# Patient Record
Sex: Female | Born: 1935 | Race: White | Hispanic: No | Smoking: Never smoker
Health system: Southern US, Community
[De-identification: ages and names within clinical notes are randomized; demographics above are authoritative.]

## PROBLEM LIST (undated history)

## (undated) DIAGNOSIS — H9190 Unspecified hearing loss, unspecified ear: Secondary | ICD-10-CM

## (undated) DIAGNOSIS — M8580 Other specified disorders of bone density and structure, unspecified site: Secondary | ICD-10-CM

## (undated) DIAGNOSIS — M1991 Primary osteoarthritis, unspecified site: Secondary | ICD-10-CM

## (undated) DIAGNOSIS — D709 Neutropenia, unspecified: Secondary | ICD-10-CM

## (undated) DIAGNOSIS — E039 Hypothyroidism, unspecified: Secondary | ICD-10-CM

## (undated) DIAGNOSIS — R251 Tremor, unspecified: Secondary | ICD-10-CM

## (undated) HISTORY — DX: Other specified disorders of bone density and structure, unspecified site: M85.80

## (undated) HISTORY — DX: Neutropenia, unspecified: D70.9

## (undated) HISTORY — DX: Unspecified hearing loss, unspecified ear: H91.90

## (undated) HISTORY — DX: Tremor, unspecified: R25.1

## (undated) HISTORY — DX: Primary osteoarthritis, unspecified site: M19.91

## (undated) HISTORY — DX: Hypothyroidism, unspecified: E03.9

---

## 1997-06-10 ENCOUNTER — Other Ambulatory Visit: Admission: RE | Admit: 1997-06-10 | Discharge: 1997-06-10 | Payer: Self-pay | Admitting: *Deleted

## 1997-11-09 ENCOUNTER — Other Ambulatory Visit: Admission: RE | Admit: 1997-11-09 | Discharge: 1997-11-09 | Payer: Self-pay | Admitting: *Deleted

## 1998-08-10 ENCOUNTER — Other Ambulatory Visit: Admission: RE | Admit: 1998-08-10 | Discharge: 1998-08-10 | Payer: Self-pay | Admitting: Endocrinology

## 1999-08-20 ENCOUNTER — Other Ambulatory Visit: Admission: RE | Admit: 1999-08-20 | Discharge: 1999-08-20 | Payer: Self-pay | Admitting: Endocrinology

## 2000-02-15 ENCOUNTER — Encounter: Payer: Self-pay | Admitting: Endocrinology

## 2000-02-15 ENCOUNTER — Ambulatory Visit (HOSPITAL_COMMUNITY): Admission: RE | Admit: 2000-02-15 | Discharge: 2000-02-15 | Payer: Self-pay | Admitting: Endocrinology

## 2004-08-28 ENCOUNTER — Ambulatory Visit: Payer: Self-pay | Admitting: Oncology

## 2004-10-15 ENCOUNTER — Ambulatory Visit (HOSPITAL_COMMUNITY): Admission: RE | Admit: 2004-10-15 | Discharge: 2004-10-15 | Payer: Self-pay | Admitting: *Deleted

## 2005-08-22 ENCOUNTER — Ambulatory Visit: Payer: Self-pay | Admitting: Oncology

## 2005-08-27 LAB — COMPREHENSIVE METABOLIC PANEL
ALT: 9 U/L (ref 0–40)
AST: 18 U/L (ref 0–37)
Albumin: 4.1 g/dL (ref 3.5–5.2)
Alkaline Phosphatase: 65 U/L (ref 39–117)
BUN: 8 mg/dL (ref 6–23)
CO2: 26 mEq/L (ref 19–32)
Calcium: 9.5 mg/dL (ref 8.4–10.5)
Chloride: 100 mEq/L (ref 96–112)
Creatinine, Ser: 0.69 mg/dL (ref 0.40–1.20)
Glucose, Bld: 138 mg/dL — ABNORMAL HIGH (ref 70–99)
Potassium: 4.3 mEq/L (ref 3.5–5.3)
Sodium: 135 mEq/L (ref 135–145)
Total Bilirubin: 0.7 mg/dL (ref 0.3–1.2)
Total Protein: 7.2 g/dL (ref 6.0–8.3)

## 2005-08-27 LAB — CBC WITH DIFFERENTIAL/PLATELET
BASO%: 1.5 % (ref 0.0–2.0)
Basophils Absolute: 0 10*3/uL (ref 0.0–0.1)
EOS%: 2.1 % (ref 0.0–7.0)
Eosinophils Absolute: 0.1 10*3/uL (ref 0.0–0.5)
HCT: 36.1 % (ref 34.8–46.6)
HGB: 12.4 g/dL (ref 11.6–15.9)
LYMPH%: 58.8 % — ABNORMAL HIGH (ref 14.0–48.0)
MCH: 31.9 pg (ref 26.0–34.0)
MCHC: 34.5 g/dL (ref 32.0–36.0)
MCV: 92.3 fL (ref 81.0–101.0)
MONO#: 0.3 10*3/uL (ref 0.1–0.9)
MONO%: 11.9 % (ref 0.0–13.0)
NEUT#: 0.7 10*3/uL — ABNORMAL LOW (ref 1.5–6.5)
NEUT%: 25.7 % — ABNORMAL LOW (ref 39.6–76.8)
Platelets: 313 10*3/uL (ref 145–400)
RBC: 3.91 10*6/uL (ref 3.70–5.32)
RDW: 13.2 % (ref 11.3–14.5)
WBC: 2.7 10*3/uL — ABNORMAL LOW (ref 3.9–10.0)
lymph#: 1.6 10*3/uL (ref 0.9–3.3)

## 2005-08-27 LAB — LACTATE DEHYDROGENASE: LDH: 133 U/L (ref 94–250)

## 2006-08-22 ENCOUNTER — Ambulatory Visit: Payer: Self-pay | Admitting: Oncology

## 2010-05-25 NOTE — Op Note (Signed)
Tanya [name], Tanya [name]                ACCOUNT NO.:  [id number]   MEDICAL RECORD NO.:  [id number]          PATIENT TYPE:  AMB   LOCATION:  ENDO                         FACILITY:  [facility]   PHYSICIAN:  Georgiana Spinner, M.D.    DATE OF BIRTH:  [birth year]   DATE OF PROCEDURE:  10/15/2004  DATE OF DISCHARGE:                                 OPERATIVE REPORT   PROCEDURE:  Colonoscopy.   INDICATIONS FOR PROCEDURE:  Colon cancer screening.   ANESTHESIA:  Fentanyl 50 mcg, Versed 3 mg.   DESCRIPTION OF PROCEDURE:  With the patient mildly sedated in the left  lateral decubitus position, the Olympus videoscopic colonoscope was inserted  in the rectum and passed with some difficulty and shortening of the scope,  we were able to get through a rather diverticular filled tortuous sigmoid  colon. Subsequently, we were able to reach the cecum without pressured  applied to the abdomen. The cecum was identified by the ileocecal valve and  appendiceal orifice both of which were photographed. From this, point, the  colonoscope was slowly withdrawn taking circumferential views of the colonic  mucosa stopping then only when we reached the rectum on pullback which  appeared normal on direct and showed hemorrhoidal tissue on retroflexed  view. The endoscope was straightened and withdrawn. The patient's vital  signs and pulse oximeter remained stable. The patient tolerated the  procedure well without apparent complications.   FINDINGS:  Rather severe diverticulosis with thickening and tortuosity of  the sigmoid colon. Internal hemorrhoid. Otherwise an unremarkable  colonoscopic examination to the cecum.   PLAN:  Consider repeat examination in 5-10 years if indicated.           ______________________________  Georgiana Spinner, M.D.     GMO/MEDQ  D:  10/15/2004  T:  10/15/2004  Job:  [id number]

## 2012-03-27 ENCOUNTER — Telehealth: Payer: Self-pay

## 2012-03-27 NOTE — Telephone Encounter (Signed)
Received labs from Dr Juleen China on this pt. Dr Arline Asp last saw pt 08/27/05 and released pt from care. Faxed labs back with explanation pt released from care.

## 2014-03-14 DIAGNOSIS — Z961 Presence of intraocular lens: Secondary | ICD-10-CM | POA: Diagnosis not present

## 2014-03-14 DIAGNOSIS — H524 Presbyopia: Secondary | ICD-10-CM | POA: Diagnosis not present

## 2014-03-14 DIAGNOSIS — H5213 Myopia, bilateral: Secondary | ICD-10-CM | POA: Diagnosis not present

## 2014-03-14 DIAGNOSIS — H52203 Unspecified astigmatism, bilateral: Secondary | ICD-10-CM | POA: Diagnosis not present

## 2014-04-07 DIAGNOSIS — E0789 Other specified disorders of thyroid: Secondary | ICD-10-CM | POA: Diagnosis not present

## 2014-04-14 DIAGNOSIS — E032 Hypothyroidism due to medicaments and other exogenous substances: Secondary | ICD-10-CM | POA: Diagnosis not present

## 2014-10-06 DIAGNOSIS — E0789 Other specified disorders of thyroid: Secondary | ICD-10-CM | POA: Diagnosis not present

## 2014-10-13 DIAGNOSIS — E039 Hypothyroidism, unspecified: Secondary | ICD-10-CM | POA: Diagnosis not present

## 2014-10-13 DIAGNOSIS — M1990 Unspecified osteoarthritis, unspecified site: Secondary | ICD-10-CM | POA: Diagnosis not present

## 2014-10-13 DIAGNOSIS — D709 Neutropenia, unspecified: Secondary | ICD-10-CM | POA: Diagnosis not present

## 2014-10-13 DIAGNOSIS — Z23 Encounter for immunization: Secondary | ICD-10-CM | POA: Diagnosis not present

## 2014-12-05 DIAGNOSIS — Z0000 Encounter for general adult medical examination without abnormal findings: Secondary | ICD-10-CM | POA: Diagnosis not present

## 2014-12-05 DIAGNOSIS — Z79899 Other long term (current) drug therapy: Secondary | ICD-10-CM | POA: Diagnosis not present

## 2014-12-05 DIAGNOSIS — E0789 Other specified disorders of thyroid: Secondary | ICD-10-CM | POA: Diagnosis not present

## 2014-12-12 DIAGNOSIS — E032 Hypothyroidism due to medicaments and other exogenous substances: Secondary | ICD-10-CM | POA: Diagnosis not present

## 2014-12-12 DIAGNOSIS — M1990 Unspecified osteoarthritis, unspecified site: Secondary | ICD-10-CM | POA: Diagnosis not present

## 2015-05-08 DIAGNOSIS — Z961 Presence of intraocular lens: Secondary | ICD-10-CM | POA: Diagnosis not present

## 2015-06-07 DIAGNOSIS — Z79899 Other long term (current) drug therapy: Secondary | ICD-10-CM | POA: Diagnosis not present

## 2015-06-07 DIAGNOSIS — E0789 Other specified disorders of thyroid: Secondary | ICD-10-CM | POA: Diagnosis not present

## 2015-06-12 DIAGNOSIS — R899 Unspecified abnormal finding in specimens from other organs, systems and tissues: Secondary | ICD-10-CM | POA: Diagnosis not present

## 2015-06-12 DIAGNOSIS — E032 Hypothyroidism due to medicaments and other exogenous substances: Secondary | ICD-10-CM | POA: Diagnosis not present

## 2015-10-02 DIAGNOSIS — Z1231 Encounter for screening mammogram for malignant neoplasm of breast: Secondary | ICD-10-CM | POA: Diagnosis not present

## 2015-12-06 DIAGNOSIS — Z79899 Other long term (current) drug therapy: Secondary | ICD-10-CM | POA: Diagnosis not present

## 2015-12-06 DIAGNOSIS — Z0000 Encounter for general adult medical examination without abnormal findings: Secondary | ICD-10-CM | POA: Diagnosis not present

## 2015-12-06 DIAGNOSIS — E0789 Other specified disorders of thyroid: Secondary | ICD-10-CM | POA: Diagnosis not present

## 2015-12-13 DIAGNOSIS — R899 Unspecified abnormal finding in specimens from other organs, systems and tissues: Secondary | ICD-10-CM | POA: Diagnosis not present

## 2015-12-13 DIAGNOSIS — E032 Hypothyroidism due to medicaments and other exogenous substances: Secondary | ICD-10-CM | POA: Diagnosis not present

## 2016-01-18 DIAGNOSIS — I10 Essential (primary) hypertension: Secondary | ICD-10-CM | POA: Diagnosis not present

## 2016-05-09 DIAGNOSIS — H5310 Unspecified subjective visual disturbances: Secondary | ICD-10-CM | POA: Diagnosis not present

## 2016-05-09 DIAGNOSIS — Z961 Presence of intraocular lens: Secondary | ICD-10-CM | POA: Diagnosis not present

## 2016-06-20 DIAGNOSIS — R51 Headache: Secondary | ICD-10-CM | POA: Diagnosis not present

## 2016-06-20 DIAGNOSIS — I10 Essential (primary) hypertension: Secondary | ICD-10-CM | POA: Diagnosis not present

## 2016-06-20 DIAGNOSIS — E032 Hypothyroidism due to medicaments and other exogenous substances: Secondary | ICD-10-CM | POA: Diagnosis not present

## 2016-07-11 DIAGNOSIS — Z862 Personal history of diseases of the blood and blood-forming organs and certain disorders involving the immune mechanism: Secondary | ICD-10-CM | POA: Diagnosis not present

## 2016-07-11 DIAGNOSIS — M8580 Other specified disorders of bone density and structure, unspecified site: Secondary | ICD-10-CM | POA: Diagnosis not present

## 2016-07-11 DIAGNOSIS — Z7982 Long term (current) use of aspirin: Secondary | ICD-10-CM | POA: Diagnosis not present

## 2016-07-11 DIAGNOSIS — Z23 Encounter for immunization: Secondary | ICD-10-CM | POA: Diagnosis not present

## 2016-07-11 DIAGNOSIS — Z780 Asymptomatic menopausal state: Secondary | ICD-10-CM | POA: Diagnosis not present

## 2016-07-11 DIAGNOSIS — R251 Tremor, unspecified: Secondary | ICD-10-CM | POA: Diagnosis not present

## 2016-07-11 DIAGNOSIS — H905 Unspecified sensorineural hearing loss: Secondary | ICD-10-CM | POA: Diagnosis not present

## 2016-07-11 DIAGNOSIS — E039 Hypothyroidism, unspecified: Secondary | ICD-10-CM | POA: Diagnosis not present

## 2016-07-11 DIAGNOSIS — Z91013 Allergy to seafood: Secondary | ICD-10-CM | POA: Diagnosis not present

## 2016-07-11 DIAGNOSIS — I10 Essential (primary) hypertension: Secondary | ICD-10-CM | POA: Diagnosis not present

## 2016-07-11 DIAGNOSIS — Z833 Family history of diabetes mellitus: Secondary | ICD-10-CM | POA: Diagnosis not present

## 2016-07-11 DIAGNOSIS — Z880 Allergy status to penicillin: Secondary | ICD-10-CM | POA: Diagnosis not present

## 2016-07-11 DIAGNOSIS — M859 Disorder of bone density and structure, unspecified: Secondary | ICD-10-CM | POA: Diagnosis not present

## 2016-07-29 DIAGNOSIS — M8580 Other specified disorders of bone density and structure, unspecified site: Secondary | ICD-10-CM | POA: Diagnosis not present

## 2016-12-12 DIAGNOSIS — E0789 Other specified disorders of thyroid: Secondary | ICD-10-CM | POA: Diagnosis not present

## 2016-12-12 DIAGNOSIS — Z0000 Encounter for general adult medical examination without abnormal findings: Secondary | ICD-10-CM | POA: Diagnosis not present

## 2016-12-19 DIAGNOSIS — E039 Hypothyroidism, unspecified: Secondary | ICD-10-CM | POA: Diagnosis not present

## 2016-12-19 DIAGNOSIS — I10 Essential (primary) hypertension: Secondary | ICD-10-CM | POA: Diagnosis not present

## 2016-12-19 DIAGNOSIS — Z0000 Encounter for general adult medical examination without abnormal findings: Secondary | ICD-10-CM | POA: Diagnosis not present

## 2017-01-15 DIAGNOSIS — D1800 Hemangioma unspecified site: Secondary | ICD-10-CM | POA: Diagnosis not present

## 2017-01-15 DIAGNOSIS — D229 Melanocytic nevi, unspecified: Secondary | ICD-10-CM | POA: Diagnosis not present

## 2017-01-15 DIAGNOSIS — D492 Neoplasm of unspecified behavior of bone, soft tissue, and skin: Secondary | ICD-10-CM | POA: Diagnosis not present

## 2017-01-15 DIAGNOSIS — L821 Other seborrheic keratosis: Secondary | ICD-10-CM | POA: Diagnosis not present

## 2017-06-19 DIAGNOSIS — I10 Essential (primary) hypertension: Secondary | ICD-10-CM | POA: Diagnosis not present

## 2017-06-19 DIAGNOSIS — M25512 Pain in left shoulder: Secondary | ICD-10-CM | POA: Diagnosis not present

## 2017-06-19 DIAGNOSIS — E039 Hypothyroidism, unspecified: Secondary | ICD-10-CM | POA: Diagnosis not present

## 2017-06-25 DIAGNOSIS — M67912 Unspecified disorder of synovium and tendon, left shoulder: Secondary | ICD-10-CM | POA: Diagnosis not present

## 2017-06-25 DIAGNOSIS — M19012 Primary osteoarthritis, left shoulder: Secondary | ICD-10-CM | POA: Diagnosis not present

## 2017-06-25 DIAGNOSIS — M25512 Pain in left shoulder: Secondary | ICD-10-CM | POA: Diagnosis not present

## 2017-08-29 DIAGNOSIS — M545 Low back pain: Secondary | ICD-10-CM | POA: Diagnosis not present

## 2017-08-29 DIAGNOSIS — N390 Urinary tract infection, site not specified: Secondary | ICD-10-CM | POA: Diagnosis not present

## 2017-11-14 DIAGNOSIS — H524 Presbyopia: Secondary | ICD-10-CM | POA: Diagnosis not present

## 2017-11-14 DIAGNOSIS — Z961 Presence of intraocular lens: Secondary | ICD-10-CM | POA: Diagnosis not present

## 2017-11-14 DIAGNOSIS — H52203 Unspecified astigmatism, bilateral: Secondary | ICD-10-CM | POA: Diagnosis not present

## 2017-11-14 DIAGNOSIS — H5211 Myopia, right eye: Secondary | ICD-10-CM | POA: Diagnosis not present

## 2017-12-17 DIAGNOSIS — Z79899 Other long term (current) drug therapy: Secondary | ICD-10-CM | POA: Diagnosis not present

## 2017-12-17 DIAGNOSIS — I10 Essential (primary) hypertension: Secondary | ICD-10-CM | POA: Diagnosis not present

## 2017-12-17 DIAGNOSIS — E0789 Other specified disorders of thyroid: Secondary | ICD-10-CM | POA: Diagnosis not present

## 2017-12-22 DIAGNOSIS — J069 Acute upper respiratory infection, unspecified: Secondary | ICD-10-CM | POA: Diagnosis not present

## 2017-12-25 DIAGNOSIS — I10 Essential (primary) hypertension: Secondary | ICD-10-CM | POA: Diagnosis not present

## 2017-12-25 DIAGNOSIS — Z0000 Encounter for general adult medical examination without abnormal findings: Secondary | ICD-10-CM | POA: Diagnosis not present

## 2017-12-25 DIAGNOSIS — E039 Hypothyroidism, unspecified: Secondary | ICD-10-CM | POA: Diagnosis not present

## 2017-12-25 DIAGNOSIS — J189 Pneumonia, unspecified organism: Secondary | ICD-10-CM | POA: Diagnosis not present

## 2018-01-06 DIAGNOSIS — J159 Unspecified bacterial pneumonia: Secondary | ICD-10-CM | POA: Diagnosis not present

## 2018-01-06 DIAGNOSIS — R05 Cough: Secondary | ICD-10-CM | POA: Diagnosis not present

## 2018-06-15 DIAGNOSIS — E039 Hypothyroidism, unspecified: Secondary | ICD-10-CM | POA: Diagnosis not present

## 2018-06-15 DIAGNOSIS — I10 Essential (primary) hypertension: Secondary | ICD-10-CM | POA: Diagnosis not present

## 2018-06-15 DIAGNOSIS — M19012 Primary osteoarthritis, left shoulder: Secondary | ICD-10-CM | POA: Diagnosis not present

## 2018-06-22 DIAGNOSIS — M19012 Primary osteoarthritis, left shoulder: Secondary | ICD-10-CM | POA: Diagnosis not present

## 2018-08-07 ENCOUNTER — Other Ambulatory Visit: Payer: Self-pay

## 2019-01-07 DIAGNOSIS — I10 Essential (primary) hypertension: Secondary | ICD-10-CM | POA: Diagnosis not present

## 2019-01-07 DIAGNOSIS — E039 Hypothyroidism, unspecified: Secondary | ICD-10-CM | POA: Diagnosis not present

## 2019-01-07 DIAGNOSIS — M8580 Other specified disorders of bone density and structure, unspecified site: Secondary | ICD-10-CM | POA: Diagnosis not present

## 2019-01-14 DIAGNOSIS — Z0000 Encounter for general adult medical examination without abnormal findings: Secondary | ICD-10-CM | POA: Diagnosis not present

## 2019-01-14 DIAGNOSIS — M85851 Other specified disorders of bone density and structure, right thigh: Secondary | ICD-10-CM | POA: Diagnosis not present

## 2019-01-14 DIAGNOSIS — E039 Hypothyroidism, unspecified: Secondary | ICD-10-CM | POA: Diagnosis not present

## 2019-01-14 DIAGNOSIS — L820 Inflamed seborrheic keratosis: Secondary | ICD-10-CM | POA: Diagnosis not present

## 2019-01-14 DIAGNOSIS — M85852 Other specified disorders of bone density and structure, left thigh: Secondary | ICD-10-CM | POA: Diagnosis not present

## 2019-01-14 DIAGNOSIS — M8589 Other specified disorders of bone density and structure, multiple sites: Secondary | ICD-10-CM | POA: Diagnosis not present

## 2019-01-14 DIAGNOSIS — I10 Essential (primary) hypertension: Secondary | ICD-10-CM | POA: Diagnosis not present

## 2019-04-22 DIAGNOSIS — H524 Presbyopia: Secondary | ICD-10-CM | POA: Diagnosis not present

## 2019-04-22 DIAGNOSIS — H5203 Hypermetropia, bilateral: Secondary | ICD-10-CM | POA: Diagnosis not present

## 2019-04-22 DIAGNOSIS — H04123 Dry eye syndrome of bilateral lacrimal glands: Secondary | ICD-10-CM | POA: Diagnosis not present

## 2019-04-22 DIAGNOSIS — H52223 Regular astigmatism, bilateral: Secondary | ICD-10-CM | POA: Diagnosis not present

## 2019-04-22 DIAGNOSIS — Z961 Presence of intraocular lens: Secondary | ICD-10-CM | POA: Diagnosis not present

## 2019-04-22 DIAGNOSIS — H35373 Puckering of macula, bilateral: Secondary | ICD-10-CM | POA: Diagnosis not present

## 2019-04-22 DIAGNOSIS — H543 Unqualified visual loss, both eyes: Secondary | ICD-10-CM | POA: Diagnosis not present

## 2019-04-22 DIAGNOSIS — H1045 Other chronic allergic conjunctivitis: Secondary | ICD-10-CM | POA: Diagnosis not present

## 2019-05-10 ENCOUNTER — Other Ambulatory Visit: Payer: Self-pay | Admitting: Internal Medicine

## 2019-05-10 DIAGNOSIS — G459 Transient cerebral ischemic attack, unspecified: Secondary | ICD-10-CM

## 2019-05-20 ENCOUNTER — Ambulatory Visit
Admission: RE | Admit: 2019-05-20 | Discharge: 2019-05-20 | Disposition: A | Discharge status: Home / Self Care | Payer: PPO | Source: Ambulatory Visit | Attending: Internal Medicine | Admitting: Internal Medicine

## 2019-05-20 DIAGNOSIS — I6782 Cerebral ischemia: Secondary | ICD-10-CM | POA: Diagnosis not present

## 2019-05-20 DIAGNOSIS — G459 Transient cerebral ischemic attack, unspecified: Secondary | ICD-10-CM

## 2019-05-20 DIAGNOSIS — I6523 Occlusion and stenosis of bilateral carotid arteries: Secondary | ICD-10-CM | POA: Diagnosis not present

## 2019-05-25 DIAGNOSIS — H543 Unqualified visual loss, both eyes: Secondary | ICD-10-CM | POA: Diagnosis not present

## 2019-05-25 DIAGNOSIS — H04123 Dry eye syndrome of bilateral lacrimal glands: Secondary | ICD-10-CM | POA: Diagnosis not present

## 2019-05-25 DIAGNOSIS — Z961 Presence of intraocular lens: Secondary | ICD-10-CM | POA: Diagnosis not present

## 2019-06-21 ENCOUNTER — Telehealth: Payer: Self-pay | Admitting: Neurology

## 2019-06-21 ENCOUNTER — Encounter: Payer: Self-pay | Admitting: Neurology

## 2019-06-21 ENCOUNTER — Ambulatory Visit: Payer: PPO | Admitting: Neurology

## 2019-06-21 VITALS — BP 134/70 | HR 75 | Ht 60.0 in | Wt 127.0 lb

## 2019-06-21 DIAGNOSIS — G910 Communicating hydrocephalus: Secondary | ICD-10-CM

## 2019-06-21 DIAGNOSIS — R4701 Aphasia: Secondary | ICD-10-CM

## 2019-06-21 DIAGNOSIS — G459 Transient cerebral ischemic attack, unspecified: Secondary | ICD-10-CM | POA: Diagnosis not present

## 2019-06-21 DIAGNOSIS — G3184 Mild cognitive impairment, so stated: Secondary | ICD-10-CM

## 2019-06-21 MED ORDER — PREVAGEN 10 MG PO CAPS: 1.0000 | ORAL_CAPSULE | Freq: Every morning | ORAL | 1 refills | Status: DC

## 2019-06-21 NOTE — Telephone Encounter (Signed)
health team order sent to GI. No auth they will reach out to the patient to schedule.  

## 2019-06-21 NOTE — Patient Instructions (Signed)
I had a long discussion with the patient and her daughter-in-law regarding her episode of transient speech difficulties and altered awareness and possibly left hemispheric TIA versus complex partial seizure episode and recommend further work-up with checking EEG, MRI scan of the brain, MRA of the brain, lipid profile hemoglobin A1c.  She also has memory difficulties which are likely due to age related mild cognitive impairment and severe check memory panel labs.  Recommend she stay on aspirin for stroke prevention and maintain aggressive risk factor modification with strict control of hypertension with blood pressure goal below 130/90, lipids with LDL cholesterol goal below 70 mg percent and maintain a healthy diet and be active and exercise regularly.  We also discussed memory compensation strategies.  Recommend she start taking Prevagen 10 mg daily to help with memory.  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  I do not believe aggressive testing or treatment for this is necessary at this time.  She will return for follow-up in 2 months or call earlier if necessary, Memory Compensation Strategies  1. Use "WARM" strategy.  W= write it down  A= associate it  R= repeat it  M= make a mental note  2.   You can keep a Social worker.  Use a 3-ring notebook with sections for the following: calendar, important names and phone numbers,  medications, doctors' names/phone numbers, lists/reminders, and a section to journal what you did  each day.   3.    Use a calendar to write appointments down.  4.    Write yourself a schedule for the day.  This can be placed on the calendar or in a separate section of the Memory Notebook.  Keeping a  regular schedule can help memory.  5.    Use medication  organizer with sections for each day or morning/evening pills.  You may need help loading it  6.    Keep a basket, or pegboard by the door.  Place items that you need to take out with you in the basket or on the pegboard.  You may also want to  include a message board for reminders.  7.    Use sticky notes.  Place sticky notes with reminders in a place where the task is performed.  For example: " turn off the  stove" placed by the stove, "lock the door" placed on the door at eye level, " take your medications" on  the bathroom mirror or by the place where you normally take your medications.  8.    Use alarms/timers.  Use while cooking to remind yourself to check on food or as a reminder to take your medicine, or as a  reminder to make a call, or as a reminder to perform another task, etc.

## 2019-06-21 NOTE — Progress Notes (Signed)
[facility] [address] [place]. [place] [address] [phone]       OFFICE CONSULT NOTE  [name] Date of Birth:  04/14/1925 Medical Record Number:  [id number]   Referring MD: [name] Reason for Referral: TIA  HPI: [name] is a pleasant 86 year old Caucasian lady seen today for initial office consultation visit. She is accompanied by her daughter-in-law. History is obtained from them and review of referral notes. I have personally reviewed imaging studies in PACS and electronic medical records. Patient states that on 05/05/2019 she and her granddaughter cleaned out some closets in her house and after that she felt tired and wanted to take a nap in a chair. Her other granddaughter called on the phone and when she was given the phone to speak she had trouble speaking and was talking gibberish and garbled speech. She was also not able to comprehend what granddaughter and daughter-in-law spoke to her. Her symptoms are improving gradually took about 30 minutes. She did not go to the hospital but felt okay for the rest of the day. She was a little slow in thinking for a little while but that improved. She subsequently saw her primary care physician who ordered a CT scan of the head which was done on 05/20/2019 which shows enlarged ventricular size and some periventricular lucencies. 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 There is no acute abnormality. Carotid ultrasound on the same day shows bilateral carotid bifurcation plaques with less than 50% stenosis. Patient denies any known prior history of strokes, TIAs, seizures significant head injury with loss of consciousness. She however does remember his childhood illness possibly diphtheria which she was quite sick and had altered mental status for several days. [place] physician had to go to another city to get her some medications presumably antibiotics which  worked and she gradually got better. She did not have any brain imaging at that time. The patient states she has been having some mild short-term memory difficulties over the years which may be progressing slightly. She does have some sundowning and gets irritated very easily and the daughter-in-law state that she tries to speak to her and get work done mostly in the morning part of the day. Patient denies any symptoms of gait difficulties gait ataxia falls or injuries. She walks quite well with good balance independently and very rarely needs a cane. She is independent and active daily living and recently has stopped driving. She denies any bladder urgency, frequency or incontinence. She has been referred to see neurosurgeon to discuss abnormal CT scan findings and normal pressure hydrocephalus  ROS:   14 system review of systems is positive for speech difficulties, memory loss, confusion, sundowning, irritability, bruising and all other systems negative  PMH:  Past Medical History:  Diagnosis Date  . Hearing loss   . Hypothyroidism   . Neutropenia ([place])   . Osteopenia   . Primary osteoarthritis   . Tremor     Social History:  Social History   Socioeconomic History  . Marital status: Widowed    Spouse name: Not on file  . Number of children: Not on file  . Years of education: Not on file  . Highest education level: Not on file  Occupational History  . Not on file  Tobacco Use  . Smoking status: Never Smoker  . Smokeless tobacco: Never Used  Substance and Sexual Activity  . Alcohol use: Yes  Alcohol/week: 1.0 standard drink    Types: 1 Glasses of wine per week    Comment: bourbon per day one cup prn  . Drug use: Not Currently  . Sexual activity: Not Currently  Other Topics Concern  . Not on file  Social History Narrative  . Not on file   Social Determinants of Health   Financial Resource Strain:   . Difficulty of Paying Living Expenses:   Food Insecurity:   . Worried  About Charity fundraiser in the Last Year:   . Arboriculturist in the Last Year:   Transportation Needs:   . Film/video editor (Medical):   [name] Lack of Transportation (Non-Medical):   Physical Activity:   . Days of Exercise per Week:   . Minutes of Exercise per Session:   Stress:   . Feeling of Stress :   Social Connections:   . Frequency of Communication with Friends and Family:   . Frequency of Social Gatherings with Friends and Family:   . Attends Religious Services:   . Active Member of Clubs or Organizations:   . Attends Archivist Meetings:   [name] Marital Status:   Intimate Partner Violence:   . Fear of Current or Ex-Partner:   . Emotionally Abused:   [name] Physically Abused:   . Sexually Abused:     Medications:   No current outpatient medications on file prior to visit.   No current facility-administered medications on file prior to visit.    Allergies:   Allergies  Allergen Reactions  . Codeine Sulfate [Codeine]   . Shellfish Allergy   . Amoxicillin Itching and Rash    Physical Exam General: Frail elderly Caucasian lady, seated, in no evident distress Head: head normocephalic and atraumatic.   Neck: supple with no carotid or supraclavicular bruits Cardiovascular: regular rate and rhythm, no murmurs Musculoskeletal: no deformity Skin:  no rash few scattered petichiae Vascular:  Normal pulses all extremities  Neurologic Exam Mental Status: Awake and fully alert. Oriented to place and time. Recent and remote memory diminished. Attention span, concentration and fund of knowledge diminished mood and affect appropriate. Recall 1/3. Able to name 10 animals that can walk on 4 legs. Clock drawing 4/4. Able to copy intersecting pentagons well. Cranial Nerves: Fundoscopic exam reveals sharp disc margins. Pupils equal, briskly reactive to light. Extraocular movements full without nystagmus. Visual fields full to confrontation. Hearing diminished bilaterally  despite hearing aid. Facial sensation intact. Face, tongue, palate moves normally and symmetrically.  Motor: Normal bulk and tone. Normal strength in all tested extremity muscles. Sensory.: intact to touch , pinprick , position and vibratory sensation.  Coordination: Rapid alternating movements normal in all extremities. Finger-to-nose and heel-to-shin performed accurately bilaterally. Gait and Station: Arises from chair without difficulty. Stance is normal. Gait demonstrates normal stride length and balance . Able to heel, toe and tandem walk with moderate  difficulty.  Reflexes: 1+ and symmetric. Toes downgoing.   NIHSS 0 Modified Rankin  1   ASSESSMENT: 86 year old pleasant Caucasian lady with transient episode of speech and language difficulties with altered awareness and April 2021 of unclear etiology possibilities include left hemispheric TIA versus complex partial seizure. 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 Her clinical symptomatology does not favor normal pressure hydrocephalus. She also has age-appropriate mild cognitive impairment.     PLAN: I had a long discussion with the  patient and her daughter-in-law regarding her episode of transient speech difficulties and altered awareness and possibly left hemispheric TIA versus complex partial seizure episode and recommend further work-up with checking EEG, MRI scan of the brain, MRA of the brain, lipid profile hemoglobin A1c.  She also has memory difficulties which are likely due to age related mild cognitive impairment and severe check memory panel labs.  Recommend she stay on aspirin for stroke prevention and maintain aggressive risk factor modification with strict control of hypertension with blood pressure goal below 130/90, lipids with LDL cholesterol goal below 70 mg percent and maintain a healthy  diet and be active and exercise regularly.  We also discussed memory compensation strategies.  Recommend she start taking Prevagen 10 mg daily to help with memory.  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  I do not believe aggressive testing or treatment for this is necessary at this time. This was a prolonged 60-minute consultation visit with greater than 50% time spent in counseling and coordination of care about mild cognitive impairment, TIA versus stroke episode and discussion about normal pressure hydrocephalus and answering questions she will return for follow-up in 2 months or call earlier if necessary, Antony Contras, MD  [facility] [address] [place], [place] [address]  Phone [phone] Fax [phone] Note: This document was prepared with digital dictation and possible smart phrase technology. Any transcriptional errors that result from this process are unintentional.

## 2019-06-22 LAB — DEMENTIA PANEL
Homocysteine: 20 umol/L (ref 0.0–21.3)
RPR Ser Ql: NONREACTIVE
TSH: 1.74 u[IU]/mL (ref 0.450–4.500)
Vitamin B-12: 185 pg/mL — ABNORMAL LOW (ref 232–1245)

## 2019-06-22 LAB — HEMOGLOBIN A1C
Est. average glucose Bld gHb Est-mCnc: 117 mg/dL
Hgb A1c MFr Bld: 5.7 % — ABNORMAL HIGH (ref 4.8–5.6)

## 2019-06-22 LAB — LIPID PANEL
Chol/HDL Ratio: 2.7 ratio (ref 0.0–4.4)
Cholesterol, Total: 157 mg/dL (ref 100–199)
HDL: 58 mg/dL (ref >39)
LDL Chol Calc (NIH): 84 mg/dL (ref 0–99)
Triglycerides: 79 mg/dL (ref 0–149)
VLDL Cholesterol Cal: 15 mg/dL (ref 5–40)

## 2019-06-23 DIAGNOSIS — I10 Essential (primary) hypertension: Secondary | ICD-10-CM | POA: Diagnosis not present

## 2019-06-23 DIAGNOSIS — G919 Hydrocephalus, unspecified: Secondary | ICD-10-CM | POA: Diagnosis not present

## 2019-06-29 ENCOUNTER — Other Ambulatory Visit: Payer: Self-pay

## 2019-06-29 ENCOUNTER — Ambulatory Visit
Admission: RE | Admit: 2019-06-29 | Discharge: 2019-06-29 | Disposition: A | Discharge status: Home / Self Care | Payer: PPO | Source: Ambulatory Visit | Attending: Neurology | Admitting: Neurology

## 2019-06-29 DIAGNOSIS — G459 Transient cerebral ischemic attack, unspecified: Secondary | ICD-10-CM | POA: Diagnosis not present

## 2019-06-30 ENCOUNTER — Telehealth (HOSPITAL_COMMUNITY): Payer: Self-pay | Admitting: Neurology

## 2019-06-30 NOTE — Telephone Encounter (Signed)
I called patient to schedule and she has "put her foot down" that she is not going to have anymore testing done.  Order will bre removed  From the WQ and if patient decides to have in the future we can reinstate the order.

## 2019-07-02 ENCOUNTER — Ambulatory Visit
Admission: RE | Admit: 2019-07-02 | Discharge: 2019-07-02 | Disposition: A | Discharge status: Home / Self Care | Payer: PPO | Source: Ambulatory Visit | Attending: Neurology | Admitting: Neurology

## 2019-07-02 DIAGNOSIS — R413 Other amnesia: Secondary | ICD-10-CM | POA: Diagnosis not present

## 2019-07-02 DIAGNOSIS — G3184 Mild cognitive impairment, so stated: Secondary | ICD-10-CM

## 2019-07-02 DIAGNOSIS — G459 Transient cerebral ischemic attack, unspecified: Secondary | ICD-10-CM

## 2019-07-02 MED ORDER — GADOBENATE DIMEGLUMINE 529 MG/ML IV SOLN
11.0000 mL | Freq: Once | INTRAVENOUS | Status: AC | PRN
  Administered 2019-07-02: 11 mL via INTRAVENOUS

## 2019-07-05 NOTE — Telephone Encounter (Signed)
ok 

## 2019-07-08 ENCOUNTER — Telehealth: Payer: Self-pay | Admitting: Neurology

## 2019-07-08 NOTE — Telephone Encounter (Signed)
Patient has declined to have  Echo . Order will be removed from the WQ. Thank you.  Per Cardiology .

## 2019-07-14 DIAGNOSIS — D709 Neutropenia, unspecified: Secondary | ICD-10-CM | POA: Diagnosis not present

## 2019-07-14 DIAGNOSIS — I10 Essential (primary) hypertension: Secondary | ICD-10-CM | POA: Diagnosis not present

## 2019-07-19 ENCOUNTER — Ambulatory Visit: Payer: PPO | Admitting: Neurology

## 2019-07-19 ENCOUNTER — Other Ambulatory Visit: Payer: Self-pay

## 2019-07-19 DIAGNOSIS — R410 Disorientation, unspecified: Secondary | ICD-10-CM | POA: Diagnosis not present

## 2019-07-19 DIAGNOSIS — G3184 Mild cognitive impairment, so stated: Secondary | ICD-10-CM

## 2019-07-20 NOTE — Progress Notes (Signed)
Kindly inform the patient that MRI scan of the brain shows age-related shrinkage of the brain and hardening of the arteries.  There is enlargement of the fluid containing spaces in the brain which is slightly disproportionate but this is likely of very longstanding finding and no further action is needed at this time.

## 2019-07-21 ENCOUNTER — Telehealth: Payer: Self-pay | Admitting: *Deleted

## 2019-07-21 NOTE — Telephone Encounter (Signed)
I have spoken to the patient's dgt-in-law, Marlowe Kays, who verbalized understanding of the MRI brain results.

## 2019-07-21 NOTE — Telephone Encounter (Signed)
-----   Message from Garvin Fila, MD sent at 07/21/2019  7:08 AM EDT ----- Tanya [name] inform patient that EEG study was normal

## 2019-07-21 NOTE — Telephone Encounter (Signed)
-----   Message from Garvin Fila, MD sent at 07/20/2019  9:06 AM EDT ----- Tanya [name] inform the patient that MRI scan of the brain shows age-related shrinkage of the brain and hardening of the arteries.  There is enlargement of the fluid containing spaces in the brain which is slightly disproportionate but this is likely of very longstanding finding and no further action is needed at this time.

## 2019-07-21 NOTE — Progress Notes (Signed)
Kindly inform patient that EEG study was normal

## 2019-07-21 NOTE — Telephone Encounter (Signed)
I have spoken to the patient's daughter-in-law, Marlowe Kays, who verbalized understanding of the EEG results.

## 2019-07-25 NOTE — Progress Notes (Signed)
Kindly inform the patient that lab work for reversible causes of memory loss shows low vitamin B12 levels which are easily correctable and she needs to see primary care physician for treatment for the same.  Cholesterol profile is mostly satisfactory.

## 2019-07-26 ENCOUNTER — Telehealth: Payer: Self-pay | Admitting: *Deleted

## 2019-07-26 NOTE — Telephone Encounter (Signed)
-----   Message from Garvin Fila, MD sent at 07/25/2019  4:44 PM EDT ----- Tanya [name] inform the patient that lab work for reversible causes of memory loss shows low vitamin B12 levels which are easily correctable and she needs to see primary care physician for treatment for the same.  Cholesterol profile is mostly satisfactory.

## 2019-07-26 NOTE — Telephone Encounter (Signed)
I spoke to her daughter-in-law, Marlowe Kays (on [place]). She verbalized understanding of the lab results and will have the patient follow up with her PCP.

## 2019-07-26 NOTE — Progress Notes (Signed)
Kindly inform the patient that MR angiogram study of the brain shows no major blockages of the blood vessels to worry about

## 2019-07-27 DIAGNOSIS — J019 Acute sinusitis, unspecified: Secondary | ICD-10-CM | POA: Diagnosis not present

## 2019-08-25 ENCOUNTER — Encounter: Payer: Self-pay | Admitting: Adult Health

## 2019-08-25 ENCOUNTER — Ambulatory Visit: Payer: PPO | Admitting: Adult Health

## 2019-08-25 VITALS — BP 159/80 | HR 80 | Ht 60.0 in | Wt 126.8 lb

## 2019-08-25 DIAGNOSIS — Z8673 Personal history of transient ischemic attack (TIA), and cerebral infarction without residual deficits: Secondary | ICD-10-CM

## 2019-08-25 DIAGNOSIS — G3184 Mild cognitive impairment, so stated: Secondary | ICD-10-CM | POA: Diagnosis not present

## 2019-08-25 DIAGNOSIS — E538 Deficiency of other specified B group vitamins: Secondary | ICD-10-CM

## 2019-08-25 DIAGNOSIS — G459 Transient cerebral ischemic attack, unspecified: Secondary | ICD-10-CM

## 2019-08-25 NOTE — Patient Instructions (Addendum)
Your Plan:  Recommend use of B12 supplement with dosage 1046mcg - 2026mcg daily Have B12 levels checked by PCP in January   Highly recommend use of memory exercises such as cross word puzzles, word search, card games and reading    Follow up in 6 months or call earlier if needed     Thank you for coming to see [place] at [facility]. I hope we have been able to provide you high quality care today.  You may receive a patient satisfaction survey over the next few weeks. We would appreciate your feedback and comments so that we may continue to improve ourselves and the health of our patients.    Management of Memory Problems  There are some general things you can do to help manage your memory problems.  Your memory may not in fact recover, but by using techniques and strategies you will be able to manage your memory difficulties better.  1)  Establish a routine.  Try to establish and then stick to a regular routine.  By doing this, you will get used to what to expect and you will reduce the need to rely on your memory.  Also, try to do things at the same time of day, such as taking your medication or checking your calendar first thing in the morning.  Think about think that you can do as a part of a regular routine and make a list.  Then enter them into a daily planner to remind you.  This will help you establish a routine.  2)  Organize your environment.  Organize your environment so that it is uncluttered.  Decrease visual stimulation.  Place everyday items such as keys or cell phone in the same place every day (ie.  Basket next to front door)  Use post it notes with a brief message to yourself (ie. Turn off light, lock the door)  Use labels to indicate where things go (ie. Which cupboards are for food, dishes, etc.)  Keep a notepad and pen by the telephone to take messages  3)  Memory Aids  A diary or journal/notebook/daily planner  Making a list (shopping  list, chore list, to do list that needs to be done)  Using an alarm as a reminder (kitchen timer or cell phone alarm)  Using cell phone to store information (Notes, Calendar, Reminders)  Calendar/White board placed in a prominent position  Post-it notes  In order for memory aids to be useful, you need to have good habits.  It's no good remembering to make a note in your journal if you don't remember to look in it.  Try setting aside a certain time of day to look in journal.  4)  Improving mood and managing fatigue.  There may be other factors that contribute to memory difficulties.  Factors, such as anxiety, depression and tiredness can affect memory.  Regular gentle exercise can help improve your mood and give you more energy.  Simple relaxation techniques may help relieve symptoms of anxiety  Try to get back to completing activities or hobbies you enjoyed doing in the past.  Learn to pace yourself through activities to decrease fatigue.  Find out about some local support groups where you can share experiences with others.  Try and achieve 7-8 hours of sleep at night.

## 2019-08-25 NOTE — Progress Notes (Signed)
[facility] [address] [place]. [place] [address] [phone]       OFFICE FOLLOW UP NOTE  [name] Date of Birth:  1925-04-15 Medical Record Number:  [id number]   Referring MD: [name] Reason for Referral: TIA   Chief Complaint  Patient presents with  . Follow-up    2 month f/u. Denies any new issues  . room 5    with daughter in law      HPI:   Today, 08/25/2019, Tanya [name] is being seen for 9-month follow-up accompanied by her daughter in law.   Previously seen by Dr. Leonie Man for transient speech and altered awareness episode of unknown etiology.    She has not had additional transient episodes or new neurological symptoms Cognition has been stable with MMSE today 23/30 Remains on Prevagen tolerating well  Further extensive work-up including EEG, MRI, MRA and lab work completed.   Dementia panel did show B12 deficiency at 79 -advised to follow-up with PCP MR brain w wo contrast showed ventriculomegaly but felt likely chronic with history of meningitis All other work-up unremarkable  No concerns at this time     History provided for reference purposes only Initial consult visit 06/21/2019 Dr. Leonie Man: Tanya [name] is a pleasant 86 year old Caucasian lady seen today for initial office consultation visit. She is accompanied by her daughter-in-law. History is obtained from them and review of referral notes. I have personally reviewed imaging studies in PACS and electronic medical records. Patient states that on 05/05/2019 she and her granddaughter cleaned out some closets in her house and after that she felt tired and wanted to take a nap in a chair. Her other granddaughter called on the phone and when she was given the phone to speak she had trouble speaking and was talking gibberish and garbled speech. She was also not able to comprehend what granddaughter and daughter-in-law spoke to her. Her symptoms are improving gradually took about 30  minutes. She did not go to the hospital but felt okay for the rest of the day. She was a little slow in thinking for a little while but that improved. She subsequently saw her primary care physician who ordered a CT scan of the head which was done on 05/20/2019 which shows enlarged ventricular size and some periventricular lucencies. 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 There is no acute abnormality. Carotid ultrasound on the same day shows bilateral carotid bifurcation plaques with less than 50% stenosis. Patient denies any known prior history of strokes, TIAs, seizures significant head injury with loss of consciousness. She however does remember his childhood illness possibly diphtheria which she was quite sick and had altered mental status for several days. [place] had to go to another city to get her some medications presumably antibiotics which worked and she gradually got better. She did not have any brain imaging at that time. The patient states she has been having some mild short-term memory difficulties over the years which may be progressing slightly. She does have some sundowning and gets irritated very easily and the daughter-in-law state that she tries to speak to her and get work done mostly in the morning part of the day. Patient denies any symptoms of gait difficulties gait ataxia falls or injuries. She walks quite well with good balance independently and very rarely needs a cane. She is independent and active daily living and recently has stopped driving. She denies  any bladder urgency, frequency or incontinence. She has been referred to see neurosurgeon to discuss abnormal CT scan findings and normal pressure hydrocephalus     ROS:   14 system review of systems is positive for memory loss and all other systems negative  PMH:  Past Medical History:  Diagnosis Date  . Hearing loss   . Hypothyroidism   . Neutropenia  ([place])   . Osteopenia   . Primary osteoarthritis   . Tremor     Social History:  Social History   Socioeconomic History  . Marital status: Widowed    Spouse name: Not on file  . Number of children: Not on file  . Years of education: Not on file  . Highest education level: Not on file  Occupational History  . Not on file  Tobacco Use  . Smoking status: Never Smoker  . Smokeless tobacco: Never Used  Substance and Sexual Activity  . Alcohol use: Yes    Alcohol/week: 1.0 standard drink    Types: 1 Glasses of wine per week    Comment: bourbon per day one cup prn  . Drug use: Not Currently  . Sexual activity: Not Currently  Other Topics Concern  . Not on file  Social History Narrative  . Not on file   Social Determinants of Health   Financial Resource Strain:   . Difficulty of Paying Living Expenses:   Food Insecurity:   . Worried About Charity fundraiser in the Last Year:   . Arboriculturist in the Last Year:   Transportation Needs:   . Film/video editor (Medical):   [name] Lack of Transportation (Non-Medical):   Physical Activity:   . Days of Exercise per Week:   . Minutes of Exercise per Session:   Stress:   . Feeling of Stress :   Social Connections:   . Frequency of Communication with Friends and Family:   . Frequency of Social Gatherings with Friends and Family:   . Attends Religious Services:   . Active Member of Clubs or Organizations:   . Attends Archivist Meetings:   [name] Marital Status:   Intimate Partner Violence:   . Fear of Current or Ex-Partner:   . Emotionally Abused:   [name] Physically Abused:   . Sexually Abused:     Medications:   Current Outpatient Medications on File Prior to Visit  Medication Sig Dispense Refill  . amLODipine (NORVASC) 2.5 MG tablet TK 1 T PO QD    . Apoaequorin (PREVAGEN) 10 MG CAPS Take 1 capsule by mouth every morning. 1 capsule 1  . ASPIRIN 81 PO Take 81 mg by mouth.     . levothyroxine (SYNTHROID) 112 MCG  tablet TAKE 1 TABLET ONCE DAILY    . MAGNESIUM OXIDE PO Take 400 mg by mouth. Take 2 capsule daily    . naproxen sodium (ALEVE) 220 MG tablet Take 220 mg by mouth as needed.    [name] VITAMIN D PO Take 2,000 Units by mouth.     No current facility-administered medications on file prior to visit.    Allergies:   Allergies  Allergen Reactions  . Codeine Sulfate [Codeine]   . Shellfish Allergy   . Amoxicillin Itching and Rash    Today's Vitals   08/25/19 1440  BP: (!) 159/80  Pulse: 80  Weight: 126 lb 12.8 oz (57.5 kg)  Height: 5' (1.524 m)   Body mass index is 24.76 kg/m.  Physical Exam General:  Frail pleasant elderly Caucasian lady, seated, in no evident distress Head: head normocephalic and atraumatic.   Neck: supple with no carotid or supraclavicular bruits Cardiovascular: regular rate and rhythm, no murmurs Musculoskeletal: no deformity Skin:  no rash few scattered petichiae Vascular:  Normal pulses all extremities  Neurologic Exam Mental Status: Awake and fully alert. Oriented to place and time. Recent and remote memory diminished. Attention span, concentration and fund of knowledge diminished mood and affect appropriate.  MMSE - Mini Mental State Exam 08/25/2019  Orientation to time 3  Orientation to Place 4  Registration 2  Attention/ Calculation 5  Recall 1  Language- name 2 objects 2  Language- repeat 1  Language- follow 3 step command 3  Language- read & follow direction 1  Write a sentence 1  Copy design 0  Total score 23   Cranial Nerves: Fundoscopic exam reveals sharp disc margins. Pupils equal, briskly reactive to light. Extraocular movements full without nystagmus. Visual fields full to confrontation. Hearing diminished bilaterally despite hearing aid. Facial sensation intact. Face, tongue, palate moves normally and symmetrically.  Motor: Normal bulk and tone. Normal strength in all tested extremity muscles. Sensory.: intact to touch , pinprick , position  and vibratory sensation.  Coordination: Rapid alternating movements normal in all extremities. Finger-to-nose and heel-to-shin performed accurately bilaterally. Gait and Station: Arises from chair without difficulty. Stance is normal. Gait demonstrates normal stride length and balance without use of assistive device Reflexes: 1+ and symmetric. Toes downgoing.      ASSESSMENT/PLAN: 86 year old pleasant Caucasian lady with transient episode of speech and language difficulties with altered awareness in April 2021 of unclear etiology possibilities include left hemispheric TIA versus complex partialseizure. 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 Her clinical symptomatology does not favor normal pressure hydrocephalus. She also has age-appropriate mild cognitive impairment.     Transient speech and altered awareness -No recurrent episodes -Ddx left hemispheric TIA vs complex partial seizures -EEG normal -MR brain w wo contrast: Age-related shrinkage and hardening; enlargement of fluid intake spaces of the brain slightly disproportionate but likely very longstanding finding (hx of meningitis - no further actions recommended by Dr. Leonie Man) -MRA head negative -Declined 2D echo -A1c 5.7 -LDL 84  Mild cognitive impairment -MMSE today 23/30 -B12 189 -recommend initiating B12 supplement and to follow-up with PCP for repeat level in 3 months -Continue Prevagen 10 mg daily -Discussed importance of memory exercises, healthy diet, routine exercise and adequate sleep  History of stroke -Continue aspirin 81 mg daily for secondary stroke -maintain aggressive risk factor modification with strict control of hypertension with blood pressure goal below 130/90, lipids with LDL cholesterol goal below 70 mg percent and maintain a healthy diet and be active and exercise  regularly.     I spent 30 minutes of face-to-face and non-face-to-face time with patient and DIL.  This included previsit chart review, lab review, study review, order entry, electronic health record documentation, patient education regarding prior transient speech and altered awareness, reviewing recent imaging and lab work, review of MMSE and importance of treating B12 deficiency, prior stroke history and importance of managing stroke risk factors and answered all questions to patient and daughter satisfaction   Frann Rider, AGNP-BC  [facility] [address] [place], [place] [address]  Phone [phone] Fax [phone] Note: This document was prepared with digital dictation and possible smart phrase technology. Any transcriptional errors  that result from this process are unintentional.

## 2019-08-27 NOTE — Progress Notes (Signed)
I agree with the above plan 

## 2019-08-31 ENCOUNTER — Ambulatory Visit: Payer: PPO | Admitting: Neurology

## 2019-10-18 DIAGNOSIS — M1611 Unilateral primary osteoarthritis, right hip: Secondary | ICD-10-CM | POA: Diagnosis not present

## 2019-10-18 DIAGNOSIS — M47816 Spondylosis without myelopathy or radiculopathy, lumbar region: Secondary | ICD-10-CM | POA: Diagnosis not present

## 2019-10-18 DIAGNOSIS — S79911A Unspecified injury of right hip, initial encounter: Secondary | ICD-10-CM | POA: Diagnosis not present

## 2019-10-18 DIAGNOSIS — W19XXXA Unspecified fall, initial encounter: Secondary | ICD-10-CM | POA: Diagnosis not present

## 2020-01-13 DIAGNOSIS — M8580 Other specified disorders of bone density and structure, unspecified site: Secondary | ICD-10-CM | POA: Diagnosis not present

## 2020-01-13 DIAGNOSIS — I10 Essential (primary) hypertension: Secondary | ICD-10-CM | POA: Diagnosis not present

## 2020-01-13 DIAGNOSIS — E0789 Other specified disorders of thyroid: Secondary | ICD-10-CM | POA: Diagnosis not present

## 2020-01-13 DIAGNOSIS — Z79899 Other long term (current) drug therapy: Secondary | ICD-10-CM | POA: Diagnosis not present

## 2020-01-13 DIAGNOSIS — E039 Hypothyroidism, unspecified: Secondary | ICD-10-CM | POA: Diagnosis not present

## 2020-01-13 DIAGNOSIS — Z0000 Encounter for general adult medical examination without abnormal findings: Secondary | ICD-10-CM | POA: Diagnosis not present

## 2020-01-18 DIAGNOSIS — Z0000 Encounter for general adult medical examination without abnormal findings: Secondary | ICD-10-CM | POA: Diagnosis not present

## 2020-01-18 DIAGNOSIS — I10 Essential (primary) hypertension: Secondary | ICD-10-CM | POA: Diagnosis not present

## 2020-01-18 DIAGNOSIS — E039 Hypothyroidism, unspecified: Secondary | ICD-10-CM | POA: Diagnosis not present

## 2020-02-28 ENCOUNTER — Encounter: Payer: Self-pay | Admitting: Adult Health

## 2020-02-28 ENCOUNTER — Ambulatory Visit: Payer: PPO | Admitting: Adult Health

## 2020-02-28 VITALS — BP 138/78 | HR 89 | Ht 61.0 in | Wt 129.0 lb

## 2020-02-28 DIAGNOSIS — G459 Transient cerebral ischemic attack, unspecified: Secondary | ICD-10-CM

## 2020-02-28 DIAGNOSIS — G3184 Mild cognitive impairment, so stated: Secondary | ICD-10-CM | POA: Diagnosis not present

## 2020-02-28 DIAGNOSIS — Z8673 Personal history of transient ischemic attack (TIA), and cerebral infarction without residual deficits: Secondary | ICD-10-CM | POA: Diagnosis not present

## 2020-02-28 NOTE — Patient Instructions (Signed)
Your Plan:  No changes made today  - continue current treatment regimen      Thank you for coming to see [place] at [facility]. I hope we have been able to provide you high quality care today.  You may receive a patient satisfaction survey over the next few weeks. We would appreciate your feedback and comments so that we may continue to improve ourselves and the health of our patients.

## 2020-02-28 NOTE — Progress Notes (Signed)
[facility] [address] [place]. [place] [address] [phone]       OFFICE FOLLOW UP NOTE  [name] Tanya [name] [name] Date of Birth:  19-Dec-1925 Medical Record Number:  [id number]   Referring MD: [name] Reason for Referral: TIA   Chief Complaint  Tanya [name] [name] presents with  . Follow-up    TR with Tanya [name] Tanya [name] [name] (connie) Pt is well      HPI:   Today, 02/28/2020, Tanya [name] Tanya [name] [name] returns for 100-month follow-up accompanied by Tanya [name].    No new or reoccurring stroke/TIA symptoms Denies additional transient speech and AMS episodes Cognition stable without worsening and remains on Prevagen  Reports compliance on aspirin 81 mg daily -denies side effects Blood pressure today 138/78 Recent lipid panel showed LDL 84 (01/13/2020) Remains on B12 for B12 deficiency    History provided for reference purposes only Update 08/25/2019 JM: Tanya [name] Tanya [name] [name] is being seen for 58-month follow-up accompanied by her Tanya [name] Tanya [name] [name].   Previously seen by Dr. Leonie Man for transient speech and altered awareness episode of unknown etiology.   She has not had additional transient episodes or new neurological symptoms Cognition has been stable with MMSE today 23/30 Remains on Prevagen tolerating well Further extensive work-up including EEG, MRI, MRA and lab work completed.   Dementia panel did show B12 deficiency at 24 -advised to follow-up with PCP MR brain w wo contrast showed ventriculomegaly but felt likely chronic with history of meningitis All other work-up unremarkable No concerns at this time  Initial consult visit 06/21/2019 Dr. Leonie Man: Tanya [name] Tanya [name] [name] is a pleasant 87 year old Caucasian lady seen today for initial office consultation visit. She is accompanied by her Tanya [name]. History is obtained from them and review of referral notes. I have personally reviewed imaging studies in PACS and electronic medical records. Tanya [name] [name] states that on 05/05/2019 she and her granddaughter  cleaned out some closets in her house and after that she felt tired and wanted to take a nap in a chair. Her other granddaughter called on Tanya [name] phone and when she was given Tanya [name] phone to speak she had trouble speaking and was talking gibberish and garbled speech. She was also not able to comprehend what granddaughter and Tanya [name] spoke to her. Her symptoms are improving gradually took about 30 minutes. She did not go to Tanya [name] hospital but felt okay for Tanya [name] rest of Tanya [name] day. She was a little slow in thinking for a little while but that improved. She subsequently saw her primary care physician who ordered a CT scan of Tanya [name] head which was done on 05/20/2019 which shows enlarged ventricular size and some periventricular lucencies. 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 There is no acute abnormality. Carotid ultrasound on Tanya [name] same day shows bilateral carotid bifurcation plaques with less than 50% stenosis. Tanya [name] [name] denies any known prior history of strokes, TIAs, seizures significant head injury with loss of consciousness. She however does remember his childhood illness possibly diphtheria which she was quite sick and had altered mental status for several days. [name] and Tanya [name] physician had to go to another city to get her some medications presumably antibiotics which worked and she gradually got better. She did not have any brain imaging at that time. Tanya [name] Tanya [name] [name] states she has been having some mild short-term memory difficulties over Tanya [name] years which may be progressing slightly. She does have some sundowning and gets irritated very easily and Tanya [name] Tanya [name] state that she tries to speak to her  and get work done mostly in Tanya [name] morning part of Tanya [name] day. Tanya [name] [name] denies any symptoms of gait difficulties gait ataxia falls or injuries. She walks quite well with good balance independently and very rarely needs a cane. She is independent and active daily living and recently  has stopped driving. She denies any bladder urgency, frequency or incontinence. She has been referred to see neurosurgeon to discuss abnormal CT scan findings and normal pressure hydrocephalus     ROS:   14 system review of systems is positive for memory loss and all other systems negative  PMH:  Past Medical History:  Diagnosis Date  . Hearing loss   . Hypothyroidism   . Neutropenia ([place])   . Osteopenia   . Primary osteoarthritis   . Tremor     Social History:  Social History   Socioeconomic History  . Marital status: Widowed    Spouse name: Not on file  . Number of children: Not on file  . Years of education: Not on file  . Highest education level: Not on file  Occupational History  . Not on file  Tobacco Use  . Smoking status: Never Smoker  . Smokeless tobacco: Never Used  Substance and Sexual Activity  . Alcohol use: Yes    Alcohol/week: 1.0 standard drink    Types: 1 Glasses of wine per week    Comment: bourbon per day one cup prn  . Drug use: Not Currently  . Sexual activity: Not Currently  Other Topics Concern  . Not on file  Social History Narrative  . Not on file   Social Determinants of Health   Financial Resource Strain: Not on file  Food Insecurity: Not on file  Transportation Needs: Not on file  Physical Activity: Not on file  Stress: Not on file  Social Connections: Not on file  Intimate Partner Violence: Not on file    Medications:   Current Outpatient Medications on File Prior to Visit  Medication Sig Dispense Refill  . amLODipine (NORVASC) 2.5 MG tablet TK 1 T PO QD    . Apoaequorin (PREVAGEN) 10 MG CAPS Take 1 capsule by mouth every morning. 1 capsule 1  . ASPIRIN 81 PO Take 81 mg by mouth.     . levothyroxine (SYNTHROID) 112 MCG tablet TAKE 1 TABLET ONCE DAILY    . MAGNESIUM OXIDE PO Take 400 mg by mouth. Take 2 capsule daily    . naproxen sodium (ALEVE) 220 MG tablet Take 220 mg by mouth as needed.    . vitamin B-12 (CYANOCOBALAMIN)  100 MCG tablet     . VITAMIN D PO Take 2,000 Units by mouth.     No current facility-administered medications on file prior to visit.    Allergies:   Allergies  Allergen Reactions  . Codeine Sulfate [Codeine]   . Shellfish Allergy   . Amoxicillin Itching and Rash    Today's Vitals   02/28/20 0930  BP: 138/78  Pulse: 89  Weight: 129 lb (58.5 kg)  Height: 5\' 1"  (1.549 m)   Body mass index is 24.37 kg/m.  Physical Exam General: Frail very pleasant elderly Caucasian lady, seated, in no evident distress Head: head normocephalic and atraumatic.   Neck: supple with no carotid or supraclavicular bruits Cardiovascular: regular rate and rhythm, no murmurs Musculoskeletal: no deformity Skin:  no rash few scattered petichiae Vascular:  Normal pulses all extremities  Neurologic Exam Mental Status: Awake and fully alert.  Fluent speech and language.  Oriented to  place and time. Recent and remote memory diminished. Attention span, concentration and fund of knowledge diminished mood and affect appropriate.  Cranial Nerves: Pupils equal, briskly reactive to light. Extraocular movements full without nystagmus. Visual fields full to confrontation. Hearing diminished bilaterally despite hearing aid. Facial sensation intact. Face, tongue, palate moves normally and symmetrically.  Motor: Normal bulk and tone. Normal strength in all tested extremity muscles. Sensory.: intact to touch , pinprick , position and vibratory sensation.  Coordination: Rapid alternating movements normal in all extremities. Finger-to-nose and heel-to-shin performed accurately bilaterally. Gait and Station: Arises from chair without difficulty. Stance is normal. Gait demonstrates normal stride length and balance with use of cane Reflexes: 1+ and symmetric. Toes downgoing.      ASSESSMENT/PLAN: 87 year old pleasant Caucasian lady with transient episode of speech and language difficulties with altered awareness in April  2021 of unclear etiology possibilities include left hemispheric TIA versus complex partialseizure. 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 Her clinical symptomatology does not favor normal pressure hydrocephalus. She also has age-appropriate mild cognitive impairment.    Hx of TIA vs complex partial seizures -Transient speech and altered awareness -No recurrent episodes -EEG normal -MR brain w wo contrast: Age-related shrinkage and hardening; enlargement of fluid intake spaces of Tanya [name] brain slightly disproportionate but likely very longstanding finding (hx of meningitis - no further actions recommended by Dr. Leonie Man) -MRA head negative -Declined 2D echo -A1c 5.7 -LDL 84  Mild cognitive impairment -Stable without worsening in any ADLs majority of IADLs -B12 189 -recommend initiating B12 supplement and to follow-up with PCP for repeat level in 3 months -Continue Prevagen 10 mg daily per PCP -Discussed importance of memory exercises, healthy diet, routine exercise and adequate sleep  History of stroke -Continue aspirin 81 mg daily for secondary stroke -maintain aggressive risk factor modification with strict control of hypertension with blood pressure goal below 130/90, lipids with LDL cholesterol goal below 70 mg percent and maintain a healthy diet and be active and exercise regularly.     Overall stable from stroke standpoint routinely follows with PCP therefore recommend follow-up on an as-needed basis   CC:  [facility] provider: Dr. Neil Crouch, Thayer Jew, MD    I spent 30 minutes of face-to-face and non-face-to-face time with Tanya [name] [name] and DIL.  This included previsit chart review, lab review, study review, order entry, electronic health record documentation, Tanya [name] [name] education regarding prior transient speech and altered awareness, prior stroke history, mild  cognitive impairment and B12 deficiency, importance of managing stroke risk factors and answered all other questions to Tanya [name] [name] and Tanya [name] in laws [place], [facility]  [facility] [address] [place], [place] [address]  Phone [phone] Fax [phone] Note: This document was prepared with digital dictation and possible smart phrase technology. Any transcriptional errors that result from this process are unintentional.

## 2020-02-29 NOTE — Progress Notes (Signed)
I agree with the above plan 

## 2020-08-12 ENCOUNTER — Inpatient Hospital Stay (HOSPITAL_BASED_OUTPATIENT_CLINIC_OR_DEPARTMENT_OTHER)
Admission: EM | Admit: 2020-08-12 | Discharge: 2020-08-18 | DRG: 480 | Disposition: A | Discharge status: Skilled Nursing Facility | Payer: PPO | Attending: Internal Medicine | Admitting: Internal Medicine

## 2020-08-12 ENCOUNTER — Emergency Department (HOSPITAL_BASED_OUTPATIENT_CLINIC_OR_DEPARTMENT_OTHER): Payer: PPO

## 2020-08-12 ENCOUNTER — Encounter (HOSPITAL_BASED_OUTPATIENT_CLINIC_OR_DEPARTMENT_OTHER): Payer: Self-pay | Admitting: *Deleted

## 2020-08-12 ENCOUNTER — Other Ambulatory Visit: Payer: Self-pay

## 2020-08-12 DIAGNOSIS — Z885 Allergy status to narcotic agent status: Secondary | ICD-10-CM | POA: Diagnosis not present

## 2020-08-12 DIAGNOSIS — I959 Hypotension, unspecified: Secondary | ICD-10-CM | POA: Diagnosis not present

## 2020-08-12 DIAGNOSIS — Z833 Family history of diabetes mellitus: Secondary | ICD-10-CM

## 2020-08-12 DIAGNOSIS — S72141A Displaced intertrochanteric fracture of right femur, initial encounter for closed fracture: Secondary | ICD-10-CM | POA: Diagnosis present

## 2020-08-12 DIAGNOSIS — R29810 Facial weakness: Secondary | ICD-10-CM | POA: Diagnosis not present

## 2020-08-12 DIAGNOSIS — R110 Nausea: Secondary | ICD-10-CM | POA: Diagnosis not present

## 2020-08-12 DIAGNOSIS — Y92019 Unspecified place in single-family (private) house as the place of occurrence of the external cause: Secondary | ICD-10-CM

## 2020-08-12 DIAGNOSIS — M25551 Pain in right hip: Secondary | ICD-10-CM | POA: Diagnosis not present

## 2020-08-12 DIAGNOSIS — Z20822 Contact with and (suspected) exposure to covid-19: Secondary | ICD-10-CM | POA: Diagnosis present

## 2020-08-12 DIAGNOSIS — K5900 Constipation, unspecified: Secondary | ICD-10-CM | POA: Diagnosis not present

## 2020-08-12 DIAGNOSIS — I10 Essential (primary) hypertension: Secondary | ICD-10-CM | POA: Diagnosis present

## 2020-08-12 DIAGNOSIS — S72141D Displaced intertrochanteric fracture of right femur, subsequent encounter for closed fracture with routine healing: Secondary | ICD-10-CM | POA: Diagnosis not present

## 2020-08-12 DIAGNOSIS — I639 Cerebral infarction, unspecified: Secondary | ICD-10-CM | POA: Diagnosis not present

## 2020-08-12 DIAGNOSIS — Z7401 Bed confinement status: Secondary | ICD-10-CM | POA: Diagnosis not present

## 2020-08-12 DIAGNOSIS — R262 Difficulty in walking, not elsewhere classified: Secondary | ICD-10-CM | POA: Diagnosis not present

## 2020-08-12 DIAGNOSIS — S79919A Unspecified injury of unspecified hip, initial encounter: Secondary | ICD-10-CM | POA: Diagnosis not present

## 2020-08-12 DIAGNOSIS — S72141S Displaced intertrochanteric fracture of right femur, sequela: Secondary | ICD-10-CM | POA: Diagnosis not present

## 2020-08-12 DIAGNOSIS — H9190 Unspecified hearing loss, unspecified ear: Secondary | ICD-10-CM | POA: Diagnosis present

## 2020-08-12 DIAGNOSIS — R471 Dysarthria and anarthria: Secondary | ICD-10-CM | POA: Diagnosis not present

## 2020-08-12 DIAGNOSIS — Z880 Allergy status to penicillin: Secondary | ICD-10-CM | POA: Diagnosis not present

## 2020-08-12 DIAGNOSIS — R2681 Unsteadiness on feet: Secondary | ICD-10-CM | POA: Diagnosis not present

## 2020-08-12 DIAGNOSIS — J9601 Acute respiratory failure with hypoxia: Secondary | ICD-10-CM | POA: Diagnosis present

## 2020-08-12 DIAGNOSIS — G9389 Other specified disorders of brain: Secondary | ICD-10-CM | POA: Diagnosis not present

## 2020-08-12 DIAGNOSIS — W19XXXA Unspecified fall, initial encounter: Secondary | ICD-10-CM | POA: Diagnosis not present

## 2020-08-12 DIAGNOSIS — S79929A Unspecified injury of unspecified thigh, initial encounter: Secondary | ICD-10-CM | POA: Diagnosis not present

## 2020-08-12 DIAGNOSIS — Z8673 Personal history of transient ischemic attack (TIA), and cerebral infarction without residual deficits: Secondary | ICD-10-CM

## 2020-08-12 DIAGNOSIS — E538 Deficiency of other specified B group vitamins: Secondary | ICD-10-CM | POA: Diagnosis present

## 2020-08-12 DIAGNOSIS — Z79890 Hormone replacement therapy (postmenopausal): Secondary | ICD-10-CM | POA: Diagnosis not present

## 2020-08-12 DIAGNOSIS — E039 Hypothyroidism, unspecified: Secondary | ICD-10-CM | POA: Diagnosis present

## 2020-08-12 DIAGNOSIS — R41841 Cognitive communication deficit: Secondary | ICD-10-CM | POA: Diagnosis not present

## 2020-08-12 DIAGNOSIS — R112 Nausea with vomiting, unspecified: Secondary | ICD-10-CM | POA: Diagnosis not present

## 2020-08-12 DIAGNOSIS — S72144A Nondisplaced intertrochanteric fracture of right femur, initial encounter for closed fracture: Secondary | ICD-10-CM | POA: Diagnosis not present

## 2020-08-12 DIAGNOSIS — W010XXA Fall on same level from slipping, tripping and stumbling without subsequent striking against object, initial encounter: Secondary | ICD-10-CM | POA: Diagnosis present

## 2020-08-12 DIAGNOSIS — Z7982 Long term (current) use of aspirin: Secondary | ICD-10-CM | POA: Diagnosis not present

## 2020-08-12 DIAGNOSIS — R5381 Other malaise: Secondary | ICD-10-CM | POA: Diagnosis not present

## 2020-08-12 DIAGNOSIS — M1991 Primary osteoarthritis, unspecified site: Secondary | ICD-10-CM | POA: Diagnosis present

## 2020-08-12 DIAGNOSIS — Z79899 Other long term (current) drug therapy: Secondary | ICD-10-CM

## 2020-08-12 DIAGNOSIS — I6389 Other cerebral infarction: Secondary | ICD-10-CM | POA: Diagnosis not present

## 2020-08-12 DIAGNOSIS — S72001A Fracture of unspecified part of neck of right femur, initial encounter for closed fracture: Secondary | ICD-10-CM

## 2020-08-12 DIAGNOSIS — G3184 Mild cognitive impairment, so stated: Secondary | ICD-10-CM | POA: Diagnosis present

## 2020-08-12 DIAGNOSIS — S72009A Fracture of unspecified part of neck of unspecified femur, initial encounter for closed fracture: Secondary | ICD-10-CM | POA: Diagnosis present

## 2020-08-12 DIAGNOSIS — R9431 Abnormal electrocardiogram [ECG] [EKG]: Secondary | ICD-10-CM | POA: Diagnosis not present

## 2020-08-12 DIAGNOSIS — I6502 Occlusion and stenosis of left vertebral artery: Secondary | ICD-10-CM | POA: Diagnosis not present

## 2020-08-12 DIAGNOSIS — M8580 Other specified disorders of bone density and structure, unspecified site: Secondary | ICD-10-CM | POA: Diagnosis present

## 2020-08-12 DIAGNOSIS — M1990 Unspecified osteoarthritis, unspecified site: Secondary | ICD-10-CM | POA: Diagnosis not present

## 2020-08-12 DIAGNOSIS — R1110 Vomiting, unspecified: Secondary | ICD-10-CM | POA: Diagnosis not present

## 2020-08-12 DIAGNOSIS — Z4889 Encounter for other specified surgical aftercare: Secondary | ICD-10-CM | POA: Diagnosis not present

## 2020-08-12 DIAGNOSIS — M6281 Muscle weakness (generalized): Secondary | ICD-10-CM | POA: Diagnosis not present

## 2020-08-12 DIAGNOSIS — I6523 Occlusion and stenosis of bilateral carotid arteries: Secondary | ICD-10-CM | POA: Diagnosis not present

## 2020-08-12 DIAGNOSIS — Z419 Encounter for procedure for purposes other than remedying health state, unspecified: Secondary | ICD-10-CM

## 2020-08-12 DIAGNOSIS — Z4789 Encounter for other orthopedic aftercare: Secondary | ICD-10-CM | POA: Diagnosis not present

## 2020-08-12 DIAGNOSIS — G319 Degenerative disease of nervous system, unspecified: Secondary | ICD-10-CM | POA: Diagnosis not present

## 2020-08-12 LAB — CBC WITH DIFFERENTIAL/PLATELET
Abs Immature Granulocytes: 0 10*3/uL (ref 0.00–0.07)
Basophils Absolute: 0.1 10*3/uL (ref 0.0–0.1)
Basophils Relative: 1 %
Eosinophils Absolute: 0.1 10*3/uL (ref 0.0–0.5)
Eosinophils Relative: 2 %
HCT: 37.2 % (ref 36.0–46.0)
Hemoglobin: 12.1 g/dL (ref 12.0–15.0)
Immature Granulocytes: 0 %
Lymphocytes Relative: 21 %
Lymphs Abs: 1 10*3/uL (ref 0.7–4.0)
MCH: 28.1 pg (ref 26.0–34.0)
MCHC: 32.5 g/dL (ref 30.0–36.0)
MCV: 86.5 fL (ref 80.0–100.0)
Monocytes Absolute: 0.7 10*3/uL (ref 0.1–1.0)
Monocytes Relative: 14 %
Neutro Abs: 3.1 10*3/uL (ref 1.7–7.7)
Neutrophils Relative %: 62 %
Platelets: 356 10*3/uL (ref 150–400)
RBC: 4.3 MIL/uL (ref 3.87–5.11)
RDW: 14.6 % (ref 11.5–15.5)
WBC: 4.9 10*3/uL (ref 4.0–10.5)
nRBC: 0 % (ref 0.0–0.2)

## 2020-08-12 LAB — BASIC METABOLIC PANEL
Anion gap: 10 (ref 5–15)
BUN: 11 mg/dL (ref 8–23)
CO2: 27 mmol/L (ref 22–32)
Calcium: 9.1 mg/dL (ref 8.9–10.3)
Chloride: 95 mmol/L — ABNORMAL LOW (ref 98–111)
Creatinine, Ser: 0.65 mg/dL (ref 0.44–1.00)
GFR, Estimated: >60 mL/min (ref >60)
Glucose, Bld: 101 mg/dL — ABNORMAL HIGH (ref 70–99)
Potassium: 3.9 mmol/L (ref 3.5–5.1)
Sodium: 132 mmol/L — ABNORMAL LOW (ref 135–145)

## 2020-08-12 LAB — SURGICAL PCR SCREEN
MRSA, PCR: NEGATIVE
Staphylococcus aureus: NEGATIVE

## 2020-08-12 LAB — RESP PANEL BY RT-PCR (FLU A&B, COVID) ARPGX2
Influenza A by PCR: NEGATIVE
Influenza B by PCR: NEGATIVE
SARS Coronavirus 2 by RT PCR: NEGATIVE

## 2020-08-12 LAB — PROTIME-INR
INR: 1 (ref 0.8–1.2)
Prothrombin Time: 13.4 seconds (ref 11.4–15.2)

## 2020-08-12 MED ORDER — MORPHINE SULFATE (PF) 2 MG/ML IV SOLN
2.0000 mg | INTRAVENOUS | Status: DC | PRN
  Administered 2020-08-12 – 2020-08-13 (×2): 2 mg via INTRAVENOUS
  Filled 2020-08-12 (×2): qty 1

## 2020-08-12 MED ORDER — FENTANYL CITRATE (PF) 100 MCG/2ML IJ SOLN
25.0000 ug | Freq: Once | INTRAMUSCULAR | Status: AC
  Administered 2020-08-12: 25 ug via INTRAVENOUS

## 2020-08-12 MED ORDER — PROCHLORPERAZINE EDISYLATE 10 MG/2ML IJ SOLN: 10.0000 mg | INTRAMUSCULAR | Status: DC | PRN

## 2020-08-12 MED ORDER — AMLODIPINE BESYLATE 5 MG PO TABS
2.5000 mg | ORAL_TABLET | Freq: Every evening | ORAL | Status: DC
  Administered 2020-08-12 – 2020-08-16 (×4): 2.5 mg via ORAL
  Filled 2020-08-12 (×4): qty 1

## 2020-08-12 MED ORDER — LEVOTHYROXINE SODIUM 112 MCG PO TABS
112.0000 ug | ORAL_TABLET | Freq: Every day | ORAL | Status: DC
  Administered 2020-08-14: 112 ug via ORAL
  Filled 2020-08-12 (×2): qty 1

## 2020-08-12 MED ORDER — ONDANSETRON HCL 4 MG/2ML IJ SOLN
4.0000 mg | Freq: Once | INTRAMUSCULAR | Status: AC
  Administered 2020-08-12: 4 mg via INTRAVENOUS
  Filled 2020-08-12: qty 2

## 2020-08-12 MED ORDER — ONDANSETRON HCL 4 MG/2ML IJ SOLN
4.0000 mg | Freq: Four times a day (QID) | INTRAMUSCULAR | Status: DC | PRN
  Administered 2020-08-12 – 2020-08-17 (×4): 4 mg via INTRAVENOUS
  Filled 2020-08-12 (×4): qty 2

## 2020-08-12 MED ORDER — ACETAMINOPHEN 325 MG PO TABS
650.0000 mg | ORAL_TABLET | ORAL | Status: DC | PRN
  Administered 2020-08-15 – 2020-08-17 (×2): 650 mg via ORAL
  Filled 2020-08-12 (×2): qty 2

## 2020-08-12 MED ORDER — FENTANYL CITRATE (PF) 100 MCG/2ML IJ SOLN
50.0000 ug | Freq: Once | INTRAMUSCULAR | Status: AC
  Administered 2020-08-12: 50 ug via INTRAVENOUS
  Filled 2020-08-12: qty 2

## 2020-08-12 MED ORDER — SODIUM CHLORIDE 0.9% FLUSH
3.0000 mL | Freq: Two times a day (BID) | INTRAVENOUS | Status: DC
  Administered 2020-08-12 – 2020-08-18 (×9): 3 mL via INTRAVENOUS

## 2020-08-12 MED ORDER — FENTANYL CITRATE (PF) 100 MCG/2ML IJ SOLN
50.0000 ug | INTRAMUSCULAR | Status: DC | PRN
  Administered 2020-08-12: 25 ug via INTRAVENOUS
  Filled 2020-08-12 (×2): qty 2

## 2020-08-12 NOTE — H&P (View-Only) (Signed)
Chief Complaint: Mechanical fall resulting in right hip fracture   history: Patient is a 87 year old female who presents after a fall.  She says that she was trying to squash a bug that was on the floor and she fell over landing on her right side.  She has pain to her right hip.  She denied hitting her head.  No neck or back pain.  She is not on anticoagulants.  She denies any other injuries from the fall.  She says it hurts whenever she moves her right hip.  She has had constant pain since the incident just prior to arrival.  Review of systems: No nausea vomiting or diarrhea.  No loss of consciousness/headaches/blurry vision.  Past Medical History:  Diagnosis Date   Hearing loss    Hypothyroidism    Neutropenia (HCC)    Osteopenia    Primary osteoarthritis    Tremor     Allergies  Allergen Reactions   Codeine Other (See Comments)    Extreme headaches   Shellfish Allergy Hives   Shellfish-Derived Products Hives   Amoxicillin Itching and Rash    No current facility-administered medications on file prior to encounter.   Current Outpatient Medications on File Prior to Encounter  Medication Sig Dispense Refill   amLODipine (NORVASC) 2.5 MG tablet Take 2.5 mg by mouth every evening.     Apoaequorin (PREVAGEN) 10 MG CAPS Take 1 capsule by mouth every morning. (Patient taking differently: Take 10 mg by mouth every morning.) 1 capsule 1   ASPIRIN 81 PO Take 81 mg by mouth daily in the afternoon.     Cholecalciferol (VITAMIN D3) 50 MCG (2000 UT) TABS Take 2,000 Units by mouth daily.     levothyroxine (SYNTHROID) 112 MCG tablet Take 112 mcg by mouth daily before breakfast.     magnesium oxide (MAG-OX) 400 MG tablet Take 400 mg by mouth daily.     naproxen sodium (ALEVE) 220 MG tablet Take 220 mg by mouth 2 (two) times daily as needed (for pain).     vitamin B-12 (CYANOCOBALAMIN) 100 MCG tablet Take 100 mcg by mouth daily.      Physical Exam: Vitals:   08/12/20 1430  08/12/20 1500  BP: (!) 157/75 (!) 157/77  Pulse: (!) 105 84  Resp: 15 18  Temp:  97.6 F (36.4 C)  SpO2: 96% 98%   Body mass index is 25.2 kg/m. Alert and oriented x3. No shortness of breath or chest pain.  Lungs are clear to auscultation bilaterally Abdomen soft and nontender.  No rebound tenderness/distention.  No incontinence of bowel and bladder Full range of motion with no gross crepitus deformity or pain in the upper extremity. Right lower extremity: Significant pain and tenderness over the right hip.  No knee or ankle pain.  Compartments are soft and nontender.  2+ dorsalis pedis/posterior tibialis pulses bilaterally. EHL/tibialis anterior/gastrocnemius are intact bilaterally.  Normal sensation light touch throughout the lower extremities.  Image: DG Hip Unilat  With Pelvis 2-3 Views Right  Result Date: 08/12/2020 CLINICAL DATA:  Acute RIGHT hip pain following fall. EXAM: DG HIP (WITH OR WITHOUT PELVIS) 2-3V RIGHT COMPARISON:  None. FINDINGS: An intertrochanteric fracture of the proximal RIGHT femur is identified with slight varus angulation. No dislocation noted. Degenerative changes in the hips and LOWER lumbar spine are present. IMPRESSION: Intertrochanteric RIGHT femur fracture with slight varus angulation. Electronically Signed   By: Margarette Canada M.D.   On: 08/12/2020 11:44  A/P: Daneil Dan is a very pleasant 87 year old man who unfortunately had a mechanical fall today and noted significant pain and loss in the ability to ambulate.  Patient was evaluated and transferred from the med center droppage because of a right intertrochanteric hip fracture.  Patient is hemodynamically stable with no signs or symptoms of a compartment syndrome.  I discussed her care with Dr. Wynelle Link my partner and he will plan on definitive fixation tomorrow.  Patient will remain n.p.o. after midnight tonight.

## 2020-08-12 NOTE — Assessment & Plan Note (Addendum)
-   seen by Dr. Leonie Man on 02/28/20 - on aspirin 81 mg daily for secondary stroke prevention; on hold in anticipation of surgery; see fracture

## 2020-08-12 NOTE — Assessment & Plan Note (Signed)
-   continue amlodipine. 

## 2020-08-12 NOTE — ED Notes (Signed)
Report given to Denyse Amass RN at [facility] Report given to [facility]

## 2020-08-12 NOTE — Progress Notes (Signed)
Notified by EDP of need for admission d/t hip fx. [facility] accepts patient to med-surg at [facility]. EDP is to remain responsible for orders/medical decisions while patient is holding at [facility]. Upon arrival to [facility], [facility] will assume care. Nursing staff will call patient placement to notify them of patient's arrival so that the proper [facility] member may receive the patient. Nursing staff will notify the following consultants, Dr. Katha Cabal, of patient's arrival for their evaluation. Thank you.

## 2020-08-12 NOTE — ED Provider Notes (Signed)
[facility] EMERGENCY DEPT Provider Note   CSN: [id number] Arrival date & time: 08/12/20  1045     History Chief Complaint  Patient presents with   Leg Injury    Tanya [name] is a 87 y.o. female.  Patient is a 87 year old female who presents after a fall.  She says that she was trying to squash a bug that was on the floor and she fell over landing on her right side.  She has pain to her right hip.  She denied hitting her head.  No neck or back pain.  She is not on anticoagulants.  She denies any other injuries from the fall.  She says it hurts whenever she moves her right hip.  She has had constant pain since the incident just prior to arrival.      Past Medical History:  Diagnosis Date   Hearing loss    Hypothyroidism    Neutropenia (HCC)    Osteopenia    Primary osteoarthritis    Tremor     Patient Active Problem List   Diagnosis Date Noted   Hip fracture ([place]) 08/12/2020    History reviewed. No pertinent surgical history.   OB History   No obstetric history on file.     No family history on file.  Social History   Tobacco Use   Smoking status: Never   Smokeless tobacco: Never  Substance Use Topics   Alcohol use: Yes    Alcohol/week: 1.0 standard drink    Types: 1 Glasses of wine per week    Comment: bourbon per day one cup prn   Drug use: Not Currently    Home Medications Prior to Admission medications   Medication Sig Start Date End Date Taking? Authorizing Provider  amLODipine (NORVASC) 2.5 MG tablet TK 1 T PO QD 09/15/17   [provider]  Apoaequorin (PREVAGEN) 10 MG CAPS Take 1 capsule by mouth every morning. 06/21/19   Garvin Fila, MD  ASPIRIN 81 PO Take 81 mg by mouth.     [provider]  levothyroxine (SYNTHROID) 112 MCG tablet TAKE 1 TABLET ONCE DAILY 02/02/16   [provider]  MAGNESIUM OXIDE PO Take 400 mg by mouth. Take 2 capsule daily    [provider]  naproxen sodium (ALEVE) 220  MG tablet Take 220 mg by mouth as needed.    [provider]  vitamin B-12 (CYANOCOBALAMIN) 100 MCG tablet     [provider]  VITAMIN D PO Take 2,000 Units by mouth.    [provider]    Allergies    Codeine sulfate [codeine], Shellfish allergy, and Amoxicillin  Review of Systems   Review of Systems  Constitutional:  Negative for chills, diaphoresis, fatigue and fever.  HENT:  Negative for congestion, rhinorrhea and sneezing.   Eyes: Negative.   Respiratory:  Negative for cough, chest tightness and shortness of breath.   Cardiovascular:  Negative for chest pain and leg swelling.  Gastrointestinal:  Negative for abdominal pain, blood in stool, diarrhea, nausea and vomiting.  Genitourinary:  Negative for difficulty urinating, flank pain, frequency and hematuria.  Musculoskeletal:  Positive for arthralgias. Negative for back pain.  Skin:  Negative for rash.  Neurological:  Negative for dizziness, speech difficulty, weakness, numbness and headaches.   Physical Exam Updated Vital Signs BP (!) 150/72   Pulse 93   Temp 97.6 F (36.4 C) (Oral)   Resp 14   Ht '5\' 2"'$  (1.575 m) Comment: Simultaneous  filing. User may not have seen previous data.  Wt 64 kg Comment: Simultaneous filing. User may not have seen previous data.  SpO2 98%   BMI 25.81 kg/m   Physical Exam Constitutional:      Appearance: She is well-developed.  HENT:     Head: Normocephalic and atraumatic.  Eyes:     Pupils: Pupils are equal, round, and reactive to light.  Neck:     Comments: No pain on palpation of the cervical, thoracic or lumbosacral spine Cardiovascular:     Rate and Rhythm: Normal rate and regular rhythm.     Heart sounds: Normal heart sounds.  Pulmonary:     Effort: Pulmonary effort is normal. No respiratory distress.     Breath sounds: Normal breath sounds. No wheezing or rales.  Chest:     Chest wall: No tenderness.  Abdominal:     General: Bowel sounds are normal.      Palpations: Abdomen is soft.     Tenderness: There is no abdominal tenderness. There is no guarding or rebound.  Musculoskeletal:        General: Normal range of motion.     Cervical back: Normal range of motion and neck supple.     Comments: Tenderness to her right hip.  There is no pain to the knee or ankle.  Pedal pulses are intact.  She has normal sensation distally.  Lymphadenopathy:     Cervical: No cervical adenopathy.  Skin:    General: Skin is warm and dry.     Findings: No rash.  Neurological:     Mental Status: She is alert and oriented to person, place, and time.    ED Results / Procedures / Treatments   Labs (all labs ordered are listed, but only abnormal results are displayed) Labs Reviewed  BASIC METABOLIC PANEL - Abnormal; Notable for the following components:      Result Value   Sodium 132 (*)    Chloride 95 (*)    Glucose, Bld 101 (*)    All other components within normal limits  RESP PANEL BY RT-PCR (FLU A&B, COVID) ARPGX2  CBC WITH DIFFERENTIAL/PLATELET  PROTIME-INR  TYPE AND SCREEN    EKG EKG Interpretation  Date/Time:  Saturday August 12 2020 12:26:51 EDT Ventricular Rate:  91 PR Interval:  167 QRS Duration: 100 QT Interval:  363 QTC Calculation: 447 R Axis:   50 Text Interpretation: Sinus rhythm Anterior infarct, old No old tracing to compare Confirmed by Malvin Johns [phone]) on 08/12/2020 1:06:50 PM  Radiology DG Hip Unilat  With Pelvis 2-3 Views Right  Result Date: 08/12/2020 CLINICAL DATA:  Acute RIGHT hip pain following fall. EXAM: DG HIP (WITH OR WITHOUT PELVIS) 2-3V RIGHT COMPARISON:  None. FINDINGS: An intertrochanteric fracture of the proximal RIGHT femur is identified with slight varus angulation. No dislocation noted. Degenerative changes in the hips and LOWER lumbar spine are present. IMPRESSION: Intertrochanteric RIGHT femur fracture with slight varus angulation. Electronically Signed   By: Margarette Canada M.D.   On: 08/12/2020 11:44     Procedures Procedures   Medications Ordered in ED Medications  fentaNYL (SUBLIMAZE) injection 50 mcg (25 mcg Intravenous Given 08/12/20 1310)  fentaNYL (SUBLIMAZE) injection 50 mcg (50 mcg Intravenous Given 08/12/20 1145)  ondansetron (ZOFRAN) injection 4 mg (4 mg Intravenous Given 08/12/20 1310)    ED Course  I have reviewed the triage vital signs and the nursing notes.  Pertinent labs & imaging results that were available during my care  of the patient were reviewed by me and considered in my medical decision making (see chart for details).    MDM Rules/Calculators/A&P                           Patient is 87 year old female who presents with pain in her right hip after mechanical fall.  She has evidence of intertrochanteric hip fracture.  There is no associated pain in her back.  No palpable tenderness along her spine.  No other injuries were identified.  She denies any head trauma with the fall.  Her labs are nonconcerning.  I spoke with Dr. Rolena Infante with orthopedic surgery.  He states that he will see the patient and request to the hospitalist call him when the patient arrives to room.  He does not plan on doing surgery today so patient can eat today.  I spoke with Dr. Marylyn Ishihara with the hospitalist service who will admit the patient for further treatment. Final Clinical Impression(s) / ED Diagnoses Final diagnoses:  Closed fracture of right hip, initial encounter [facility])    Rx / [place] Orders ED Discharge Orders     None        Malvin Johns, MD 08/12/20 1334

## 2020-08-12 NOTE — Assessment & Plan Note (Addendum)
-   check TSH (24.653); confirmed with patient she is taking properly and is compliant - check FT4 (0.6, low) and TT3 (still in process) - increase synthroid from 112 to 125 mcg daily; discussed with daughter and patient bedside  - repeat TSH in 4-6 weeks

## 2020-08-12 NOTE — Plan of Care (Signed)
?  Problem: Education: ?Goal: Knowledge of General Education information will improve ?Description: Including pain rating scale, medication(s)/side effects and non-pharmacologic comfort measures ?Outcome: Progressing ?  ?Problem: Safety: ?Goal: Ability to remain free from injury will improve ?Outcome: Progressing ?  ?Problem: Pain Managment: ?Goal: General experience of comfort will improve ?Outcome: Progressing ?  ?

## 2020-08-12 NOTE — ED Triage Notes (Signed)
BIB EMS after trip and fall this am. Pt did not show injury on scene. Good pulse Skin warm and dry. ? Upper femur deformity

## 2020-08-12 NOTE — ED Notes (Signed)
Error in charting Type and Screen not collected

## 2020-08-12 NOTE — Assessment & Plan Note (Addendum)
-   last B12 level 185 in 06/21/19 - on B12 supplement at home - check B12 level (1201) and will continue in hospital

## 2020-08-12 NOTE — Consult Note (Signed)
Chief Complaint: Mechanical fall resulting in right hip fracture   history: Patient is a 87 year old female who presents after a fall.  She says that she was trying to squash a bug that was on the floor and she fell over landing on her right side.  She has pain to her right hip.  She denied hitting her head.  No neck or back pain.  She is not on anticoagulants.  She denies any other injuries from the fall.  She says it hurts whenever she moves her right hip.  She has had constant pain since the incident just prior to arrival.  Review of systems: No nausea vomiting or diarrhea.  No loss of consciousness/headaches/blurry vision.  Past Medical History:  Diagnosis Date   Hearing loss    Hypothyroidism    Neutropenia (HCC)    Osteopenia    Primary osteoarthritis    Tremor     Allergies  Allergen Reactions   Codeine Other (See Comments)    Extreme headaches   Shellfish Allergy Hives   Shellfish-Derived Products Hives   Amoxicillin Itching and Rash    No current facility-administered medications on file prior to encounter.   Current Outpatient Medications on File Prior to Encounter  Medication Sig Dispense Refill   amLODipine (NORVASC) 2.5 MG tablet Take 2.5 mg by mouth every evening.     Apoaequorin (PREVAGEN) 10 MG CAPS Take 1 capsule by mouth every morning. (Patient taking differently: Take 10 mg by mouth every morning.) 1 capsule 1   ASPIRIN 81 PO Take 81 mg by mouth daily in the afternoon.     Cholecalciferol (VITAMIN D3) 50 MCG (2000 UT) TABS Take 2,000 Units by mouth daily.     levothyroxine (SYNTHROID) 112 MCG tablet Take 112 mcg by mouth daily before breakfast.     magnesium oxide (MAG-OX) 400 MG tablet Take 400 mg by mouth daily.     naproxen sodium (ALEVE) 220 MG tablet Take 220 mg by mouth 2 (two) times daily as needed (for pain).     vitamin B-12 (CYANOCOBALAMIN) 100 MCG tablet Take 100 mcg by mouth daily.      Physical Exam: Vitals:   08/12/20 1430  08/12/20 1500  BP: (!) 157/75 (!) 157/77  Pulse: (!) 105 84  Resp: 15 18  Temp:  97.6 F (36.4 C)  SpO2: 96% 98%   Body mass index is 25.2 kg/m. Alert and oriented x3. No shortness of breath or chest pain.  Lungs are clear to auscultation bilaterally Abdomen soft and nontender.  No rebound tenderness/distention.  No incontinence of bowel and bladder Full range of motion with no gross crepitus deformity or pain in the upper extremity. Right lower extremity: Significant pain and tenderness over the right hip.  No knee or ankle pain.  Compartments are soft and nontender.  2+ dorsalis pedis/posterior tibialis pulses bilaterally. EHL/tibialis anterior/gastrocnemius are intact bilaterally.  Normal sensation light touch throughout the lower extremities.  Image: DG Hip Unilat  With Pelvis 2-3 Views Right  Result Date: 08/12/2020 CLINICAL DATA:  Acute RIGHT hip pain following fall. EXAM: DG HIP (WITH OR WITHOUT PELVIS) 2-3V RIGHT COMPARISON:  None. FINDINGS: An intertrochanteric fracture of the proximal RIGHT femur is identified with slight varus angulation. No dislocation noted. Degenerative changes in the hips and LOWER lumbar spine are present. IMPRESSION: Intertrochanteric RIGHT femur fracture with slight varus angulation. Electronically Signed   By: Margarette Canada M.D.   On: 08/12/2020 11:44  A/P: Daneil Dan is a very pleasant 87 year old man who unfortunately had a mechanical fall today and noted significant pain and loss in the ability to ambulate.  Patient was evaluated and transferred from the med center droppage because of a right intertrochanteric hip fracture.  Patient is hemodynamically stable with no signs or symptoms of a compartment syndrome.  I discussed her care with Dr. Wynelle Link my partner and he will plan on definitive fixation tomorrow.  Patient will remain n.p.o. after midnight tonight.

## 2020-08-12 NOTE — Assessment & Plan Note (Addendum)
-   s/p mechanical fall at home per story - xray shows "Intertrochanteric RIGHT femur fracture with slight varus angulation" - continue pain control  - asa 325 mg daily per ortho for DVT ppx starting 8/8 - PT starting 8/8

## 2020-08-12 NOTE — Anesthesia Preprocedure Evaluation (Addendum)
Anesthesia Evaluation  Patient identified by MRN, date of birth, ID band Patient awake    Reviewed: Allergy & Precautions, H&P , NPO status , Patient's Chart, lab work & pertinent test results  Airway Mallampati: II  TM Distance: >3 FB Neck ROM: Full    Dental no notable dental hx. (+) Dental Advisory Given   Pulmonary neg pulmonary ROS,    Pulmonary exam normal breath sounds clear to auscultation       Cardiovascular Exercise Tolerance: Good hypertension, Pt. on medications negative cardio ROS Normal cardiovascular exam Rhythm:Regular Rate:Normal     Neuro/Psych negative neurological ROS  negative psych ROS   GI/Hepatic negative GI ROS, Neg liver ROS,   Endo/Other  negative endocrine ROSHypothyroidism   Renal/GU negative Renal ROS  negative genitourinary   Musculoskeletal negative musculoskeletal ROS (+) Arthritis , Osteoarthritis,    Abdominal   Peds negative pediatric ROS (+)  Hematology negative hematology ROS (+)   Anesthesia Other Findings   Reproductive/Obstetrics negative OB ROS                            Anesthesia Physical Anesthesia Plan  ASA: 3 and emergent  Anesthesia Plan: General   Post-op Pain Management:    Induction: Intravenous and Cricoid pressure planned  PONV Risk Score and Plan: 3 and Ondansetron, Dexamethasone and Treatment may vary due to age or medical condition  Airway Management Planned: Oral ETT  Additional Equipment: None  Intra-op Plan:   Post-operative Plan: Extubation in OR  Informed Consent: I have reviewed the patients History and Physical, chart, labs and discussed the procedure including the risks, benefits and alternatives for the proposed anesthesia with the patient or authorized representative who has indicated his/her understanding and acceptance.       Plan Discussed with: Anesthesiologist and CRNA  Anesthesia Plan Comments:         Anesthesia Quick Evaluation

## 2020-08-12 NOTE — H&P (Signed)
History and Physical    Tanya [name]  [id number]  DOB: 1925-06-04  DOA: 08/12/2020  PCP: [name], MD Patient coming from: home  Chief Complaint: fall at home  HPI:  Tanya [name] is a 87 year old female with PMH TIA, vitamin B12 deficiency, mild cognitive impairment who presented to the ER after mechanical fall at home. She states that she was watching TV then upon standing up she saw a bug and lifted her right leg to step on it when she lost her footing and fell onto her right side.  She began experiencing significant right-sided hip pain after her fall. X-rays on work-up in the ER showed an intertrochanteric right femur fracture with slight varus angulation. She was transferred to [facility] for orthopedic surgery evaluation and probable surgical repair. Typically she is very mobile and ambulates well at home.  She can climb stairs with no difficulty nor any associated chest pain or shortness of breath.  No known cardiac conditions.  Had left shoulder surgery several years ago but no known issues with anesthesia at that time either.  I have personally briefly reviewed patient's old medical records in [facility] and discussed patient with the ER provider when appropriate/indicated.  Assessment/Plan: * Intertrochanteric fracture of right femur (HCC) - s/p mechanical fall at home per story - xray shows "Intertrochanteric RIGHT femur fracture with slight varus angulation" - continue pain control; change fent to morphine and follow up tolerance; tylenol as well for pain - NPO at [place]  - orthopedic surgery to see likely in am  - low risk from surgical standpoint; may need rehab post-op but otherwise low risk; METS >4 at baseline prior to fall  History of TIA (transient ischemic attack) - seen by Dr. Leonie Man on 02/28/20 - on aspirin 81 mg daily for secondary stroke prevention; on hold in anticipation of surgery  B12 deficiency - last B12 level 185 in 06/21/19 - on B12  supplement at home - check B12 level and will continue in hospital   HTN (hypertension) - continue amlodipine   Hypothyroidism - check TSH - continue home synthroid     Code Status:     Code Status: Full Code  DVT Prophylaxis:   SCDs Start: 08/12/20 1632   Anticipated disposition is to: likely rehab  History: Past Medical History:  Diagnosis Date   Hearing loss    Hypothyroidism    Neutropenia ([place])    Osteopenia    Primary osteoarthritis    Tremor     History reviewed. No pertinent surgical history.   reports that she has never smoked. She has never used smokeless tobacco. She reports current alcohol use of about 1.0 standard drink of alcohol per week. She reports previous drug use.  Allergies  Allergen Reactions   Codeine Other (See Comments)    Extreme headaches   Shellfish Allergy Hives   Shellfish-Derived Products Hives   Amoxicillin Itching and Rash    Family History  Problem Relation Age of Onset   Diabetes Sister    Home Medications: Prior to Admission medications   Medication Sig Start Date End Date Taking? Authorizing Provider  amLODipine (NORVASC) 2.5 MG tablet Take 2.5 mg by mouth every evening. 09/15/17  Yes [provider]  Apoaequorin (PREVAGEN) 10 MG CAPS Take 1 capsule by mouth every morning. Patient taking differently: Take 10 mg by mouth every morning. 06/21/19  Yes Garvin Fila, MD  ASPIRIN 81 PO Take 81 mg by mouth daily in the afternoon.  Yes [provider]  Cholecalciferol (VITAMIN D3) 50 MCG (2000 UT) TABS Take 2,000 Units by mouth daily.   Yes [provider]  levothyroxine (SYNTHROID) 112 MCG tablet Take 112 mcg by mouth daily before breakfast. 02/02/16  Yes [provider]  magnesium oxide (MAG-OX) 400 MG tablet Take 400 mg by mouth daily.   Yes [provider]  naproxen sodium (ALEVE) 220 MG tablet Take 220 mg by mouth 2 (two) times daily as needed (for pain).   Yes [provider]   vitamin B-12 (CYANOCOBALAMIN) 100 MCG tablet Take 100 mcg by mouth daily.   Yes [provider]    Review of Systems:  Pertinent items noted in HPI and remainder of comprehensive ROS otherwise negative.  Physical Exam: Vitals:   08/12/20 1400 08/12/20 1430 08/12/20 1500 08/12/20 1854  BP: (!) 130/106 (!) 157/75 (!) 157/77 137/74  Pulse: 99 (!) 105 84 96  Resp: '15 15 18 16  '$ Temp:   97.6 F (36.4 C) 98 F (36.7 C)  TempSrc:   Oral Oral  SpO2: 98% 96% 98% 99%  Weight:   62.5 kg   Height:   '5\' 2"'$  (1.575 m)    General appearance: alert, cooperative, and no distress Head: Normocephalic, without obvious abnormality, atraumatic Eyes:  EOMI Lungs: clear to auscultation bilaterally Heart: regular rate and rhythm and S1, S2 normal Abdomen: normal findings: bowel sounds normal and soft, non-tender Extremities:  no edema Skin: mobility and turgor normal Neurologic: RLE deferred due to fracture; no other focal deficits  Labs on Admission:  I have personally reviewed following labs and imaging studies Results for orders placed or performed during the hospital encounter of 08/12/20 (from the past 24 hour(s))  Basic metabolic panel     Status: Abnormal   Collection Time: 08/12/20 11:51 AM  Result Value Ref Range   Sodium 132 (L) 135 - 145 mmol/L   Potassium 3.9 3.5 - 5.1 mmol/L   Chloride 95 (L) 98 - 111 mmol/L   CO2 27 22 - 32 mmol/L   Glucose, Bld 101 (H) 70 - 99 mg/dL   BUN 11 8 - 23 mg/dL   Creatinine, Ser 0.65 0.44 - 1.00 mg/dL   Calcium 9.1 8.9 - 10.3 mg/dL   GFR, Estimated >60 >60 mL/min   Anion gap 10 5 - 15  CBC WITH DIFFERENTIAL     Status: None   Collection Time: 08/12/20 11:51 AM  Result Value Ref Range   WBC 4.9 4.0 - 10.5 K/uL   RBC 4.30 3.87 - 5.11 MIL/uL   Hemoglobin 12.1 12.0 - 15.0 g/dL   HCT 37.2 36.0 - 46.0 %   MCV 86.5 80.0 - 100.0 fL   MCH 28.1 26.0 - 34.0 pg   MCHC 32.5 30.0 - 36.0 g/dL   RDW 14.6 11.5 - 15.5 %   Platelets 356 150 - 400 K/uL    nRBC 0.0 0.0 - 0.2 %   Neutrophils Relative % 62 %   Neutro Abs 3.1 1.7 - 7.7 K/uL   Lymphocytes Relative 21 %   Lymphs Abs 1.0 0.7 - 4.0 K/uL   Monocytes Relative 14 %   Monocytes Absolute 0.7 0.1 - 1.0 K/uL   Eosinophils Relative 2 %   Eosinophils Absolute 0.1 0.0 - 0.5 K/uL   Basophils Relative 1 %   Basophils Absolute 0.1 0.0 - 0.1 K/uL   Immature Granulocytes 0 %   Abs Immature Granulocytes 0.00 0.00 - 0.07 K/uL  Protime-INR  Status: None   Collection Time: 08/12/20 11:51 AM  Result Value Ref Range   Prothrombin Time 13.4 11.4 - 15.2 seconds   INR 1.0 0.8 - 1.2  Resp Panel by RT-PCR (Flu A&B, Covid) Nasopharyngeal Swab     Status: None   Collection Time: 08/12/20 12:11 PM   Specimen: Nasopharyngeal Swab; Nasopharyngeal(NP) swabs in vial transport medium  Result Value Ref Range   SARS Coronavirus 2 by RT PCR NEGATIVE NEGATIVE   Influenza A by PCR NEGATIVE NEGATIVE   Influenza B by PCR NEGATIVE NEGATIVE     Radiological Exams on Admission: DG Hip Unilat  With Pelvis 2-3 Views Right  Result Date: 08/12/2020 CLINICAL DATA:  Acute RIGHT hip pain following fall. EXAM: DG HIP (WITH OR WITHOUT PELVIS) 2-3V RIGHT COMPARISON:  None. FINDINGS: An intertrochanteric fracture of the proximal RIGHT femur is identified with slight varus angulation. No dislocation noted. Degenerative changes in the hips and LOWER lumbar spine are present. IMPRESSION: Intertrochanteric RIGHT femur fracture with slight varus angulation. Electronically Signed   By: Margarette Canada M.D.   On: 08/12/2020 11:44   DG Hip Unilat  With Pelvis 2-3 Views Right  Final Result      Consults called:  Ortho surgery   EKG: Independently reviewed. NSR, Qtc 447   Dwyane Dee, MD [facility] 08/12/2020, 7:02 PM

## 2020-08-12 NOTE — Hospital Course (Addendum)
Ms. Minck is a 87 year old female with PMH TIA, vitamin B12 deficiency, mild cognitive impairment who presented to the ER after mechanical fall at home. She states that she was watching TV then upon standing up she saw a bug and lifted her right leg to step on it when she lost her footing and fell onto her right side.  She began experiencing significant right-sided hip pain after her fall. X-rays on work-up in the ER showed an intertrochanteric right femur fracture with slight varus angulation. She was transferred to [facility] for orthopedic surgery evaluation and probable surgical repair. Typically she is very mobile and ambulates well at home.  She can climb stairs with no difficulty nor any associated chest pain or shortness of breath.  No known cardiac conditions.  Had left shoulder surgery several years ago but no known issues with anesthesia at that time either. She underwent IM nail fixation on 08/13/20 with orthopedic surgery.   Patient had been recovering as expected until 08/15/2020 when she developed acute onset of mild right facial droop and dysarthria.  A code stroke was initiated and she was evaluated by tele neurology.

## 2020-08-12 NOTE — ED Notes (Signed)
Pt placed on 2L 02 due to sats decreasing to 89 after 25 mcg Fentanyl

## 2020-08-13 ENCOUNTER — Observation Stay (HOSPITAL_COMMUNITY): Payer: PPO | Admitting: Anesthesiology

## 2020-08-13 ENCOUNTER — Observation Stay (HOSPITAL_COMMUNITY): Payer: PPO

## 2020-08-13 ENCOUNTER — Encounter (HOSPITAL_COMMUNITY)
Admission: EM | Disposition: A | Discharge status: Skilled Nursing Facility | Payer: Self-pay | Source: Home / Self Care | Attending: Internal Medicine

## 2020-08-13 DIAGNOSIS — Z79899 Other long term (current) drug therapy: Secondary | ICD-10-CM | POA: Diagnosis not present

## 2020-08-13 DIAGNOSIS — J9601 Acute respiratory failure with hypoxia: Secondary | ICD-10-CM

## 2020-08-13 DIAGNOSIS — E039 Hypothyroidism, unspecified: Secondary | ICD-10-CM | POA: Diagnosis not present

## 2020-08-13 DIAGNOSIS — S72144A Nondisplaced intertrochanteric fracture of right femur, initial encounter for closed fracture: Secondary | ICD-10-CM | POA: Diagnosis not present

## 2020-08-13 DIAGNOSIS — M8580 Other specified disorders of bone density and structure, unspecified site: Secondary | ICD-10-CM | POA: Diagnosis present

## 2020-08-13 DIAGNOSIS — S72009A Fracture of unspecified part of neck of unspecified femur, initial encounter for closed fracture: Secondary | ICD-10-CM | POA: Diagnosis present

## 2020-08-13 DIAGNOSIS — Z880 Allergy status to penicillin: Secondary | ICD-10-CM | POA: Diagnosis not present

## 2020-08-13 DIAGNOSIS — R112 Nausea with vomiting, unspecified: Secondary | ICD-10-CM | POA: Diagnosis not present

## 2020-08-13 DIAGNOSIS — Z8673 Personal history of transient ischemic attack (TIA), and cerebral infarction without residual deficits: Secondary | ICD-10-CM | POA: Diagnosis not present

## 2020-08-13 DIAGNOSIS — M1991 Primary osteoarthritis, unspecified site: Secondary | ICD-10-CM | POA: Diagnosis present

## 2020-08-13 DIAGNOSIS — I6389 Other cerebral infarction: Secondary | ICD-10-CM | POA: Diagnosis not present

## 2020-08-13 DIAGNOSIS — I639 Cerebral infarction, unspecified: Secondary | ICD-10-CM | POA: Diagnosis not present

## 2020-08-13 DIAGNOSIS — Z20822 Contact with and (suspected) exposure to covid-19: Secondary | ICD-10-CM | POA: Diagnosis present

## 2020-08-13 DIAGNOSIS — Z7982 Long term (current) use of aspirin: Secondary | ICD-10-CM | POA: Diagnosis not present

## 2020-08-13 DIAGNOSIS — W010XXA Fall on same level from slipping, tripping and stumbling without subsequent striking against object, initial encounter: Secondary | ICD-10-CM | POA: Diagnosis present

## 2020-08-13 DIAGNOSIS — R471 Dysarthria and anarthria: Secondary | ICD-10-CM | POA: Diagnosis not present

## 2020-08-13 DIAGNOSIS — S72141A Displaced intertrochanteric fracture of right femur, initial encounter for closed fracture: Secondary | ICD-10-CM | POA: Diagnosis present

## 2020-08-13 DIAGNOSIS — E538 Deficiency of other specified B group vitamins: Secondary | ICD-10-CM | POA: Diagnosis present

## 2020-08-13 DIAGNOSIS — Z4889 Encounter for other specified surgical aftercare: Secondary | ICD-10-CM | POA: Diagnosis not present

## 2020-08-13 DIAGNOSIS — Y92019 Unspecified place in single-family (private) house as the place of occurrence of the external cause: Secondary | ICD-10-CM | POA: Diagnosis not present

## 2020-08-13 DIAGNOSIS — Z79890 Hormone replacement therapy (postmenopausal): Secondary | ICD-10-CM | POA: Diagnosis not present

## 2020-08-13 DIAGNOSIS — G3184 Mild cognitive impairment, so stated: Secondary | ICD-10-CM | POA: Diagnosis present

## 2020-08-13 DIAGNOSIS — I10 Essential (primary) hypertension: Secondary | ICD-10-CM | POA: Diagnosis present

## 2020-08-13 DIAGNOSIS — Z833 Family history of diabetes mellitus: Secondary | ICD-10-CM | POA: Diagnosis not present

## 2020-08-13 DIAGNOSIS — H9190 Unspecified hearing loss, unspecified ear: Secondary | ICD-10-CM | POA: Diagnosis present

## 2020-08-13 DIAGNOSIS — R29810 Facial weakness: Secondary | ICD-10-CM | POA: Diagnosis not present

## 2020-08-13 DIAGNOSIS — Z885 Allergy status to narcotic agent status: Secondary | ICD-10-CM | POA: Diagnosis not present

## 2020-08-13 DIAGNOSIS — K5900 Constipation, unspecified: Secondary | ICD-10-CM | POA: Diagnosis not present

## 2020-08-13 HISTORY — PX: INTRAMEDULLARY (IM) NAIL INTERTROCHANTERIC: SHX5875

## 2020-08-13 LAB — CBC WITH DIFFERENTIAL/PLATELET
Abs Immature Granulocytes: 0.02 10*3/uL (ref 0.00–0.07)
Basophils Absolute: 0 10*3/uL (ref 0.0–0.1)
Basophils Relative: 0 %
Eosinophils Absolute: 0 10*3/uL (ref 0.0–0.5)
Eosinophils Relative: 0 %
HCT: 35 % — ABNORMAL LOW (ref 36.0–46.0)
Hemoglobin: 11.5 g/dL — ABNORMAL LOW (ref 12.0–15.0)
Immature Granulocytes: 0 %
Lymphocytes Relative: 14 %
Lymphs Abs: 0.9 10*3/uL (ref 0.7–4.0)
MCH: 28 pg (ref 26.0–34.0)
MCHC: 32.9 g/dL (ref 30.0–36.0)
MCV: 85.4 fL (ref 80.0–100.0)
Monocytes Absolute: 1 10*3/uL (ref 0.1–1.0)
Monocytes Relative: 16 %
Neutro Abs: 4.2 10*3/uL (ref 1.7–7.7)
Neutrophils Relative %: 70 %
Platelets: 310 10*3/uL (ref 150–400)
RBC: 4.1 MIL/uL (ref 3.87–5.11)
RDW: 14.3 % (ref 11.5–15.5)
WBC: 6.1 10*3/uL (ref 4.0–10.5)
nRBC: 0 % (ref 0.0–0.2)

## 2020-08-13 LAB — BASIC METABOLIC PANEL
Anion gap: 8 (ref 5–15)
BUN: 14 mg/dL (ref 8–23)
CO2: 25 mmol/L (ref 22–32)
Calcium: 8.8 mg/dL — ABNORMAL LOW (ref 8.9–10.3)
Chloride: 95 mmol/L — ABNORMAL LOW (ref 98–111)
Creatinine, Ser: 0.57 mg/dL (ref 0.44–1.00)
GFR, Estimated: >60 mL/min (ref >60)
Glucose, Bld: 128 mg/dL — ABNORMAL HIGH (ref 70–99)
Potassium: 3.8 mmol/L (ref 3.5–5.1)
Sodium: 128 mmol/L — ABNORMAL LOW (ref 135–145)

## 2020-08-13 LAB — T4, FREE: Free T4: 0.6 ng/dL — ABNORMAL LOW (ref 0.61–1.12)

## 2020-08-13 LAB — MAGNESIUM: Magnesium: 2 mg/dL (ref 1.7–2.4)

## 2020-08-13 LAB — VITAMIN B12: Vitamin B-12: 1201 pg/mL — ABNORMAL HIGH (ref 180–914)

## 2020-08-13 LAB — TSH: TSH: 24.653 u[IU]/mL — ABNORMAL HIGH (ref 0.350–4.500)

## 2020-08-13 SURGERY — FIXATION, FRACTURE, INTERTROCHANTERIC, WITH INTRAMEDULLARY ROD
Anesthesia: General | Site: Hip | Laterality: Right

## 2020-08-13 MED ORDER — PHENYLEPHRINE 40 MCG/ML (10ML) SYRINGE FOR IV PUSH (FOR BLOOD PRESSURE SUPPORT)
PREFILLED_SYRINGE | INTRAVENOUS | Status: DC | PRN
  Administered 2020-08-13: 120 ug via INTRAVENOUS
  Administered 2020-08-13 (×2): 80 ug via INTRAVENOUS
  Administered 2020-08-13: 120 ug via INTRAVENOUS
  Administered 2020-08-13: 80 ug via INTRAVENOUS
  Administered 2020-08-13: 120 ug via INTRAVENOUS
  Administered 2020-08-13: 80 ug via INTRAVENOUS

## 2020-08-13 MED ORDER — FENTANYL CITRATE (PF) 100 MCG/2ML IJ SOLN
INTRAMUSCULAR | Status: AC
  Filled 2020-08-13: qty 2

## 2020-08-13 MED ORDER — MORPHINE SULFATE (PF) 4 MG/ML IV SOLN: 1.0000 mg | INTRAVENOUS | Status: DC | PRN

## 2020-08-13 MED ORDER — MENTHOL 3 MG MT LOZG: 1.0000 | LOZENGE | OROMUCOSAL | Status: DC | PRN

## 2020-08-13 MED ORDER — OXYCODONE HCL 5 MG PO TABS: 5.0000 mg | ORAL_TABLET | Freq: Once | ORAL | Status: DC | PRN

## 2020-08-13 MED ORDER — CEFAZOLIN SODIUM-DEXTROSE 2-4 GM/100ML-% IV SOLN
INTRAVENOUS | Status: AC
  Filled 2020-08-13: qty 100

## 2020-08-13 MED ORDER — ONDANSETRON HCL 4 MG/2ML IJ SOLN
4.0000 mg | Freq: Once | INTRAMUSCULAR | Status: AC
  Administered 2020-08-13: 4 mg via INTRAVENOUS

## 2020-08-13 MED ORDER — ASPIRIN EC 325 MG PO TBEC
325.0000 mg | DELAYED_RELEASE_TABLET | Freq: Every day | ORAL | Status: DC
  Administered 2020-08-14 – 2020-08-18 (×5): 325 mg via ORAL
  Filled 2020-08-13 (×5): qty 1

## 2020-08-13 MED ORDER — CEFAZOLIN SODIUM-DEXTROSE 2-4 GM/100ML-% IV SOLN
2.0000 g | Freq: Four times a day (QID) | INTRAVENOUS | Status: AC
  Administered 2020-08-13 (×2): 2 g via INTRAVENOUS
  Filled 2020-08-13 (×2): qty 100

## 2020-08-13 MED ORDER — EPHEDRINE 5 MG/ML INJ
INTRAVENOUS | Status: AC
  Filled 2020-08-13: qty 5

## 2020-08-13 MED ORDER — ONDANSETRON HCL 4 MG/2ML IJ SOLN: 4.0000 mg | Freq: Once | INTRAMUSCULAR | Status: DC | PRN

## 2020-08-13 MED ORDER — ACETAMINOPHEN 160 MG/5ML PO SOLN: 325.0000 mg | ORAL | Status: DC | PRN

## 2020-08-13 MED ORDER — ONDANSETRON HCL 4 MG/2ML IJ SOLN
INTRAMUSCULAR | Status: DC | PRN
  Administered 2020-08-13: 4 mg via INTRAVENOUS

## 2020-08-13 MED ORDER — PHENOL 1.4 % MT LIQD: 1.0000 | OROMUCOSAL | Status: DC | PRN

## 2020-08-13 MED ORDER — SODIUM CHLORIDE 0.9 % IV SOLN: INTRAVENOUS | Status: DC

## 2020-08-13 MED ORDER — DEXAMETHASONE SODIUM PHOSPHATE 10 MG/ML IJ SOLN
INTRAMUSCULAR | Status: AC
  Filled 2020-08-13: qty 1

## 2020-08-13 MED ORDER — LACTATED RINGERS IV SOLN: INTRAVENOUS | Status: DC | PRN

## 2020-08-13 MED ORDER — STERILE WATER FOR IRRIGATION IR SOLN
Status: DC | PRN
  Administered 2020-08-13: 1000 mL

## 2020-08-13 MED ORDER — METOCLOPRAMIDE HCL 5 MG/ML IJ SOLN
5.0000 mg | Freq: Three times a day (TID) | INTRAMUSCULAR | Status: DC | PRN
  Administered 2020-08-17: 10 mg via INTRAVENOUS
  Filled 2020-08-13: qty 2

## 2020-08-13 MED ORDER — METHOCARBAMOL 500 MG PO TABS: 500.0000 mg | ORAL_TABLET | Freq: Four times a day (QID) | ORAL | Status: DC | PRN

## 2020-08-13 MED ORDER — ONDANSETRON HCL 4 MG/2ML IJ SOLN
INTRAMUSCULAR | Status: AC
  Filled 2020-08-13: qty 2

## 2020-08-13 MED ORDER — ROCURONIUM BROMIDE 10 MG/ML (PF) SYRINGE
PREFILLED_SYRINGE | INTRAVENOUS | Status: DC | PRN
  Administered 2020-08-13: 50 mg via INTRAVENOUS

## 2020-08-13 MED ORDER — LIDOCAINE 2% (20 MG/ML) 5 ML SYRINGE
INTRAMUSCULAR | Status: DC | PRN
  Administered 2020-08-13: 100 mg via INTRAVENOUS

## 2020-08-13 MED ORDER — HYDROCODONE-ACETAMINOPHEN 5-325 MG PO TABS
1.0000 | ORAL_TABLET | ORAL | Status: DC | PRN
  Administered 2020-08-14 – 2020-08-15 (×5): 1 via ORAL
  Filled 2020-08-13 (×5): qty 1

## 2020-08-13 MED ORDER — FENTANYL CITRATE (PF) 100 MCG/2ML IJ SOLN
25.0000 ug | INTRAMUSCULAR | Status: DC | PRN
  Administered 2020-08-13 (×2): 50 ug via INTRAVENOUS

## 2020-08-13 MED ORDER — DOCUSATE SODIUM 100 MG PO CAPS
100.0000 mg | ORAL_CAPSULE | Freq: Two times a day (BID) | ORAL | Status: DC
  Administered 2020-08-13 – 2020-08-18 (×10): 100 mg via ORAL
  Filled 2020-08-13 (×11): qty 1

## 2020-08-13 MED ORDER — SUGAMMADEX SODIUM 200 MG/2ML IV SOLN
INTRAVENOUS | Status: DC | PRN
  Administered 2020-08-13: 200 mg via INTRAVENOUS

## 2020-08-13 MED ORDER — DEXAMETHASONE SODIUM PHOSPHATE 10 MG/ML IJ SOLN
INTRAMUSCULAR | Status: DC | PRN
  Administered 2020-08-13: 10 mg via INTRAVENOUS

## 2020-08-13 MED ORDER — MEPERIDINE HCL 50 MG/ML IJ SOLN: 6.2500 mg | INTRAMUSCULAR | Status: DC | PRN

## 2020-08-13 MED ORDER — CEFAZOLIN SODIUM-DEXTROSE 2-3 GM-%(50ML) IV SOLR
INTRAVENOUS | Status: DC | PRN
  Administered 2020-08-13: 2 g via INTRAVENOUS

## 2020-08-13 MED ORDER — LIDOCAINE 2% (20 MG/ML) 5 ML SYRINGE
INTRAMUSCULAR | Status: AC
  Filled 2020-08-13: qty 5

## 2020-08-13 MED ORDER — METHOCARBAMOL 500 MG IVPB - SIMPLE MED
500.0000 mg | Freq: Four times a day (QID) | INTRAVENOUS | Status: DC | PRN
  Administered 2020-08-13: 500 mg via INTRAVENOUS
  Filled 2020-08-13: qty 500

## 2020-08-13 MED ORDER — ALBUMIN HUMAN 5 % IV SOLN: INTRAVENOUS | Status: DC | PRN

## 2020-08-13 MED ORDER — PROPOFOL 10 MG/ML IV BOLUS
INTRAVENOUS | Status: AC
  Filled 2020-08-13: qty 20

## 2020-08-13 MED ORDER — PROPOFOL 10 MG/ML IV BOLUS
INTRAVENOUS | Status: DC | PRN
  Administered 2020-08-13: 100 mg via INTRAVENOUS

## 2020-08-13 MED ORDER — CHLORHEXIDINE GLUCONATE CLOTH 2 % EX PADS
6.0000 | MEDICATED_PAD | Freq: Once | CUTANEOUS | Status: AC
  Administered 2020-08-13: 6 via TOPICAL

## 2020-08-13 MED ORDER — METOCLOPRAMIDE HCL 5 MG PO TABS
5.0000 mg | ORAL_TABLET | Freq: Three times a day (TID) | ORAL | Status: DC | PRN
  Filled 2020-08-13 (×2): qty 2

## 2020-08-13 MED ORDER — FENTANYL CITRATE (PF) 100 MCG/2ML IJ SOLN
INTRAMUSCULAR | Status: DC | PRN
  Administered 2020-08-13 (×2): 50 ug via INTRAVENOUS

## 2020-08-13 MED ORDER — 0.9 % SODIUM CHLORIDE (POUR BTL) OPTIME
TOPICAL | Status: DC | PRN
  Administered 2020-08-13: 1000 mL

## 2020-08-13 MED ORDER — OXYCODONE HCL 5 MG/5ML PO SOLN: 5.0000 mg | Freq: Once | ORAL | Status: DC | PRN

## 2020-08-13 MED ORDER — PHENYLEPHRINE 40 MCG/ML (10ML) SYRINGE FOR IV PUSH (FOR BLOOD PRESSURE SUPPORT)
PREFILLED_SYRINGE | INTRAVENOUS | Status: AC
  Filled 2020-08-13: qty 10

## 2020-08-13 MED ORDER — ACETAMINOPHEN 325 MG PO TABS: 325.0000 mg | ORAL_TABLET | ORAL | Status: DC | PRN

## 2020-08-13 SURGICAL SUPPLY — 42 items
BAG COUNTER SPONGE SURGICOUNT (BAG) IMPLANT
BAG SPEC THK2 15X12 ZIP CLS (MISCELLANEOUS) ×1
BAG SPNG CNTER NS LX DISP (BAG)
BAG ZIPLOCK 12X15 (MISCELLANEOUS) ×2 IMPLANT
BIT DRILL CANN LG 4.3MM (BIT) IMPLANT
BNDG GAUZE ELAST 4 BULKY (GAUZE/BANDAGES/DRESSINGS) ×2 IMPLANT
COVER PERINEAL POST (MISCELLANEOUS) ×2 IMPLANT
COVER SURGICAL LIGHT HANDLE (MISCELLANEOUS) ×2 IMPLANT
DRAPE INCISE IOBAN 66X45 STRL (DRAPES) ×2 IMPLANT
DRESSING MEPILEX FLEX 4X4 (GAUZE/BANDAGES/DRESSINGS) ×2 IMPLANT
DRILL BIT CANN LG 4.3MM (BIT) ×2
DRSG MEPILEX BORDER 4X8 (GAUZE/BANDAGES/DRESSINGS) ×2 IMPLANT
DRSG MEPILEX FLEX 4X4 (GAUZE/BANDAGES/DRESSINGS) ×4
DURAPREP 26ML APPLICATOR (WOUND CARE) ×2 IMPLANT
ELECT REM PT RETURN 15FT ADLT (MISCELLANEOUS) ×2 IMPLANT
FACESHIELD WRAPAROUND (MASK) ×6 IMPLANT
FACESHIELD WRAPAROUND OR TEAM (MASK) ×3 IMPLANT
GLOVE SRG 8 PF TXTR STRL LF DI (GLOVE) ×2 IMPLANT
GLOVE SURG ENC MOIS LTX SZ6.5 (GLOVE) ×4 IMPLANT
GLOVE SURG ENC MOIS LTX SZ8 (GLOVE) ×6 IMPLANT
GLOVE SURG UNDER POLY LF SZ7 (GLOVE) ×4 IMPLANT
GLOVE SURG UNDER POLY LF SZ8 (GLOVE) ×4
GOWN STRL REUS W/TWL LRG LVL3 (GOWN DISPOSABLE) ×6 IMPLANT
GUIDEPIN VERSANAIL DSP 3.2X444 (ORTHOPEDIC DISPOSABLE SUPPLIES) ×1 IMPLANT
HFN 125 DEG 11MM X 180MM (Orthopedic Implant) ×1 IMPLANT
HIP FRAC NAIL LAG SCR 10.5X100 (Orthopedic Implant) ×2 IMPLANT
KIT BASIN OR (CUSTOM PROCEDURE TRAY) ×2 IMPLANT
KIT TURNOVER KIT A (KITS) ×2 IMPLANT
MANIFOLD NEPTUNE II (INSTRUMENTS) ×2 IMPLANT
NS IRRIG 1000ML POUR BTL (IV SOLUTION) ×2 IMPLANT
PACK GENERAL/GYN (CUSTOM PROCEDURE TRAY) ×2 IMPLANT
PENCIL SMOKE EVACUATOR (MISCELLANEOUS) IMPLANT
PROTECTOR NERVE ULNAR (MISCELLANEOUS) ×2 IMPLANT
SCREW BONE CORTICAL 5.0X36 (Screw) ×1 IMPLANT
SCREW CANN THRD AFF 10.5X100 (Orthopedic Implant) IMPLANT
STAPLER VISISTAT 35W (STAPLE) ×2 IMPLANT
SUT VIC AB 1 CT1 27 (SUTURE) ×2
SUT VIC AB 1 CT1 27XBRD ANTBC (SUTURE) ×1 IMPLANT
SUT VIC AB 2-0 CT1 27 (SUTURE) ×2
SUT VIC AB 2-0 CT1 TAPERPNT 27 (SUTURE) ×1 IMPLANT
TOWEL OR 17X26 10 PK STRL BLUE (TOWEL DISPOSABLE) ×2 IMPLANT
TRAY FOLEY MTR SLVR 16FR STAT (SET/KITS/TRAYS/PACK) IMPLANT

## 2020-08-13 NOTE — Anesthesia Procedure Notes (Signed)
Procedure Name: Intubation Date/Time: 08/13/2020 7:42 AM Performed by: Gerald Leitz, CRNA Pre-anesthesia Checklist: Patient identified, Patient being monitored, Timeout performed, Emergency Drugs available and Suction available Patient Re-evaluated:Patient Re-evaluated prior to induction Oxygen Delivery Method: Circle system utilized Preoxygenation: Pre-oxygenation with 100% oxygen Induction Type: IV induction Ventilation: Mask ventilation without difficulty Laryngoscope Size: Mac and 3 Grade View: Grade I Tube type: Oral Tube size: 7.0 mm Number of attempts: 1 Placement Confirmation: ETT inserted through vocal cords under direct vision, positive ETCO2 and breath sounds checked- equal and bilateral Secured at: 21 cm Tube secured with: Tape Dental Injury: Teeth and Oropharynx as per pre-operative assessment

## 2020-08-13 NOTE — Transfer of Care (Signed)
Immediate Anesthesia Transfer of Care Note  Patient: Tanya [name]  Procedure(s) Performed: Procedure(s): INTRAMEDULLARY (IM) NAIL INTERTROCHANTRIC (Right)  Patient Location: PACU  Anesthesia Type:General  Level of Consciousness: Alert, Awake, Oriented  Airway & Oxygen Therapy: Patient Spontanous Breathing  Post-op Assessment: Report given to RN  Post vital signs: Reviewed and stable  Last Vitals:  Vitals:   08/13/20 0700 08/13/20 0701  BP: 140/71   Pulse: 89   Resp: 15   Temp:    SpO2: 0000000 (!) [id number]    Complications: No apparent anesthesia complications

## 2020-08-13 NOTE — Progress Notes (Signed)
Pt is tx from floor. O2 sat dropped to 84% with room air, applied O2 2L/min via [place], up to 88%, increased O2 to 3L/min.  O2 sat 95%. Pt is alert oriented X4

## 2020-08-13 NOTE — Anesthesia Postprocedure Evaluation (Signed)
Anesthesia Post Note  Patient: KYRSTEN GONGWER  Procedure(s) Performed: INTRAMEDULLARY (IM) NAIL INTERTROCHANTRIC (Right: Hip)     Patient location during evaluation: PACU Anesthesia Type: General Level of consciousness: awake and alert Pain management: pain level controlled Vital Signs Assessment: post-procedure vital signs reviewed and stable Respiratory status: spontaneous breathing, nonlabored ventilation, respiratory function stable and patient connected to nasal cannula oxygen Cardiovascular status: blood pressure returned to baseline and stable Postop Assessment: no apparent nausea or vomiting Anesthetic complications: no   No notable events documented.  Last Vitals:  Vitals:   08/13/20 1015 08/13/20 1030  BP: (!) 107/49 (!) 103/59  Pulse: 78 77  Resp: 13 14  Temp:  36.7 C  SpO2: 94% 95%    Last Pain:  Vitals:   08/13/20 1030  TempSrc:   PainSc: 0-No pain                 Giavonni Cizek

## 2020-08-13 NOTE — Plan of Care (Signed)
  Problem: Education: Goal: Knowledge of General Education information will improve Description: Including pain rating scale, medication(s)/side effects and non-pharmacologic comfort measures Outcome: Progressing   Problem: Activity: Goal: Risk for activity intolerance will decrease Outcome: Progressing   Problem: Pain Managment: Goal: General experience of comfort will improve Outcome: Progressing   

## 2020-08-13 NOTE — Op Note (Signed)
  OPERATIVE REPORT   PREOPERATIVE DIAGNOSIS: Right intertrochanteric femur fracture.   POSTOP DIAGNOSIS: Right intertrochanteric femur fracture.   PROCEDURE: Intramedullary nailing, Right intertrochanteric femur  fracture.   SURGEON: Gaynelle Arabian, M.D.   ASSISTANT: Fenton Foy, PA-C  ANESTHESIA:General  Estimated BLOOD LOSS: 200 ml  DRAINS: None.   COMPLICATIONS:   None  CONDITION: -PACU - hemodynamically stable.    CLINICAL NOTE: Tanya [name] is an 87 y.o. female, who had a fall yesterday sustaining a displaced  Right intertrochanteric femur fracture. They have been cleared medically and present for operative fixation   PROCEDURE IN DETAIL: After successful administration of  General,  the patient was placed on the fracture table with Right lower extremity in a well-padded traction boot,  Left lower extremity in a well-padded leg holder. Under fluoroscopic guidance, the fracture wasreduced. The traction was locked in this position. Thigh was prepped  and draped in the usual sterile fashion. The guide pin for the [employer]  Affixus was then passed percutaneously to the tip of the greater  trochanter, and then entered into the femoral canal. It was passed into the  canal. The small incision was made and the starter reamer passed over  the guide pin. This was then removed. The nail which was an 11  mm  diameter short trochanteric nail with 125 degrees angle was attached to  the external guide and then passed into the femoral canal, impacted to  the appropriate depth in the canal, then we used the external guide to  place the lag screw. Through the external guide, a guide pin was  passed. Small incision made, and the guide pin was in the center of the  femoral head on the AP and slightly center to posterior on the lateral.  Length was 100 mm. Triple reamer was passed over the guide pin. 100 mm  lag screw was placed. It was then locked down with a locking screw.  Through the  external guide, the distal interlock was placed through the  static hole and this was 36 mm in length with excellent bicortical  purchase. The external guide was then removed. Hardware was in good  position and fracture was well reduced. Wound was copiously irrigated with saline  solution, and  closed deep with interrupted 1 Vicryl, subcu  interrupted 2-0 Vicryl, subcuticular running 4-0 Monocryl. Incision was  cleaned and dried and sterile dressings applied. The patient was awakened and  transported to recovery in stable condition.   Tanya Plover Brianni Manthe, MD    08/13/2020, 8:42 AM

## 2020-08-13 NOTE — Assessment & Plan Note (Addendum)
-   likely from pain medication/over sedation; will be cautious as able treating pain - wean O2 as able - continue aggressive incentive spirometry use; likely some atelectasis from pain meds and surgery; not on home O2 at baseline

## 2020-08-13 NOTE — Interval H&P Note (Signed)
History and Physical Interval Note:  08/13/2020 7:17 AM  Tanya [name]  has presented today for surgery, with the diagnosis of RIGHT INTERTROCHANTERIC HIP FRACTURE.  The various methods of treatment have been discussed with the patient and family. After consideration of risks, benefits and other options for treatment, the patient has consented to  Procedure(s): INTRAMEDULLARY (IM) NAIL INTERTROCHANTRIC (Right) as a surgical intervention.  The patient's history has been reviewed, patient examined, no change in status, stable for surgery.  I have reviewed the patient's chart and labs.  Questions were answered to the patient's satisfaction.     Pilar Plate [name]

## 2020-08-13 NOTE — Progress Notes (Signed)
Progress Note    Tanya [name]   [id number]  DOB: 10-Sep-1925  DOA: 08/12/2020     0  PCP: [name], MD  Initial CC: right hip pain; fall at home  Hospital Course: Tanya [name] is a 87 year old female with PMH TIA, vitamin B12 deficiency, mild cognitive impairment who presented to [facility] ER after mechanical fall at home. She states that she was watching TV then upon standing up she saw a bug and lifted her right leg to step on it when she lost her footing and fell onto her right side.  She began experiencing significant right-sided hip pain after her fall. X-rays on work-up in [facility] ER showed an intertrochanteric right femur fracture with slight varus angulation. She was transferred to [facility] for orthopedic surgery evaluation and probable surgical repair. Typically she is very mobile and ambulates well at home.  She can climb stairs with no difficulty nor any associated chest pain or shortness of breath.  No known cardiac conditions.  Had left shoulder surgery several years ago but no known issues with anesthesia at that time either. She underwent IM nail fixation on 08/13/20 with orthopedic surgery.   Interval History:  Seen in her room this morning after surgery.  Family bedside as well.  Tolerated surgery well and still remains on oxygen. Reviewed home administration of her synthroid; she is taking it properly.  ROS: Constitutional: negative for chills and fevers, Respiratory: negative for cough and sputum, Cardiovascular: negative for chest pain, and Gastrointestinal: negative for abdominal pain  Assessment & Plan: * Intertrochanteric fracture of right femur (HCC) - s/p mechanical fall at home per story - xray shows "Intertrochanteric RIGHT femur fracture with slight varus angulation" - continue pain control; change fent to morphine and follow up tolerance; tylenol as well for pain - I will go ahead and resume regular diet - continue pain control  - asa 325 mg daily per  ortho for DVT ppx starting 8/8 - PT starting 8/8  Acute respiratory failure with hypoxia (HCC) - likely from pain medication/over sedation; will be cautious as able treating pain - wean O2 as able - please give [name] an incentive spirometer to start using as well   Hypothyroidism - check TSH (24.653); confirmed with [name] she is taking properly and is compliant - check FT4 and TT3 - continue home synthroid at current dose for now  History of TIA (transient ischemic attack) - seen by Dr. Leonie Man on 02/28/20 - on aspirin 81 mg daily for secondary stroke prevention; on hold in anticipation of surgery; see fracture   B12 deficiency - last B12 level 185 in 06/21/19 - on B12 supplement at home - check B12 level (1201) and will continue in hospital   HTN (hypertension) - continue amlodipine    Old records reviewed in assessment of this [name]  Antimicrobials:   DVT prophylaxis: SCDs Start: 08/13/20 0950 SCDs Start: 08/12/20 1632   Code Status:   Code Status: Full Code Family Communication:   Disposition Plan: Status is: Observation  Tanya [name] will require care spanning > 2 midnights and should be moved to inpatient because: Ongoing active pain requiring inpatient pain management and Inpatient level of care appropriate due to severity of illness  Dispo: Tanya [name] is from: Home              Anticipated d/c is to:  pending PT eval, possibly rehab              [name] currently is  not medically stable to d/c.   Difficult to place [name] No  Risk of unplanned readmission score:     Objective: Blood pressure (!) 103/59, pulse 77, temperature 98 F (36.7 C), resp. rate 14, height '5\' 2"'$  (1.575 m), weight 62.5 kg, SpO2 95 %.  Examination: General appearance: alert, cooperative, and no distress; oxygen in place Head: Normocephalic, without obvious abnormality, atraumatic Eyes:  EOMI Lungs: clear to auscultation bilaterally Heart: regular rate and rhythm and S1, S2  normal Abdomen: normal findings: bowel sounds normal and soft, non-tender Extremities: Right lower extremity surgical dressings in place and no surrounding edema.  Compartments soft Skin: mobility and turgor normal Neurologic: RLE deferred due to fracture; no other focal deficits  Consultants:  Orthopedic surgery  Procedures:  IM nail fixation, 08/13/2020  Data Reviewed: I have personally reviewed following labs and imaging studies Results for orders placed or performed during Tanya hospital encounter of 08/12/20 (from Tanya past 24 hour(s))  Surgical pcr screen     Status: None   Collection Time: 08/12/20  5:32 PM   Specimen: Nasal Mucosa; Nasal Swab  Result Value Ref Range   MRSA, PCR NEGATIVE NEGATIVE   Staphylococcus aureus NEGATIVE NEGATIVE  Basic metabolic panel     Status: Abnormal   Collection Time: 08/13/20  2:30 AM  Result Value Ref Range   Sodium 128 (L) 135 - 145 mmol/L   Potassium 3.8 3.5 - 5.1 mmol/L   Chloride 95 (L) 98 - 111 mmol/L   CO2 25 22 - 32 mmol/L   Glucose, Bld 128 (H) 70 - 99 mg/dL   BUN 14 8 - 23 mg/dL   Creatinine, Ser 0.57 0.44 - 1.00 mg/dL   Calcium 8.8 (L) 8.9 - 10.3 mg/dL   GFR, Estimated >60 >60 mL/min   Anion gap 8 5 - 15  CBC with Differential/Platelet     Status: Abnormal   Collection Time: 08/13/20  2:30 AM  Result Value Ref Range   WBC 6.1 4.0 - 10.5 K/uL   RBC 4.10 3.87 - 5.11 MIL/uL   Hemoglobin 11.5 (L) 12.0 - 15.0 g/dL   HCT 35.0 (L) 36.0 - 46.0 %   MCV 85.4 80.0 - 100.0 fL   MCH 28.0 26.0 - 34.0 pg   MCHC 32.9 30.0 - 36.0 g/dL   RDW 14.3 11.5 - 15.5 %   Platelets 310 150 - 400 K/uL   nRBC 0.0 0.0 - 0.2 %   Neutrophils Relative % 70 %   Neutro Abs 4.2 1.7 - 7.7 K/uL   Lymphocytes Relative 14 %   Lymphs Abs 0.9 0.7 - 4.0 K/uL   Monocytes Relative 16 %   Monocytes Absolute 1.0 0.1 - 1.0 K/uL   Eosinophils Relative 0 %   Eosinophils Absolute 0.0 0.0 - 0.5 K/uL   Basophils Relative 0 %   Basophils Absolute 0.0 0.0 - 0.1 K/uL    Immature Granulocytes 0 %   Abs Immature Granulocytes 0.02 0.00 - 0.07 K/uL  Magnesium     Status: None   Collection Time: 08/13/20  2:30 AM  Result Value Ref Range   Magnesium 2.0 1.7 - 2.4 mg/dL  TSH     Status: Abnormal   Collection Time: 08/13/20  2:30 AM  Result Value Ref Range   TSH 24.653 (H) 0.350 - 4.500 uIU/mL  Vitamin B12     Status: Abnormal   Collection Time: 08/13/20  2:30 AM  Result Value Ref Range   Vitamin B-12 1,201 (H) 180 -  914 pg/mL    Recent Results (from Tanya past 240 hour(s))  Resp Panel by RT-PCR (Flu A&B, Covid) Nasopharyngeal Swab     Status: None   Collection Time: 08/12/20 12:11 PM   Specimen: Nasopharyngeal Swab; Nasopharyngeal(NP) swabs in vial transport medium  Result Value Ref Range Status   SARS Coronavirus 2 by RT PCR NEGATIVE NEGATIVE Final    Comment: (NOTE) SARS-CoV-2 target nucleic acids are NOT DETECTED.  Tanya SARS-CoV-2 RNA is generally detectable in upper respiratory specimens during Tanya acute phase of infection. Tanya lowest concentration of SARS-CoV-2 viral copies this assay can detect is 138 copies/mL. A negative result does not preclude SARS-Cov-2 infection and should not be used as Tanya sole basis for treatment or other [name] management decisions. A negative result may occur with  improper specimen collection/handling, submission of specimen other than nasopharyngeal swab, presence of viral mutation(s) within Tanya areas targeted by this assay, and inadequate number of viral copies(<138 copies/mL). A negative result must be combined with clinical observations, [name] history, and epidemiological information. Tanya expected result is Negative.  Fact Sheet for Patients:  EntrepreneurPulse.com.au  Fact Sheet for Healthcare Providers:  IncredibleEmployment.be  This test is no t yet approved or cleared by Tanya [place] FDA and  has been authorized for detection and/or diagnosis of SARS-CoV-2 by FDA  under an Emergency Use Authorization (EUA). This EUA will remain  in effect (meaning this test can be used) for Tanya duration of Tanya COVID-19 declaration under Section 564(b)(1) of Tanya Act, 21 U.S.C.section 360bbb-3(b)(1), unless Tanya authorization is terminated  or revoked sooner.       Influenza A by PCR NEGATIVE NEGATIVE Final   Influenza B by PCR NEGATIVE NEGATIVE Final    Comment: (NOTE) Tanya Xpert Xpress SARS-CoV-2/FLU/RSV plus assay is intended as an aid in Tanya diagnosis of influenza from Nasopharyngeal swab specimens and should not be used as a sole basis for treatment. Nasal washings and aspirates are unacceptable for Xpert Xpress SARS-CoV-2/FLU/RSV testing.  Fact Sheet for Patients: EntrepreneurPulse.com.au  Fact Sheet for Healthcare Providers: IncredibleEmployment.be  This test is not yet approved or cleared by [place]anya [place] FDA and has been authorized for detection and/or diagnosis of SARS-CoV-2 by FDA under an Emergency Use Authorization (EUA). This EUA will remain in effect (meaning this test can be used) for Tanya duration of Tanya COVID-19 declaration under Section 564(b)(1) of Tanya Act, 21 U.S.C. section 360bbb-3(b)(1), unless Tanya authorization is terminated or revoked.  Performed at [facility], [address], [place], [place] [address]   Surgical pcr screen     Status: None   Collection Time: 08/12/20  5:32 PM   Specimen: Nasal Mucosa; Nasal Swab  Result Value Ref Range Status   MRSA, PCR NEGATIVE NEGATIVE Final   Staphylococcus aureus NEGATIVE NEGATIVE Final    Comment: (NOTE) Tanya Xpert SA Assay (FDA approved for NASAL specimens in patients 24 years of age and older), is one component of a comprehensive surveillance program. It is not intended to diagnose infection nor to guide or monitor treatment. Performed at [facility], [place] [address] [place], [place] [address]       Radiology Studies: DG C-Arm 1-60 Min-No Report  Result Date: 08/13/2020 Fluoroscopy was utilized by Tanya requesting physician.  No radiographic interpretation.   DG HIP OPERATIVE UNILAT W OR W/O PELVIS RIGHT  Result Date: 08/13/2020 CLINICAL DATA:  Surgery, elective Z41.9 (ICD-10-CM) EXAM: OPERATIVE RIGHT HIP (WITH PELVIS IF PERFORMED) 3 VIEWS TECHNIQUE: Fluoroscopic spot  image(s) were submitted for interpretation post-operatively. COMPARISON:  08/12/2020. FINDINGS: Fluoro time: 26 seconds Reported radiation dose: 4.9 mGy Three C-arm fluoroscopic images were obtained intraoperatively and submitted for post operative interpretation. These images demonstrate intramedullary nail and screw fixation of an intertrochanteric right femur fracture with near anatomic alignment. Please see Tanya performing provider's procedural report for further detail. IMPRESSION: Intraoperative fluoroscopy, as detailed above. Electronically Signed   By: Margaretha Sheffield MD   On: 08/13/2020 09:08   DG Hip Unilat  With Pelvis 2-3 Views Right  Result Date: 08/12/2020 CLINICAL DATA:  Acute RIGHT hip pain following fall. EXAM: DG HIP (WITH OR WITHOUT PELVIS) 2-3V RIGHT COMPARISON:  None. FINDINGS: An intertrochanteric fracture of Tanya proximal RIGHT femur is identified with slight varus angulation. No dislocation noted. Degenerative changes in Tanya hips and LOWER lumbar spine are present. IMPRESSION: Intertrochanteric RIGHT femur fracture with slight varus angulation. Electronically Signed   By: Margarette Canada M.D.   On: 08/12/2020 11:44   DG C-Arm 1-60 Min-No Report  Final Result    DG HIP OPERATIVE UNILAT W OR W/O PELVIS RIGHT  Final Result    DG Hip Unilat  With Pelvis 2-3 Views Right  Final Result      Scheduled Meds:  amLODipine  2.5 mg Oral QPM   [START ON 08/14/2020] aspirin EC  325 mg Oral Q breakfast   docusate sodium  100 mg Oral BID   fentaNYL       levothyroxine  112 mcg Oral QAC breakfast   ondansetron        sodium chloride flush  3 mL Intravenous Q12H   PRN Meds: acetaminophen, HYDROcodone-acetaminophen, menthol-cetylpyridinium **OR** phenol, methocarbamol **OR** methocarbamol (ROBAXIN) IV, metoCLOPramide **OR** metoCLOPramide (REGLAN) injection, morphine injection, ondansetron (ZOFRAN) IV, prochlorperazine Continuous Infusions:  ceFAZolin      ceFAZolin (ANCEF) IV     methocarbamol (ROBAXIN) IV 500 mg (08/13/20 1014)     LOS: 0 days  Time spent: Greater than 50% of Tanya 35 minute visit was spent in counseling/coordination of care for Tanya [name] as laid out in Tanya A&P.   Dwyane Dee, MD [facility] 08/13/2020, 12:16 PM

## 2020-08-14 LAB — CBC WITH DIFFERENTIAL/PLATELET
Abs Immature Granulocytes: 0.02 10*3/uL (ref 0.00–0.07)
Basophils Absolute: 0 10*3/uL (ref 0.0–0.1)
Basophils Relative: 0 %
Eosinophils Absolute: 0 10*3/uL (ref 0.0–0.5)
Eosinophils Relative: 0 %
HCT: 27.1 % — ABNORMAL LOW (ref 36.0–46.0)
Hemoglobin: 8.6 g/dL — ABNORMAL LOW (ref 12.0–15.0)
Immature Granulocytes: 0 %
Lymphocytes Relative: 17 %
Lymphs Abs: 1.3 10*3/uL (ref 0.7–4.0)
MCH: 27.9 pg (ref 26.0–34.0)
MCHC: 31.7 g/dL (ref 30.0–36.0)
MCV: 88 fL (ref 80.0–100.0)
Monocytes Absolute: 1.5 10*3/uL — ABNORMAL HIGH (ref 0.1–1.0)
Monocytes Relative: 20 %
Neutro Abs: 4.6 10*3/uL (ref 1.7–7.7)
Neutrophils Relative %: 63 %
Platelets: 240 10*3/uL (ref 150–400)
RBC: 3.08 MIL/uL — ABNORMAL LOW (ref 3.87–5.11)
RDW: 14.6 % (ref 11.5–15.5)
WBC: 7.5 10*3/uL (ref 4.0–10.5)
nRBC: 0 % (ref 0.0–0.2)

## 2020-08-14 LAB — T3: T3, Total: 41 ng/dL — ABNORMAL LOW (ref 71–180)

## 2020-08-14 LAB — BASIC METABOLIC PANEL
Anion gap: 6 (ref 5–15)
BUN: 14 mg/dL (ref 8–23)
CO2: 26 mmol/L (ref 22–32)
Calcium: 8.3 mg/dL — ABNORMAL LOW (ref 8.9–10.3)
Chloride: 98 mmol/L (ref 98–111)
Creatinine, Ser: 0.67 mg/dL (ref 0.44–1.00)
GFR, Estimated: >60 mL/min (ref >60)
Glucose, Bld: 94 mg/dL (ref 70–99)
Potassium: 4 mmol/L (ref 3.5–5.1)
Sodium: 130 mmol/L — ABNORMAL LOW (ref 135–145)

## 2020-08-14 LAB — MAGNESIUM: Magnesium: 1.8 mg/dL (ref 1.7–2.4)

## 2020-08-14 MED ORDER — VITAMIN B-12 100 MCG PO TABS
100.0000 ug | ORAL_TABLET | Freq: Every day | ORAL | Status: DC
  Administered 2020-08-15 – 2020-08-18 (×4): 100 ug via ORAL
  Filled 2020-08-14 (×5): qty 1

## 2020-08-14 MED ORDER — LEVOTHYROXINE SODIUM 25 MCG PO TABS
125.0000 ug | ORAL_TABLET | Freq: Every day | ORAL | Status: DC
  Administered 2020-08-15 – 2020-08-18 (×4): 125 ug via ORAL
  Filled 2020-08-14 (×4): qty 1

## 2020-08-14 NOTE — NC FL2 (Signed)
[place] LEVEL OF CARE SCREENING TOOL     IDENTIFICATION  Patient Name: Tanya [name] Birthdate: 1925/03/09 Sex: female Admission Date (Current Location): 08/12/2020  [facility] and [place] Number:  Herbalist and Address:  [facility],  [place], [place]      Provider Number: [id number]  Attending Physician Name and Address:  Dwyane Dee, MD  Relative Name and Phone Number:  Sylvette Morace (son) Ph: [phone]    Current Level of Care: Hospital Recommended Level of Care: [place] Prior Approval Number:    Date Approved/Denied:   PASRR Number: [id number]  Discharge Plan: SNF    Current Diagnoses: Patient Active Problem List   Diagnosis Date Noted   Acute respiratory failure with hypoxia ([place]) 08/13/2020   Hip fracture ([place]) 08/13/2020   Intertrochanteric fracture of right femur ([employer]) 08/12/2020   HTN (hypertension) 08/12/2020   B12 deficiency 08/12/2020   History of TIA (transient ischemic attack) 08/12/2020   Hypothyroidism     Orientation RESPIRATION BLADDER Height & Weight     Self, Time, Situation, Place  Normal Continent Weight: 137 lb 12.6 oz (62.5 kg) Height:  '5\' 2"'$  (157.5 cm)  BEHAVIORAL SYMPTOMS/MOOD NEUROLOGICAL BOWEL NUTRITION STATUS      Continent Diet (Regular diet)  AMBULATORY STATUS COMMUNICATION OF NEEDS Skin   Extensive Assist Verbally Surgical wounds                       Personal Care Assistance Level of Assistance  Bathing, Feeding, Dressing Bathing Assistance: Limited assistance Feeding assistance: Independent Dressing Assistance: Limited assistance     Functional Limitations Info  Sight, Hearing, Speech Sight Info: Adequate Hearing Info: Adequate Speech Info: Adequate    SPECIAL CARE FACTORS FREQUENCY  PT (By licensed PT), OT (By licensed OT)     PT Frequency: 5x's/week OT Frequency: 5x's/week            Contractures Contractures Info: Not  present    Additional Factors Info  Code Status, Allergies Code Status Info: Full Allergies Info: Codeine, Shellfish Allergy, Shellfish-derived Products, Amoxicillin           Current Medications (08/14/2020):  This is the current hospital active medication list Current Facility-Administered Medications  Medication Dose Route Frequency Provider Last Rate Last Admin   acetaminophen (TYLENOL) tablet 650 mg  650 mg Oral Q4H PRN Aluisio, Pilar Plate, MD       amLODipine (NORVASC) tablet 2.5 mg  2.5 mg Oral QPM Aluisio, Pilar Plate, MD   2.5 mg at 08/13/20 1700   aspirin EC tablet 325 mg  325 mg Oral Q breakfast Gaynelle Arabian, MD   325 mg at 08/14/20 0950   docusate sodium (COLACE) capsule 100 mg  100 mg Oral BID Gaynelle Arabian, MD   100 mg at 08/14/20 0950   HYDROcodone-acetaminophen (NORCO/VICODIN) 5-325 MG per tablet 1 tablet  1 tablet Oral Q4H PRN Gaynelle Arabian, MD   1 tablet at 08/14/20 0950   [START ON 08/15/2020] levothyroxine (SYNTHROID) tablet 125 mcg  125 mcg Oral QAC breakfast Dwyane Dee, MD       menthol-cetylpyridinium (CEPACOL) lozenge 3 mg  1 lozenge Oral PRN Gaynelle Arabian, MD       Or   phenol (CHLORASEPTIC) mouth spray 1 spray  1 spray Mouth/Throat PRN Aluisio, Pilar Plate, MD       methocarbamol (ROBAXIN) tablet 500 mg  500 mg Oral Q6H PRN Gaynelle Arabian, MD  Or   methocarbamol (ROBAXIN) 500 mg in dextrose 5 % 50 mL IVPB  500 mg Intravenous Q6H PRN Gaynelle Arabian, MD 100 mL/hr at 08/13/20 1014 500 mg at 08/13/20 1014   metoCLOPramide (REGLAN) tablet 5-10 mg  5-10 mg Oral Q8H PRN Gaynelle Arabian, MD       Or   metoCLOPramide (REGLAN) injection 5-10 mg  5-10 mg Intravenous Q8H PRN Aluisio, Pilar Plate, MD       morphine 4 MG/ML injection 1 mg  1 mg Intravenous Q2H PRN Aluisio, Pilar Plate, MD       ondansetron [facility]) injection 4 mg  4 mg Intravenous Q6H PRN Gaynelle Arabian, MD   4 mg at 08/13/20 0126   prochlorperazine (COMPAZINE) injection 10 mg  10 mg Intravenous Q4H PRN Aluisio, Pilar Plate, MD        sodium chloride flush (NS) 0.9 % injection 3 mL  3 mL Intravenous Q12H Gaynelle Arabian, MD   3 mL at 08/14/20 [id number]   vitamin B-12 (CYANOCOBALAMIN) tablet 100 mcg  100 mcg Oral Daily Dwyane Dee, MD         Discharge Medications: Please see discharge summary for a list of discharge medications.  Relevant Imaging Results:  Relevant Lab Results:   Additional Information SSN: [id number]  Sherie Don, LCSW

## 2020-08-14 NOTE — Plan of Care (Signed)
  Problem: Nutrition: Goal: Adequate nutrition will be maintained Outcome: Progressing   Problem: Coping: Goal: Level of anxiety will decrease Outcome: Progressing   Problem: Elimination: Goal: Will not experience complications related to urinary retention Outcome: Progressing   Problem: Pain Managment: Goal: General experience of comfort will improve Outcome: Progressing   

## 2020-08-14 NOTE — Progress Notes (Signed)
   Subjective: 1 Day Post-Op Procedure(s) (LRB): INTRAMEDULLARY (IM) NAIL INTERTROCHANTRIC (Right) Patient reports pain as mild.   Patient seen in rounds by Dr. Wynelle Link. Patient is well, and has had no acute complaints or problems. Denies chest pain or SOB.  Objective: Vital signs in last 24 hours: Temp:  [97.3 F (36.3 C)-98.7 F (37.1 C)] 98 F (36.7 C) (08/08 0527) Pulse Rate:  [77-93] 93 (08/08 0527) Resp:  [11-16] 16 (08/08 0527) BP: (103-138)/(49-79) 135/56 (08/08 0527) SpO2:  [93 %-98 %] 97 % (08/08 0527)  Intake/Output from previous day:  Intake/Output Summary (Last 24 hours) at 08/14/2020 0750 Last data filed at 08/14/2020 0600 Gross per 24 hour  Intake 2716.58 ml  Output 800 ml  Net 1916.58 ml    Intake/Output this shift: No intake/output data recorded.  Labs: Recent Labs    08/12/20 1151 08/13/20 0230  HGB 12.1 11.5*   Recent Labs    08/12/20 1151 08/13/20 0230  WBC 4.9 6.1  RBC 4.30 4.10  HCT 37.2 35.0*  PLT 356 310   Recent Labs    08/13/20 0230 08/14/20 0321  NA 128* 130*  K 3.8 4.0  CL 95* 98  CO2 25 26  BUN 14 14  CREATININE 0.57 0.67  GLUCOSE 128* 94  CALCIUM 8.8* 8.3*   Recent Labs    08/12/20 1151  INR 1.0    Exam: General - Patient is Alert Extremity - Neurologically intact Neurovascular intact Sensation intact distally Dorsiflexion/Plantar flexion intact Dressing/Incision - clean, dry, no drainage Motor Function - intact, moving foot and toes well on exam.   Past Medical History:  Diagnosis Date   Hearing loss    Hypothyroidism    Neutropenia (HCC)    Osteopenia    Primary osteoarthritis    Tremor     Assessment/Plan: 1 Day Post-Op Procedure(s) (LRB): INTRAMEDULLARY (IM) NAIL INTERTROCHANTRIC (Right) Principal Problem:   Intertrochanteric fracture of right femur (HCC) Active Problems:   Hypothyroidism   HTN (hypertension)   B12 deficiency   History of TIA (transient ischemic attack)   Acute respiratory  failure with hypoxia (HCC)   Hip fracture (HCC)  Estimated body mass index is 25.2 kg/m as calculated from the following:   Height as of this encounter: '5\' 2"'$  (1.575 m).   Weight as of this encounter: 62.5 kg. Up with therapy  DVT Prophylaxis - Aspirin Weight-bearing as tolerated  Disposition per the medical team  Theresa Duty, PA-C Orthopedic Surgery [phone] 08/14/2020, 7:50 AM

## 2020-08-14 NOTE — Progress Notes (Signed)
Progress Note    Tanya [name]   [id number]  DOB: 1925-08-24  DOA: 08/12/2020     1  PCP: Tanya Pretty, MD  Initial CC: right hip pain; fall at home  Hospital Course: [name] is a 87 year old female with PMH TIA, vitamin B12 deficiency, mild cognitive impairment who presented to the ER after mechanical fall at home. She states that she was watching TV then upon standing up she saw a bug and lifted her right leg to step on it when she lost her footing and fell onto her right side.  She began experiencing significant right-sided hip pain after her fall. X-rays on work-up in the ER showed an intertrochanteric right femur fracture with slight varus angulation. She was transferred to [facility] for orthopedic surgery evaluation and probable surgical repair. Typically she is very mobile and ambulates well at home.  She can climb stairs with no difficulty nor any associated chest pain or shortness of breath.  No known cardiac conditions.  Had left shoulder surgery several years ago but no known issues with anesthesia at that time either. She underwent IM nail fixation on 08/13/20 with orthopedic surgery.   Interval History:  No events overnight.  Pain is tolerable and she is planning to work with physical therapy today.  Discussed increase of Synthroid dosing today bedside with patient and daughter.  ROS: Constitutional: negative for chills and fevers, Respiratory: negative for cough and sputum, Cardiovascular: negative for chest pain, and Gastrointestinal: negative for abdominal pain  Assessment & Plan: * Intertrochanteric fracture of right femur (HCC) - s/p mechanical fall at home per story - xray shows "Intertrochanteric RIGHT femur fracture with slight varus angulation" - continue pain control  - asa 325 mg daily per ortho for DVT ppx starting 8/8 - PT starting 8/8  Acute respiratory failure with hypoxia (HCC) - likely from pain medication/over sedation; will be cautious as  able treating pain - wean O2 as able - continue aggressive incentive spirometry use; likely some atelectasis from pain meds and surgery; not on home O2 at baseline  Hypothyroidism - check TSH (24.653); confirmed with patient she is taking properly and is compliant - check FT4 (0.6, low) and TT3 (still in process) - increase synthroid from 112 to 125 mcg daily; discussed with daughter and patient bedside  - repeat TSH in 4-6 weeks  History of TIA (transient ischemic attack) - seen by Dr. Leonie [name] on 02/28/20 - on aspirin 81 mg daily for secondary stroke prevention; on hold in anticipation of surgery; see fracture   B12 deficiency - last B12 level 185 in 06/21/19 - on B12 supplement at home - check B12 level (1201) and will continue in hospital   HTN (hypertension) - continue amlodipine    Old records reviewed in assessment of this patient  Antimicrobials:   DVT prophylaxis: SCDs Start: 08/13/20 0950 SCDs Start: 08/12/20 1632   Code Status:   Code Status: Full Code Family Communication: daughter  Disposition Plan: Status is: Inpatient  Remains inpatient appropriate because:Ongoing active pain requiring inpatient pain management, Unsafe d/c plan, and Inpatient level of care appropriate due to severity of illness  Dispo: The patient is from: Home              Anticipated d/c is to:  likely rehab              Patient currently is not medically stable to d/c.   Difficult to place patient No  Risk of unplanned readmission score:  Unplanned Admission- Pilot do not use: 9.28   Objective: Blood pressure (!) 135/56, pulse 93, temperature 98 F (36.7 C), temperature source Oral, resp. rate 16, height '5\' 2"'$  (1.575 m), weight 62.5 kg, SpO2 97 %.  Examination: General appearance: alert, cooperative, and no distress; oxygen in place Head: Normocephalic, without obvious abnormality, atraumatic Eyes:  EOMI Lungs: clear to auscultation bilaterally Heart: regular rate and rhythm and S1,  S2 normal Abdomen: normal findings: bowel sounds normal and soft, non-tender Extremities: Right lower extremity surgical dressings in place and no surrounding edema.  Compartments soft Skin: mobility and turgor normal Neurologic: no focal deficits; RLE limited by pain  Consultants:  Orthopedic surgery  Procedures:  IM nail fixation, 08/13/2020  Data Reviewed: I have personally reviewed following labs and imaging studies Results for orders placed or performed during the hospital encounter of 08/12/20 (from the past 24 hour(s))  Basic metabolic panel     Status: Abnormal   Collection Time: 08/14/20  3:21 AM  Result Value Ref Range   Sodium 130 (L) 135 - 145 mmol/L   Potassium 4.0 3.5 - 5.1 mmol/L   Chloride 98 98 - 111 mmol/L   CO2 26 22 - 32 mmol/L   Glucose, Bld 94 70 - 99 mg/dL   BUN 14 8 - 23 mg/dL   Creatinine, Ser 0.67 0.44 - 1.00 mg/dL   Calcium 8.3 (L) 8.9 - 10.3 mg/dL   GFR, Estimated >60 >60 mL/min   Anion gap 6 5 - 15  Magnesium     Status: None   Collection Time: 08/14/20  3:21 AM  Result Value Ref Range   Magnesium 1.8 1.7 - 2.4 mg/dL  CBC with Differential/Platelet     Status: Abnormal   Collection Time: 08/14/20  4:26 AM  Result Value Ref Range   WBC 7.5 4.0 - 10.5 K/uL   RBC 3.08 (L) 3.87 - 5.11 MIL/uL   Hemoglobin 8.6 (L) 12.0 - 15.0 g/dL   HCT 27.1 (L) 36.0 - 46.0 %   MCV 88.0 80.0 - 100.0 fL   MCH 27.9 26.0 - 34.0 pg   MCHC 31.7 30.0 - 36.0 g/dL   RDW 14.6 11.5 - 15.5 %   Platelets 240 150 - 400 K/uL   nRBC 0.0 0.0 - 0.2 %   Neutrophils Relative % 63 %   Neutro Abs 4.6 1.7 - 7.7 K/uL   Lymphocytes Relative 17 %   Lymphs Abs 1.3 0.7 - 4.0 K/uL   Monocytes Relative 20 %   Monocytes Absolute 1.5 (H) 0.1 - 1.0 K/uL   Eosinophils Relative 0 %   Eosinophils Absolute 0.0 0.0 - 0.5 K/uL   Basophils Relative 0 %   Basophils Absolute 0.0 0.0 - 0.1 K/uL   Immature Granulocytes 0 %   Abs Immature Granulocytes 0.02 0.00 - 0.07 K/uL    Recent Results (from  the past 240 hour(s))  Resp Panel by RT-PCR (Flu A&B, Covid) Nasopharyngeal Swab     Status: None   Collection Time: 08/12/20 12:11 PM   Specimen: Nasopharyngeal Swab; Nasopharyngeal(NP) swabs in vial transport medium  Result Value Ref Range Status   SARS Coronavirus 2 by RT PCR NEGATIVE NEGATIVE Final    Comment: (NOTE) SARS-CoV-2 target nucleic acids are NOT DETECTED.  The SARS-CoV-2 RNA is generally detectable in upper respiratory specimens during the acute phase of infection. The lowest concentration of SARS-CoV-2 viral copies this assay can detect is 138 copies/mL. A negative result does not preclude SARS-Cov-2 infection and  should not be used as the sole basis for treatment or other patient management decisions. A negative result may occur with  improper specimen collection/handling, submission of specimen other than nasopharyngeal swab, presence of viral mutation(s) within the areas targeted by this assay, and inadequate number of viral copies(<138 copies/mL). A negative result must be combined with clinical observations, patient history, and epidemiological information. The expected result is Negative.  Fact Sheet for Patients:  EntrepreneurPulse.com.au  Fact Sheet for Healthcare Providers:  IncredibleEmployment.be  This test is no t yet approved or cleared by the [place] FDA and  has been authorized for detection and/or diagnosis of SARS-CoV-2 by FDA under an Emergency Use Authorization (EUA). This EUA will remain  in effect (meaning this test can be used) for the duration of the COVID-19 declaration under Section 564(b)(1) of the Act, 21 U.S.C.section 360bbb-3(b)(1), unless the authorization is terminated  or revoked sooner.       Influenza A by PCR NEGATIVE NEGATIVE Final   Influenza B by PCR NEGATIVE NEGATIVE Final    Comment: (NOTE) The Xpert Xpress SARS-CoV-2/FLU/RSV plus assay is intended as an aid in the diagnosis of  influenza from Nasopharyngeal swab specimens and should not be used as a sole basis for treatment. Nasal washings and aspirates are unacceptable for Xpert Xpress SARS-CoV-2/FLU/RSV testing.  Fact Sheet for Patients: EntrepreneurPulse.com.au  Fact Sheet for Healthcare Providers: IncredibleEmployment.be  This test is not yet approved or cleared by the [place] FDA and has been authorized for detection and/or diagnosis of SARS-CoV-2 by FDA under an Emergency Use Authorization (EUA). This EUA will remain in effect (meaning this test can be used) for the duration of the COVID-19 declaration under Section 564(b)(1) of the Act, 21 U.S.C. section 360bbb-3(b)(1), unless the authorization is terminated or revoked.  Performed at [facility], [address] [place], [place] [address]   Surgical pcr screen     Status: None   Collection Time: 08/12/20  5:32 PM   Specimen: Nasal Mucosa; Nasal Swab  Result Value Ref Range Status   MRSA, PCR NEGATIVE NEGATIVE Final   Staphylococcus aureus NEGATIVE NEGATIVE Final    Comment: (NOTE) The Xpert SA Assay (FDA approved for NASAL specimens in patients 68 years of age and older), is one component of a comprehensive surveillance program. It is not intended to diagnose infection nor to guide or monitor treatment. Performed at [facility], [place] [address], [place], [place] [address]      Radiology Studies: DG C-Arm 1-60 Min-No Report  Result Date: 08/13/2020 Fluoroscopy was utilized by the requesting physician.  No radiographic interpretation.   DG HIP OPERATIVE UNILAT W OR W/O PELVIS RIGHT  Result Date: 08/13/2020 CLINICAL DATA:  Surgery, elective Z41.9 (ICD-10-CM) EXAM: OPERATIVE RIGHT HIP (WITH PELVIS IF PERFORMED) 3 VIEWS TECHNIQUE: Fluoroscopic spot image(s) were submitted for interpretation post-operatively. COMPARISON:  08/12/2020. FINDINGS: Fluoro time: 26 seconds  Reported radiation dose: 4.9 mGy Three C-arm fluoroscopic images were obtained intraoperatively and submitted for post operative interpretation. These images demonstrate intramedullary nail and screw fixation of an intertrochanteric right femur fracture with near anatomic alignment. Please see the performing provider's procedural report for further detail. IMPRESSION: Intraoperative fluoroscopy, as detailed above. Electronically Signed   By: Margaretha Sheffield MD   On: 08/13/2020 09:08   DG C-Arm 1-60 Min-No Report  Final Result    DG HIP OPERATIVE UNILAT W OR W/O PELVIS RIGHT  Final Result    DG Hip Unilat  With Pelvis 2-3  Views Right  Final Result      Scheduled Meds:  amLODipine  2.5 mg Oral QPM   aspirin EC  325 mg Oral Q breakfast   docusate sodium  100 mg Oral BID   [START ON 08/15/2020] levothyroxine  125 mcg Oral QAC breakfast   sodium chloride flush  3 mL Intravenous Q12H   vitamin B-12  100 mcg Oral Daily   PRN Meds: acetaminophen, HYDROcodone-acetaminophen, menthol-cetylpyridinium **OR** phenol, methocarbamol **OR** methocarbamol (ROBAXIN) IV, metoCLOPramide **OR** metoCLOPramide (REGLAN) injection, morphine injection, ondansetron (ZOFRAN) IV, prochlorperazine Continuous Infusions:  methocarbamol (ROBAXIN) IV 500 mg (08/13/20 1014)     LOS: 1 day  Time spent: Greater than 50% of the 35 minute visit was spent in counseling/coordination of care for the patient as laid out in the A&P.   Dwyane Dee, MD [facility] 08/14/2020, 12:58 PM

## 2020-08-14 NOTE — Evaluation (Signed)
Physical Therapy Evaluation Patient Details Name: Tanya [name] MRN: [id number] DOB: [birth year] Today's Date: 08/14/2020   History of Present Illness  87 year old female with PMH TIA, vitamin B12 deficiency, mild cognitive impairment who presented to the ER after mechanical fall at home. X-rays showed  intertrochanteric right femur fracture with slight varus angulation.  She was transferred to [facility] for orthopedic surgery evaluation. pt is now s/p IM nail RLE  Clinical Impression  Pt admitted with above diagnosis.  Pt mod assist overall for transfers today, is independent at her baseline. Recommend SNF post acute to maximize independence and safety. Anticipate she will progress well.   Pt currently with functional limitations due to the deficits listed below (see PT Problem List). Pt will benefit from skilled PT to increase their independence and safety with mobility to allow discharge to the venue listed below.       Follow Up Recommendations SNF    Equipment Recommendations  None recommended by PT    Recommendations for Other Services       Precautions / Restrictions Precautions Precautions: Fall Restrictions Weight Bearing Restrictions: No Other Position/Activity Restrictions: WBAT      Mobility  Bed Mobility Overal bed mobility: Needs Assistance Bed Mobility: Supine to Sit     Supine to sit: Mod assist     General bed mobility comments: assist with RLE and trunk, bed pad used to assist scooting to EOB, incr time    Transfers Overall transfer level: Needs assistance Equipment used: Rolling walker (2 wheeled) Transfers: Sit to/from [facility] Sit to Stand: Mod assist;Min assist Stand pivot transfers: Mod assist       General transfer comment: pt requiring mod assist to stand on initial trial, pivot to [facility] with mod assist. on second standing trial pt min assist, min/mod to pivot to recliner. cues for sequence, use of RW/UEs to assist with wt  shift and pain control  Ambulation/Gait             General Gait Details: pivotal and lateral steps. limited by dizziness.  BP 122/61 after seated and reclined  Stairs            Wheelchair Mobility    Modified Rankin (Stroke Patients Only)       Balance Overall balance assessment: Needs assistance;History of Falls Sitting-balance support: No upper extremity supported;Feet supported Sitting balance-Leahy Scale: Fair       Standing balance-Leahy Scale: Poor Standing balance comment: reliant on UEs and external support                             Pertinent Vitals/Pain Pain Assessment: Faces Faces Pain Scale: Hurts little more Pain Location: R hip Pain Descriptors / Indicators: Grimacing;Sore Pain Intervention(s): Premedicated before session;Repositioned;Monitored during session;Limited activity within patient's tolerance    Home Living Family/patient expects to be discharged to:: Skilled nursing facility Living Arrangements: Alone Available Help at Discharge: Family Type of Home: House Home Access: Stairs to enter   [employer] of Steps: 1 Home Layout: One level Home Equipment: Environmental consultant - 2 wheels;Cane - single point      Prior Function Level of Independence: Independent         Comments: amb with cane most of the time     Hand Dominance        Extremity/Trunk Assessment   Upper Extremity Assessment Upper Extremity Assessment: Overall WFL for tasks assessed    Lower Extremity Assessment Lower  Extremity Assessment: RLE deficits/detail RLE Deficits / Details: ankle WFL, knee and hip 2+/5, limited by post op pain and weakness       Communication   Communication: No difficulties  Cognition Arousal/Alertness: Awake/alert Behavior During Therapy: WFL for tasks assessed/performed Overall Cognitive Status: History of cognitive impairments - at baseline                                 General Comments:  "mild cognitive impairment" per hx; pt does not recall having surgery      General Comments      Exercises General Exercises - Lower Extremity Ankle Circles/Pumps: AROM;10 reps;Both   Assessment/Plan    PT Assessment Patient needs continued PT services  PT Problem List Decreased strength;Decreased activity tolerance;Decreased range of motion;Decreased mobility;Pain;Decreased balance;Decreased knowledge of use of DME       PT Treatment Interventions DME instruction;Therapeutic exercise;Gait training;Functional mobility training;Therapeutic activities;Patient/family education    PT Goals (Current goals can be found in the Care Plan section)  Acute Rehab PT Goals Patient Stated Goal: get better PT Goal Formulation: With patient Time For Goal Achievement: 08/28/20 Potential to Achieve Goals: Good    Frequency Min 3X/week   Barriers to discharge        Co-evaluation               AM-PAC PT "6 Clicks" Mobility  Outcome Measure Help needed turning from your back to your side while in a flat bed without using bedrails?: A Little Help needed moving from lying on your back to sitting on the side of a flat bed without using bedrails?: A Little Help needed moving to and from a bed to a chair (including a wheelchair)?: A Little Help needed standing up from a chair using your arms (e.g., wheelchair or bedside chair)?: A Little Help needed to walk in hospital room?: A Lot Help needed climbing 3-5 steps with a railing? : A Lot 6 Click Score: 16    End of Session Equipment Utilized During Treatment: Gait belt Activity Tolerance: Patient tolerated treatment well Patient left: with call bell/phone within reach;in chair;with family/visitor present;with chair alarm set   PT Visit Diagnosis: Other abnormalities of gait and mobility (R26.89);Difficulty in walking, not elsewhere classified (R26.2)    Time: 1125-1201 PT Time Calculation (min) (ACUTE ONLY): 36 min   Charges:   PT  Evaluation $PT Eval Low Complexity: 1 Low PT Treatments $Therapeutic Activity: 8-22 mins        Baxter Flattery, PT  Acute Rehab Dept ([place]) [phone] Pager [phone]  08/14/2020   [facility] 08/14/2020, 1:51 PM

## 2020-08-14 NOTE — TOC Initial Note (Signed)
Transition of Care [facility]) - Initial/Assessment Note   Patient Details  Name: Tanya [name] MRN: [id number] Date of Birth: September 02, 1925  [facility] CM/SW Contact:    Sherie Don, LCSW Phone Number: 08/14/2020, 2:49 PM  Clinical Narrative: PT evaluation recommended SNF. CSW spoke with patient's daughter-in-law, Marlowe Kays, and confirmed the family was planning on SNF. Patient is vaccinated, but not boosted for SNF.  FL2 done; PASRR received. Initial referral faxed out. TOC awaiting bed offers.  Expected Discharge Plan: Skilled Nursing Facility Barriers to Discharge: Continued Medical Work up, SNF Pending bed offer, Insurance Authorization  Patient Goals and CMS Choice Patient states their goals for this hospitalization and ongoing recovery are:: Go to rehab CMS Medicare.gov Compare Post Acute Care list provided to:: Patient Represenative (must comment) Choice offered to / list presented to : Adult Children Marlowe Kays (daughter-in-law))  Expected Discharge Plan and Services Expected Discharge Plan: [place] In-house Referral: Clinical Social Work Post Acute Care Choice: [address] Living arrangements for the past 2 months: [place]              DME Arranged: N/A DME Agency: NA  Prior Living Arrangements/Services Living arrangements for the past 2 months: Single Family Home Patient language and need for interpreter reviewed:: Yes Do you feel safe going back to the place where you live?: Yes      Need for Family Participation in Patient Care: Yes (Comment) Care giver support system in place?: Yes (comment) Criminal Activity/Legal Involvement Pertinent to Current Situation/Hospitalization: No - Comment as needed  Activities of Daily Living Home Assistive Devices/Equipment: Gilford Rile (specify type) (4 wheels,) ADL Screening (condition at time of admission) Patient's cognitive ability adequate to safely complete daily activities?: Yes Is the  patient deaf or have difficulty hearing?: Yes Does the patient have difficulty seeing, even when wearing glasses/contacts?: No Does the patient have difficulty concentrating, remembering, or making decisions?: No Patient able to express need for assistance with ADLs?: Yes Does the patient have difficulty dressing or bathing?: No Independently performs ADLs?: Yes (appropriate for developmental age) Does the patient have difficulty walking or climbing stairs?: No Weakness of Legs: None Weakness of Arms/Hands: None  Permission Sought/Granted Permission sought to share information with : Facility Art therapist granted to share information with : Yes, Verbal Permission Granted Permission granted to share info w AGENCY: SNFs  Emotional Assessment Orientation: : Oriented to Self, Oriented to Place, Oriented to  Time, Oriented to Situation Alcohol / Substance Use: Not Applicable Psych Involvement: No (comment)  Admission diagnosis:  Hip fracture ([place]) [S72.009A] Closed fracture of right hip, initial encounter ([place]) [S72.001A] Patient Active Problem List   Diagnosis Date Noted   Acute respiratory failure with hypoxia ([place]) 08/13/2020   Hip fracture ([place]) 08/13/2020   Intertrochanteric fracture of right femur ([place]) 08/12/2020   HTN (hypertension) 08/12/2020   B12 deficiency 08/12/2020   History of TIA (transient ischemic attack) 08/12/2020   Hypothyroidism    PCP:  [name], MD Pharmacy:   [facility], [address] AT [facility] [address] [place] [address] Phone: [phone] [phone]  Readmission Risk Interventions No flowsheet data found.

## 2020-08-14 NOTE — Progress Notes (Signed)
Lab called to inform that hgb dropped to 8.3 (8/8) from 11.5 (8/7) at 0350. Incision looks clean dry and intact. No drainage noted. Paged the [facility] floor coverage MD Hal Hope. MD ordered a CBG stat for 0500. Still waiting for results.

## 2020-08-15 ENCOUNTER — Other Ambulatory Visit (HOSPITAL_COMMUNITY): Payer: PPO

## 2020-08-15 ENCOUNTER — Inpatient Hospital Stay (HOSPITAL_COMMUNITY): Payer: PPO

## 2020-08-15 DIAGNOSIS — I639 Cerebral infarction, unspecified: Secondary | ICD-10-CM

## 2020-08-15 DIAGNOSIS — R471 Dysarthria and anarthria: Secondary | ICD-10-CM

## 2020-08-15 DIAGNOSIS — S72144A Nondisplaced intertrochanteric fracture of right femur, initial encounter for closed fracture: Secondary | ICD-10-CM

## 2020-08-15 LAB — BASIC METABOLIC PANEL
Anion gap: 6 (ref 5–15)
BUN: 13 mg/dL (ref 8–23)
CO2: 28 mmol/L (ref 22–32)
Calcium: 8.1 mg/dL — ABNORMAL LOW (ref 8.9–10.3)
Chloride: 97 mmol/L — ABNORMAL LOW (ref 98–111)
Creatinine, Ser: 0.58 mg/dL (ref 0.44–1.00)
GFR, Estimated: >60 mL/min (ref >60)
Glucose, Bld: 94 mg/dL (ref 70–99)
Potassium: 3.9 mmol/L (ref 3.5–5.1)
Sodium: 131 mmol/L — ABNORMAL LOW (ref 135–145)

## 2020-08-15 LAB — MAGNESIUM: Magnesium: 1.9 mg/dL (ref 1.7–2.4)

## 2020-08-15 LAB — CBC WITH DIFFERENTIAL/PLATELET
Abs Immature Granulocytes: 0.02 10*3/uL (ref 0.00–0.07)
Basophils Absolute: 0 10*3/uL (ref 0.0–0.1)
Basophils Relative: 1 %
Eosinophils Absolute: 0.3 10*3/uL (ref 0.0–0.5)
Eosinophils Relative: 8 %
HCT: 25 % — ABNORMAL LOW (ref 36.0–46.0)
Hemoglobin: 8.1 g/dL — ABNORMAL LOW (ref 12.0–15.0)
Immature Granulocytes: 1 %
Lymphocytes Relative: 33 %
Lymphs Abs: 1.3 10*3/uL (ref 0.7–4.0)
MCH: 28.4 pg (ref 26.0–34.0)
MCHC: 32.4 g/dL (ref 30.0–36.0)
MCV: 87.7 fL (ref 80.0–100.0)
Monocytes Absolute: 0.8 10*3/uL (ref 0.1–1.0)
Monocytes Relative: 19 %
Neutro Abs: 1.6 10*3/uL — ABNORMAL LOW (ref 1.7–7.7)
Neutrophils Relative %: 38 %
Platelets: 222 10*3/uL (ref 150–400)
RBC: 2.85 MIL/uL — ABNORMAL LOW (ref 3.87–5.11)
RDW: 14.8 % (ref 11.5–15.5)
WBC: 4.1 10*3/uL (ref 4.0–10.5)
nRBC: 0 % (ref 0.0–0.2)

## 2020-08-15 LAB — LIPID PANEL
Cholesterol: 132 mg/dL (ref 0–200)
HDL: 50 mg/dL (ref >40)
LDL Cholesterol: 69 mg/dL (ref 0–99)
Total CHOL/HDL Ratio: 2.6 RATIO
Triglycerides: 64 mg/dL (ref <150)
VLDL: 13 mg/dL (ref 0–40)

## 2020-08-15 LAB — HEMOGLOBIN A1C
Hgb A1c MFr Bld: 5.8 % — ABNORMAL HIGH (ref 4.8–5.6)
Mean Plasma Glucose: 119.76 mg/dL

## 2020-08-15 LAB — GLUCOSE, CAPILLARY: Glucose-Capillary: 113 mg/dL — ABNORMAL HIGH (ref 70–99)

## 2020-08-15 MED ORDER — IOHEXOL 350 MG/ML SOLN
75.0000 mL | Freq: Once | INTRAVENOUS | Status: AC | PRN
  Administered 2020-08-15: 75 mL via INTRAVENOUS

## 2020-08-15 MED ORDER — STROKE: EARLY STAGES OF RECOVERY BOOK
Freq: Once | Status: AC
  Filled 2020-08-15 (×2): qty 1

## 2020-08-15 MED ORDER — HYDROCODONE-ACETAMINOPHEN 5-325 MG PO TABS: 1.0000 | ORAL_TABLET | Freq: Four times a day (QID) | ORAL | 0 refills | Status: DC | PRN

## 2020-08-15 MED ORDER — ASPIRIN 325 MG PO TBEC: 325.0000 mg | DELAYED_RELEASE_TABLET | Freq: Every day | ORAL | 0 refills | Status: DC

## 2020-08-15 MED ORDER — METHOCARBAMOL 500 MG PO TABS: 500.0000 mg | ORAL_TABLET | Freq: Four times a day (QID) | ORAL | 0 refills | Status: DC | PRN

## 2020-08-15 NOTE — Progress Notes (Signed)
PT Cancellation Note  Patient Details Name: Tanya [name] MRN: [id number] DOB: 05-20-1925   Cancelled Treatment:    Reason Eval/Treat Not Completed: Medical issues which prohibited therapy; rapid response called, pt gone for head CT at this time. Will continue efforts to see pt and update plan as needed.   [facility] 08/15/2020, 11:27 AM

## 2020-08-15 NOTE — Significant Event (Signed)
Rapid Response Event Note   Reason for Call :  Concerned about Stroke like systems slurred speech and right facial droop   Initial Focused Assessment:  Patient resting quietly in bed;  family reports that patient started to have  slurred speech and right facial droop.  Last known well at 1045. Nurse reports that patient is not currently on anticoagulation.    Interventions:  NIH scale  Code stroke called    Plan of Care:  To CT scan NIH/ exam per Dr. Cheral Marker.  Dr. Sabino Gasser to determine care for this patient.    MD Notified:  Dr. Sabino Gasser  Call [place] Time: [id number] End Time: [place], RN

## 2020-08-15 NOTE — Progress Notes (Signed)
   Subjective: 2 Days Post-Op Procedure(s) (LRB): INTRAMEDULLARY (IM) NAIL INTERTROCHANTRIC (Right) Patient reports pain as mild. Patient seen in rounds for Dr. Wynelle Link. Patient is resting comfortably in bed this AM, pain is well controlled. Began working with physical therapy yesterday, unable to ambulate due to dizziness.   Objective: Vital signs in last 24 hours: Temp:  [97.5 F (36.4 C)-98.4 F (36.9 C)] 98.4 F (36.9 C) (08/09 0630) Pulse Rate:  [85-86] 86 (08/09 0630) Resp:  [16-17] 16 (08/09 0630) BP: (99-118)/(51-82) 118/82 (08/09 0630) SpO2:  [93 %-97 %] 97 % (08/09 0630)  Intake/Output from previous day:  Intake/Output Summary (Last 24 hours) at 08/15/2020 0813 Last data filed at 08/15/2020 0630 Gross per 24 hour  Intake 660 ml  Output --  Net 660 ml    Intake/Output this shift: No intake/output data recorded.  Labs: Recent Labs    08/12/20 1151 08/13/20 0230 08/14/20 0426 08/15/20 0331  HGB 12.1 11.5* 8.6* 8.1*   Recent Labs    08/14/20 0426 08/15/20 0331  WBC 7.5 4.1  RBC 3.08* 2.85*  HCT 27.1* 25.0*  PLT 240 222   Recent Labs    08/14/20 0321 08/15/20 0331  NA 130* 131*  K 4.0 3.9  CL 98 97*  CO2 26 28  BUN 14 13  CREATININE 0.67 0.58  GLUCOSE 94 94  CALCIUM 8.3* 8.1*   Recent Labs    08/12/20 1151  INR 1.0    Exam: General - Patient is Alert and Oriented Extremity - Neurologically intact Neurovascular intact Sensation intact distally Dorsiflexion/Plantar flexion intact Dressing/Incision - clean, dry, no drainage Motor Function - intact, moving foot and toes well on exam.   Past Medical History:  Diagnosis Date   Hearing loss    Hypothyroidism    Neutropenia (HCC)    Osteopenia    Primary osteoarthritis    Tremor     Assessment/Plan: 2 Days Post-Op Procedure(s) (LRB): INTRAMEDULLARY (IM) NAIL INTERTROCHANTRIC (Right) Principal Problem:   Intertrochanteric fracture of right femur (HCC) Active Problems:    Hypothyroidism   HTN (hypertension)   B12 deficiency   History of TIA (transient ischemic attack)   Acute respiratory failure with hypoxia (HCC)   Hip fracture (HCC)  Estimated body mass index is 25.2 kg/m as calculated from the following:   Height as of this encounter: '5\' 2"'$  (1.575 m).   Weight as of this encounter: 62.5 kg. Up with therapy  DVT Prophylaxis - Aspirin Weight-bearing as tolerated  Most likely will discharge to SNF once cleared medically. Continue working with physical therapy to progress  mobility.  Scripts have been printed and placed in chart in anticipation of SNF discharge.   The PDMP database was reviewed today prior to any opioid medications being prescribed to this patient.  Theresa Duty, PA-C Orthopedic Surgery [phone] 08/15/2020, 8:13 AM

## 2020-08-15 NOTE — TOC Progression Note (Signed)
Transition of Care [facility]) - Progression Note   Patient Details  Name: Tanya [name] MRN: [id number] Date of Birth: 06-Nov-1925  Transition of Care [facility]) CM/SW Leslie, LCSW Phone Number: 08/15/2020, 1:00 PM  Clinical Narrative: CSW met with the patient and her family to review bed offers. Family selected [facility]. CSW spoke with Coquille at [facility] to confirm bed availability. Facility can accept the patient. CSW called [employer] and spoke with Tammy to start insurance authorization. Due to concerns regarding a possible stroke, patient will not discharge to SNF today. TOC to follow.  Expected Discharge Plan: [place] Barriers to Discharge: Continued Medical Work up, SNF Pending bed offer, Ship broker  Expected Discharge Plan and Services Expected Discharge Plan: [place] In-house Referral: Clinical Social Work Post Acute Care Choice: [place] Living arrangements for the past 2 months: Single Family Home Expected Discharge Date: 08/15/20               DME Arranged: N/A DME Agency: NA  Readmission Risk Interventions No flowsheet data found.

## 2020-08-15 NOTE — Progress Notes (Signed)
Progress Note    Tanya [name]   [id number]  DOB: 1925/08/21  DOA: 08/12/2020     2  PCP: Tanya Pretty, MD  Initial CC: right hip pain; fall at home  Hospital Course: Tanya [name] is a 87 year old female with PMH TIA, vitamin B12 deficiency, mild cognitive impairment who presented to the ER after mechanical fall at home. She states that she was watching TV then upon standing up she saw a bug and lifted her right leg to step on it when she lost her footing and fell onto her right side.  She began experiencing significant right-sided hip pain after her fall. X-rays on work-up in the ER showed an intertrochanteric right femur fracture with slight varus angulation. She was transferred to [facility] for orthopedic surgery evaluation and probable surgical repair. Typically she is very mobile and ambulates well at home.  She can climb stairs with no difficulty nor any associated chest pain or shortness of breath.  No known cardiac conditions.  Had left shoulder surgery several years ago but no known issues with anesthesia at that time either. She underwent IM nail fixation on 08/13/20 with orthopedic surgery.   Patient had been recovering as expected until 08/15/2020 when she developed acute onset of mild right facial droop and dysarthria.  A code stroke was initiated and she was evaluated by tele neurology.  Interval History:  No events overnight.  This morning she was doing well when seen alongside with orthopedic surgery as well.  Daughter-in-law and son were also bedside as we were anticipating pursuing further disposition to rehab; later in the morning, she developed acute onset of dysarthria and right-sided facial droop.  Code stroke was called and work-up has since been initiated.  She will transfer to progressive while further stroke work-up is commenced. Discharge on hold at this time.  ROS: Constitutional: negative for chills and fevers, Respiratory: negative for cough and sputum,  Cardiovascular: negative for chest pain, and Gastrointestinal: negative for abdominal pain  Assessment & Plan: * Intertrochanteric fracture of right femur (HCC) - s/p mechanical fall at home per story - xray shows "Intertrochanteric RIGHT femur fracture with slight varus angulation" - continue pain control  - asa 325 mg daily per ortho for DVT ppx starting 8/8 - PT starting 8/8  Dysarthria - patient developed acute onset of dysarthria and right facial droop on morning of 8/9 after ortho and hospitalist evals in the morning; she was otherwise in her usual state with son and daughter in law bedside.  - stat CT head showed no acute hemorrhage or infarct; some noted lateral and 3rd ventriculomegaly and cerebral atrophy (with findings suggestive of NPH), however this has been previously seen and evaluated by Dr. Leonie Man and her symptomatology was not felt to favor NPH - further stroke workup initiated (MRI, CTA head/neck, echo, etc) - transfer to progressive - hold off on d/c to rehab until further workup complete   Acute respiratory failure with hypoxia ([place]) - likely from pain medication/over sedation; will be cautious as able treating pain - wean O2 as able - continue aggressive incentive spirometry use; likely some atelectasis from pain meds and surgery; not on home O2 at baseline  Hypothyroidism - check TSH (24.653); confirmed with patient she is taking properly and is compliant - check FT4 (0.6, low) and TT3 (still in process) - increase synthroid from 112 to 125 mcg daily; discussed with daughter and patient bedside  - repeat TSH in 4-6 weeks  History of TIA (  transient ischemic attack) - seen by Dr. Leonie Man on 02/28/20 - on aspirin 81 mg daily for secondary stroke prevention; on hold in anticipation of surgery; see fracture   B12 deficiency - last B12 level 185 in 06/21/19 - on B12 supplement at home - check B12 level (1201) and will continue in hospital   HTN (hypertension) -  continue amlodipine    Old records reviewed in assessment of this patient  Antimicrobials:   DVT prophylaxis: SCDs Start: 08/13/20 0950 SCDs Start: 08/12/20 1632   Code Status:   Code Status: Full Code Family Communication: daughter  Disposition Plan: Status is: Inpatient  Remains inpatient appropriate because:Ongoing active pain requiring inpatient pain management, Unsafe d/c plan, and Inpatient level of care appropriate due to severity of illness  Dispo: The patient is from: Home              Anticipated d/c is to:  likely rehab              Patient currently is not medically stable to d/c.   Difficult to place patient No  Risk of unplanned readmission score: Unplanned Admission- Pilot do not use: 9.87   Objective: Blood pressure 135/73, pulse 85, temperature 98.2 F (36.8 C), temperature source Oral, resp. rate 16, height '5\' 2"'$  (1.575 m), weight 62.5 kg, SpO2 97 %.  Examination: General appearance: alert, cooperative, and no distress; oxygen in place Head: Normocephalic, without obvious abnormality, atraumatic Eyes:  EOMI Lungs: clear to auscultation bilaterally Heart: regular rate and rhythm and S1, S2 normal Abdomen: normal findings: bowel sounds normal and soft, non-tender Extremities: Right lower extremity surgical dressings in place and no surrounding edema.  Compartments soft Skin: mobility and turgor normal Neurologic: no focal deficits; RLE limited by pain; no facial droop or dysarthria when seen this morning prior to code stroke   Consultants:  Orthopedic surgery  Procedures:  IM nail fixation, 08/13/2020  Data Reviewed: I have personally reviewed following labs and imaging studies Results for orders placed or performed during the hospital encounter of 08/12/20 (from the past 24 hour(s))  Basic metabolic panel     Status: Abnormal   Collection Time: 08/15/20  3:31 AM  Result Value Ref Range   Sodium 131 (L) 135 - 145 mmol/L   Potassium 3.9 3.5 - 5.1  mmol/L   Chloride 97 (L) 98 - 111 mmol/L   CO2 28 22 - 32 mmol/L   Glucose, Bld 94 70 - 99 mg/dL   BUN 13 8 - 23 mg/dL   Creatinine, Ser 0.58 0.44 - 1.00 mg/dL   Calcium 8.1 (L) 8.9 - 10.3 mg/dL   GFR, Estimated >60 >60 mL/min   Anion gap 6 5 - 15  CBC with Differential/Platelet     Status: Abnormal   Collection Time: 08/15/20  3:31 AM  Result Value Ref Range   WBC 4.1 4.0 - 10.5 K/uL   RBC 2.85 (L) 3.87 - 5.11 MIL/uL   Hemoglobin 8.1 (L) 12.0 - 15.0 g/dL   HCT 25.0 (L) 36.0 - 46.0 %   MCV 87.7 80.0 - 100.0 fL   MCH 28.4 26.0 - 34.0 pg   MCHC 32.4 30.0 - 36.0 g/dL   RDW 14.8 11.5 - 15.5 %   Platelets 222 150 - 400 K/uL   nRBC 0.0 0.0 - 0.2 %   Neutrophils Relative % 38 %   Neutro Abs 1.6 (L) 1.7 - 7.7 K/uL   Lymphocytes Relative 33 %   Lymphs Abs 1.3 0.7 -  4.0 K/uL   Monocytes Relative 19 %   Monocytes Absolute 0.8 0.1 - 1.0 K/uL   Eosinophils Relative 8 %   Eosinophils Absolute 0.3 0.0 - 0.5 K/uL   Basophils Relative 1 %   Basophils Absolute 0.0 0.0 - 0.1 K/uL   Immature Granulocytes 1 %   Abs Immature Granulocytes 0.02 0.00 - 0.07 K/uL  Magnesium     Status: None   Collection Time: 08/15/20  3:31 AM  Result Value Ref Range   Magnesium 1.9 1.7 - 2.4 mg/dL  Glucose, capillary     Status: Abnormal   Collection Time: 08/15/20 11:10 AM  Result Value Ref Range   Glucose-Capillary 113 (H) 70 - 99 mg/dL    Recent Results (from the past 240 hour(s))  Resp Panel by RT-PCR (Flu A&B, Covid) Nasopharyngeal Swab     Status: None   Collection Time: 08/12/20 12:11 PM   Specimen: Nasopharyngeal Swab; Nasopharyngeal(NP) swabs in vial transport medium  Result Value Ref Range Status   SARS Coronavirus 2 by RT PCR NEGATIVE NEGATIVE Final    Comment: (NOTE) SARS-CoV-2 target nucleic acids are NOT DETECTED.  The SARS-CoV-2 RNA is generally detectable in upper respiratory specimens during the acute phase of infection. The lowest concentration of SARS-CoV-2 viral copies this assay can  detect is 138 copies/mL. A negative result does not preclude SARS-Cov-2 infection and should not be used as the sole basis for treatment or other patient management decisions. A negative result may occur with  improper specimen collection/handling, submission of specimen other than nasopharyngeal swab, presence of viral mutation(s) within the areas targeted by this assay, and inadequate number of viral copies(<138 copies/mL). A negative result must be combined with clinical observations, patient history, and epidemiological information. The expected result is Negative.  Fact Sheet for Patients:  EntrepreneurPulse.com.au  Fact Sheet for Healthcare Providers:  IncredibleEmployment.be  This test is no t yet approved or cleared by the [place] FDA and  has been authorized for detection and/or diagnosis of SARS-CoV-2 by FDA under an Emergency Use Authorization (EUA). This EUA will remain  in effect (meaning this test can be used) for the duration of the COVID-19 declaration under Section 564(b)(1) of the Act, 21 U.S.C.section 360bbb-3(b)(1), unless the authorization is terminated  or revoked sooner.       Influenza A by PCR NEGATIVE NEGATIVE Final   Influenza B by PCR NEGATIVE NEGATIVE Final    Comment: (NOTE) The Xpert Xpress SARS-CoV-2/FLU/RSV plus assay is intended as an aid in the diagnosis of influenza from Nasopharyngeal swab specimens and should not be used as a sole basis for treatment. Nasal washings and aspirates are unacceptable for Xpert Xpress SARS-CoV-2/FLU/RSV testing.  Fact Sheet for Patients: EntrepreneurPulse.com.au  Fact Sheet for Healthcare Providers: IncredibleEmployment.be  This test is not yet approved or cleared by the [place] FDA and has been authorized for detection and/or diagnosis of SARS-CoV-2 by FDA under an Emergency Use Authorization (EUA). This EUA will remain in  effect (meaning this test can be used) for the duration of the COVID-19 declaration under Section 564(b)(1) of the Act, 21 U.S.C. section 360bbb-3(b)(1), unless the authorization is terminated or revoked.  Performed at [facility], [address], [place], [place] [address]   Surgical pcr screen     Status: None   Collection Time: 08/12/20  5:32 PM   Specimen: Nasal Mucosa; Nasal Swab  Result Value Ref Range Status   MRSA, PCR NEGATIVE NEGATIVE Final   Staphylococcus aureus NEGATIVE  NEGATIVE Final    Comment: (NOTE) The Xpert SA Assay (FDA approved for NASAL specimens in patients 11 years of age and older), is one component of a comprehensive surveillance program. It is not intended to diagnose infection nor to guide or monitor treatment. Performed at [facility], [place] [address] [place], [place] [address]      Radiology Studies: CT HEAD CODE STROKE WO CONTRAST  Addendum Date: 08/15/2020   ADDENDUM REPORT: 08/15/2020 12:02 ADDENDUM: Following multiple attempts to reach the ordering provider, these results were discussed by telephone on 08/15/2020 at 11:58 pm with provider Jahmarion Popoff Sabino Gasser , who verbally acknowledged these results. Electronically Signed   By: Kellie Simmering DO   On: 08/15/2020 12:02   Result Date: 08/15/2020 CLINICAL DATA:  Code stroke. Neuro deficit, acute, stroke suspected. Additional provided: Fall a few days ago, right hip surgery, right facial droop. EXAM: CT HEAD WITHOUT CONTRAST TECHNIQUE: Contiguous axial images were obtained from the base of the skull through the vertex without intravenous contrast. COMPARISON:  Brain MRI 07/02/2019. MRA head 06/29/2019. Head CT 05/20/2019. FINDINGS: Brain: As before, there is lateral and third ventriculomegaly which is out of proportion to the degree of generalized cerebral atrophy, and suggestive of normal pressure hydrocephalus. Moderate/advanced patchy and confluent hypoattenuation within the  cerebral white matter, nonspecific but compatible with chronic small vessel ischemic disease. There is no acute intracranial hemorrhage. No demarcated cortical infarct. No extra-axial fluid collection. No evidence of an intracranial mass. No midline shift. Vascular: No hyperdense vessel.  Atherosclerotic calcifications. Skull: Normal. Negative for fracture or focal lesion. Sinuses/Orbits: Visualized orbits show no acute finding. No significant paranasal sinus disease. Other: Prominent torus palatinus. ASPECTS ([place] Stroke Program Early CT Score) - Ganglionic level infarction (caudate, lentiform nuclei, internal capsule, insula, M1-M3 cortex): 7 - Supraganglionic infarction (M4-M6 cortex): 3 Total score (0-10 with 10 being normal): 10 IMPRESSION: No acute intracranial hemorrhage or acute demarcated cortical infarction. ASPECTS is 10. As before, there is lateral and third ventriculomegaly which is out of proportion to the degree of generalized cerebral atrophy. 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 Moderate/advanced chronic small vessel ischemic changes within the cerebral white matter. Electronically Signed: By: Kellie Simmering DO On: 08/15/2020 11:38   CT HEAD CODE STROKE WO CONTRAST  Final Result  Addendum (preliminary) 1 of 1  ADDENDUM REPORT: 08/15/2020 12:02    ADDENDUM:  Following multiple attempts to reach the ordering provider, these  results were discussed by telephone on 08/15/2020 at 11:58 pm with  provider Yaniel Limbaugh Sabino Gasser , who verbally acknowledged these results.      Electronically Signed    By: Kellie Simmering DO    On: 08/15/2020 12:02      Final    DG C-Arm 1-60 Min-No Report  Final Result    DG HIP OPERATIVE UNILAT W OR W/O PELVIS RIGHT  Final Result    DG Hip Unilat  With Pelvis 2-3 Views Right  Final Result    CT ANGIO HEAD W OR WO CONTRAST    (Results Pending)  CT ANGIO NECK W OR WO CONTRAST    (Results Pending)  MR BRAIN WO CONTRAST    (Results  Pending)    Scheduled Meds:   stroke: mapping our early stages of recovery book   Does not apply Once   amLODipine  2.5 mg Oral QPM   aspirin EC  325 mg Oral Q breakfast   docusate sodium  100 mg Oral BID  levothyroxine  125 mcg Oral QAC breakfast   sodium chloride flush  3 mL Intravenous Q12H   vitamin B-12  100 mcg Oral Daily   PRN Meds: acetaminophen, HYDROcodone-acetaminophen, menthol-cetylpyridinium **OR** phenol, methocarbamol **OR** methocarbamol (ROBAXIN) IV, metoCLOPramide **OR** metoCLOPramide (REGLAN) injection, morphine injection, ondansetron (ZOFRAN) IV, prochlorperazine Continuous Infusions:  methocarbamol (ROBAXIN) IV 500 mg (08/13/20 1014)     LOS: 2 days  Time spent: Greater than 50% of the 35 minute visit was spent in counseling/coordination of care for the patient as laid out in the A&P.   Dwyane Dee, MD [facility] 08/15/2020, 1:16 PM

## 2020-08-15 NOTE — Consult Note (Signed)
[facility] TeleNeurology Consult Services    Date of Service:  08/15/2020    Impression: Acute onset of mild right facial droop ans slurred speech    Metrics: Last Known Well: 11 AM Symptoms: As per HPI.  Patient is not a candidate for thrombolytic. Exam findings are too mild to justify the risks associated with tPA. Has had recent surgery on right hip which presents an increased risk of hemorrhage at the surgical site.   Location of the provider: [facility]  Location of the patient: [facility] Pre-Morbid Modified Rankin Scale: No family available for detailed assessment. Has mild cognitive impairment at baseline.  This consult was provided via telemedicine with 2-way video and audio communication. The patient/family was informed that care would be provided in this way and agreed to receive care in this manner.     Assessment: 87 year old female with acute onset of dysarthria and right facial droop.   -Exam findings are suggestive of a small left MCA ischemic infarction. -CT shows no acute hemorrhage or subacute hypodensity. Widened ventricles most consistent with central atrophy, but NPH is on the radiological DDx.  -Patient is not a candidate for thrombolytic. Exam findings are too mild to justify the risks associated with tPA. Has had recent surgery on right hip which presents an increased risk of hemorrhage at the surgical site.  -Exam findings are not consistent with LVO     Recommendations: - MRI brain - TTE - CTA of head and neck - Cardiac telemetry. - HgbA1c, fasting lipid panel - MRI of the brain without contrast - PT consult, OT consult, Speech consult - Continue ASA. If MRI shows a new stroke or if CTA shows severe intracranial atherosclerosis, add Plavix to her regimen.  - Frequent neuro checks - NPO until passes stroke swallow screen      ------------------------------------------------------------------------------    History of Present Illness: 87 year old female with a PMHx of TIA, vitamine B12 deficiency, mild cognitive impairment, hearing loss, hypothyroidism, osteopenia and tremor who presented to the hospital on 8/6 after tripping and falling at home, fracturing her right hip (displaced right intertrochanteric femur fracture). She underwent intramedullary nailing on 8/7 and since then has been able to ambulate. This morning, she was noted by daughter to suddenly have slurred speech and right facial droop. LKN 11 AM. Code Stroke was called. She is on ASA. She is not currently anticoagulated.   CT head:  -No acute intracranial hemorrhage or acute demarcated cortical infarction. ASPECTS is 10. -As before, there is lateral and third ventriculomegaly which is out of proportion to the degree of generalized cerebral atrophy. 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 -Moderate/advanced chronic small vessel ischemic changes within the cerebral white matter.     Past Medical History: Past Medical History:  Diagnosis Date   Hearing loss    Hypothyroidism    Neutropenia ([place])    Osteopenia    Primary osteoarthritis    Tremor     Past Surgical History: Intramedullary nailing of intertrochanteric displaced right femur fracture on 8/7     Current Facility-Administered Medications:    acetaminophen (TYLENOL) tablet 650 mg, 650 mg, Oral, Q4H PRN, Aluisio, Frank, MD   amLODipine (NORVASC) tablet 2.5 mg, 2.5 mg, Oral, QPM, Aluisio, Frank, MD, 2.5 mg at 08/13/20 1700   aspirin EC tablet 325 mg, 325 mg, Oral, Q breakfast, Aluisio, Pilar Plate, MD, 325 mg at 08/15/20 0924   docusate sodium (COLACE) capsule 100 mg, 100  mg, Oral, BID, Gaynelle Arabian, MD, 100 mg at 08/15/20 [id number]   HYDROcodone-acetaminophen (NORCO/VICODIN) 5-325 MG per tablet 1 tablet, 1 tablet, Oral, Q4H PRN, Gaynelle Arabian, MD, 1 tablet at 08/15/20 [id number]   levothyroxine (SYNTHROID) tablet 125 mcg, 125 mcg, Oral, QAC breakfast,  Dwyane Dee, MD, 125 mcg at 08/15/20 0606   menthol-cetylpyridinium (CEPACOL) lozenge 3 mg, 1 lozenge, Oral, PRN **OR** phenol (CHLORASEPTIC) mouth spray 1 spray, 1 spray, Mouth/Throat, PRN, Aluisio, Pilar Plate, MD   methocarbamol (ROBAXIN) tablet 500 mg, 500 mg, Oral, Q6H PRN **OR** methocarbamol (ROBAXIN) 500 mg in dextrose 5 % 50 mL IVPB, 500 mg, Intravenous, Q6H PRN, Gaynelle Arabian, MD, Last Rate: 100 mL/hr at 08/13/20 1014, 500 mg at 08/13/20 1014   metoCLOPramide (REGLAN) tablet 5-10 mg, 5-10 mg, Oral, Q8H PRN **OR** metoCLOPramide (REGLAN) injection 5-10 mg, 5-10 mg, Intravenous, Q8H PRN, Aluisio, Pilar Plate, MD   morphine 4 MG/ML injection 1 mg, 1 mg, Intravenous, Q2H PRN, Aluisio, Pilar Plate, MD   ondansetron [facility]) injection 4 mg, 4 mg, Intravenous, Q6H PRN, Gaynelle Arabian, MD, 4 mg at 08/13/20 0126   prochlorperazine (COMPAZINE) injection 10 mg, 10 mg, Intravenous, Q4H PRN, Aluisio, Pilar Plate, MD   sodium chloride flush (NS) 0.9 % injection 3 mL, 3 mL, Intravenous, Q12H, Aluisio, Frank, MD, 3 mL at 08/14/20 2057   vitamin B-12 (CYANOCOBALAMIN) tablet 100 mcg, 100 mcg, Oral, Daily, Dwyane Dee, MD, 100 mcg at 08/15/20 [id number]       Social History: Minimal wine consumption No drug use Never smoker   Family History:  Reviewed in Epic   ROS: As per HPI    Anticoagulant use:  No.   Antiplatelet use: ASA   Examination:   BP 135/73 (BP Location: Right Arm)   Pulse 85   Temp 98.2 F (36.8 C) (Oral)   Resp 16   Ht '5\' 2"'$  (1.575 m)   Wt 62.5 kg   SpO2 97%   BMI 25.20 kg/m     1A: Level of Consciousness - 0 1B: Ask Month and Age - 0 1C: Blink Eyes & Squeeze Hands - 0 2: Test Horizontal Extraocular Movements - 0 3: Test Visual Fields - 0 4: Test Facial Palsy (Use Grimace if Obtunded) - 1 (RIGHT NL fold decreased) 5A: Test Left Arm Motor Drift - 0 5B: Test Right Arm Motor Drift - 0 6A: Test Left Leg Motor Drift - 0 6B: Test Right Leg Motor Drift - 0 7: Test Limb Ataxia  (FNF/Heel-Shin) - 0 8: Test Sensation -  2 (LEFT sided sensory loss) 9: Test Language/Aphasia - 0 10: Test Dysarthria - Severe Dysarthria: 1 11: Test Extinction/Inattention - Extinction to bilateral simultaneous stimulation 0   NIHSS Score: 4     Patient/Family was informed the Neurology Consult would occur via TeleHealth consult by way of interactive audio and video telecommunications and consented to receiving care in this manner.   Patient is being evaluated for possible acute neurologic impairment and high pretest probability of imminent or life-threatening deterioration. I spent total of 40 minutes providing care to this patient, including time for face to face visit via telemedicine, review of medical records, imaging studies and discussion of findings with providers, the patient and/or family.   Electronically signed: Dr. Kerney Elbe

## 2020-08-15 NOTE — Assessment & Plan Note (Signed)
-   patient developed acute onset of dysarthria and right facial droop on morning of 8/9 after ortho and hospitalist evals in the morning; she was otherwise in her usual state with son and daughter in law bedside.  - stat CT head showed no acute hemorrhage or infarct; some noted lateral and 3rd ventriculomegaly and cerebral atrophy (with findings suggestive of NPH), however this has been previously seen and evaluated by Dr. Leonie Man and her symptomatology was not felt to favor NPH - further stroke workup initiated (MRI, CTA head/neck, echo, etc) - transfer to progressive - hold off on d/c to rehab until further workup complete

## 2020-08-15 NOTE — Evaluation (Signed)
SLP Cancellation Note  Patient Details Name: Tanya [name] MRN: [id number] DOB: 25-May-1925   Cancelled treatment:       Reason Eval/Treat Not Completed: Other (comment)  Pt has passed [facility] swallow screen.  RN reports pt going to move to the floor.  Will follow up for dysarthria, speech/cognitive eval. Thanks.   Acute Rehab Services Office [phone] Pager [phone]    Macario Golds 08/15/2020, 2:48 PM

## 2020-08-15 NOTE — Discharge Instructions (Signed)
Dr. Gaynelle Arabian Total Joint Specialist [facility] [address] [place], [place] [address] [phone]  [place], Guidelines Following Surgery   HOME CARE INSTRUCTIONS  Remove items at home which could result in a fall. This includes throw rugs or furniture in walking pathways.  ICE to the affected hip every three hours for 30 minutes at a time and then as needed for pain and swelling.  Continue to use ice on the hip for pain and swelling from surgery. You may notice swelling that will progress down to the foot and ankle.  This is normal after surgery.  Elevate the leg when you are not up walking on it.   Continue to use the breathing machine which will help keep your temperature down.  It is common for your temperature to cycle up and down following surgery, especially at night when you are not up moving around and exerting yourself.  The breathing machine keeps your lungs expanded and your temperature down.  DRESSING / WOUND CARE / SHOWERING You may shower 3 days after surgery, but keep the wounds dry during showering.  NO SOAKING/SUBMERGING IN THE BATHTUB.  If the bandage gets wet, change with a clean dry gauze.  If the incision gets wet, pat the wound dry with a clean towel. You may start showering once you are discharged home but do not submerge the incision under water. Just pat the incision dry and apply a dry gauze dressing on daily. Change the surgical dressing daily and reapply a dry dressing each time.  ACTIVITY Walk with your walker as instructed. Use walker as long as suggested by your caregivers. Avoid periods of inactivity such as sitting longer than an hour when not asleep. This helps prevent blood clots.  Do not drive a car for 6 weeks or until released by you surgeon.  Do not drive while taking narcotics.  WEIGHT BEARING Weight bearing as tolerated with assist device (walker, cane, etc)  as directed, use it as long as suggested by your surgeon or therapist, typically at least 4-6 weeks.  POST-OPERATIVE OPIOID TAPER INSTRUCTIONS: It is important to wean off of your opioid medication as soon as possible. If you do not need pain medication after your surgery it is ok to stop day one. Opioids include: Codeine, Hydrocodone(Norco, Vicodin), Oxycodone(Percocet, oxycontin) and hydromorphone amongst others.  Long term and even short term use of opiods can cause: Increased pain response Dependence Constipation Depression Respiratory depression And more.  Withdrawal symptoms can include Flu like symptoms Nausea, vomiting And more Techniques to manage these symptoms Hydrate well Eat regular healthy meals Stay active Use relaxation techniques(deep breathing, meditating, yoga) Do Not substitute Alcohol to help with tapering If you have been on opioids for less than two weeks and do not have pain than it is ok to stop all together.  Plan to wean off of opioids This plan should start within one week post op of your joint replacement. Maintain the same interval or time between taking each dose and first decrease the dose.  Cut the total daily intake of opioids by one tablet each day Next start to increase the time between doses. The last dose that should be eliminated is the evening dose.   MEDICATIONS See your medication summary on the "After Visit Summary" that the nursing staff will review with you prior to discharge.  You may have some home medications which will be placed on hold until you  complete the course of blood thinner medication.  It is important for you to complete the blood thinner medication as prescribed by your surgeon.  Continue your approved medications as instructed at time of discharge.                                                 FOLLOW-UP APPOINTMENTS Make sure you keep all of your appointments after your operation with your surgeon and caregivers. You  should call the office at the above phone number and make an appointment for approximately two weeks after the date of your surgery or on the date instructed by your surgeon outlined in the "After Visit Summary".  IF YOU ARE TRANSFERRED TO A SKILLED REHAB FACILITY If the patient is transferred to a skilled rehab facility following release from the hospital, a list of the current medications will be sent to the facility for the patient to continue.  When discharged from the skilled rehab facility, please have the facility set up the patient's Marine prior to being released. Also, the skilled facility will be responsible for providing the patient with their medications at time of release from the facility to include their pain medication, the muscle relaxants, and their blood thinner medication. If the patient is still at the rehab facility at time of the two week follow up appointment, the skilled rehab facility will also need to assist the patient in arranging follow up appointment in our office and any transportation needs.  MAKE SURE YOU:  Understand these instructions.  Get help right away if you are not doing well or get worse.   Do not submerge incision under water. Please use good hand washing techniques while changing dressing each day. May shower starting three days after surgery. Please use a clean towel to pat the incision dry following showers. Continue to use ice for pain and swelling after surgery. Do not use any lotions or creams on the incision until instructed by your surgeon.

## 2020-08-16 ENCOUNTER — Inpatient Hospital Stay (HOSPITAL_COMMUNITY): Payer: PPO

## 2020-08-16 ENCOUNTER — Encounter (HOSPITAL_COMMUNITY): Payer: Self-pay | Admitting: Orthopedic Surgery

## 2020-08-16 LAB — BASIC METABOLIC PANEL
Anion gap: 2 — ABNORMAL LOW (ref 5–15)
BUN: 8 mg/dL (ref 8–23)
CO2: 32 mmol/L (ref 22–32)
Calcium: 8.3 mg/dL — ABNORMAL LOW (ref 8.9–10.3)
Chloride: 101 mmol/L (ref 98–111)
Creatinine, Ser: 0.49 mg/dL (ref 0.44–1.00)
GFR, Estimated: >60 mL/min (ref >60)
Glucose, Bld: 90 mg/dL (ref 70–99)
Potassium: 3.6 mmol/L (ref 3.5–5.1)
Sodium: 135 mmol/L (ref 135–145)

## 2020-08-16 LAB — CBC WITH DIFFERENTIAL/PLATELET
Abs Immature Granulocytes: 0.02 10*3/uL (ref 0.00–0.07)
Basophils Absolute: 0.1 10*3/uL (ref 0.0–0.1)
Basophils Relative: 1 %
Eosinophils Absolute: 0.3 10*3/uL (ref 0.0–0.5)
Eosinophils Relative: 8 %
HCT: 25.1 % — ABNORMAL LOW (ref 36.0–46.0)
Hemoglobin: 8.1 g/dL — ABNORMAL LOW (ref 12.0–15.0)
Immature Granulocytes: 1 %
Lymphocytes Relative: 36 %
Lymphs Abs: 1.4 10*3/uL (ref 0.7–4.0)
MCH: 28.4 pg (ref 26.0–34.0)
MCHC: 32.3 g/dL (ref 30.0–36.0)
MCV: 88.1 fL (ref 80.0–100.0)
Monocytes Absolute: 0.9 10*3/uL (ref 0.1–1.0)
Monocytes Relative: 22 %
Neutro Abs: 1.2 10*3/uL — ABNORMAL LOW (ref 1.7–7.7)
Neutrophils Relative %: 32 %
Platelets: 244 10*3/uL (ref 150–400)
RBC: 2.85 MIL/uL — ABNORMAL LOW (ref 3.87–5.11)
RDW: 14.8 % (ref 11.5–15.5)
WBC: 3.8 10*3/uL — ABNORMAL LOW (ref 4.0–10.5)
nRBC: 0 % (ref 0.0–0.2)

## 2020-08-16 LAB — MAGNESIUM: Magnesium: 2 mg/dL (ref 1.7–2.4)

## 2020-08-16 MED ORDER — CLOPIDOGREL BISULFATE 75 MG PO TABS
75.0000 mg | ORAL_TABLET | Freq: Every day | ORAL | Status: DC
  Administered 2020-08-16 – 2020-08-18 (×3): 75 mg via ORAL
  Filled 2020-08-16 (×3): qty 1

## 2020-08-16 NOTE — Evaluation (Signed)
SLP Cancellation Note  Patient Details Name: Tanya [name] MRN: [id number] DOB: Jan 12, 1925   Cancelled treatment:       Reason Eval/Treat Not Completed: Other (comment) (per notes, pt's dysarthria resolved and imaging was negative, recommendation from PT/OT is SNF- rec follow up at SNF if indicated.  Thanks.  Kathleen Lime, MS [facility] SLP Acute Rehab Services Office [phone] Pager [phone]    Macario Golds 08/16/2020, 7:11 PM

## 2020-08-16 NOTE — Evaluation (Signed)
Occupational Therapy Evaluation Patient Details Name: Tanya [name] MRN: [id number] DOB: [birth year] Today's Date: 08/16/2020    History of Present Illness 87 year old female with PMH TIA, vitamin B12 deficiency, mild cognitive impairment who presented to the ER after mechanical fall at home. X-rays showed  intertrochanteric right femur fracture with slight varus angulation.  She was transferred to [facility] for orthopedic surgery evaluation. pt is now s/p IM nail RLE. Patient with R facial droop 8/9, code stroke initiated. MRI was negative for acute findings   Clinical Impression   Patient typically lives home alone and ambulates with cane as needed, independent with basic ADL tasks. Post surgery patient presenting with increased weakness, decreased balance, endurance and safety awareness needing mod verbal cues for sequencing with walker + for safe hand placement. Patient also needing mod to max A for LB ADLs including donning mesh underwear due to hip pain and limited endurance. Acute OT agree with SNF rehab recommendation and will continue to follow acutely.     Follow Up Recommendations  SNF    Equipment Recommendations  Other (comment) (defer to next venue)       Precautions / Restrictions Precautions Precautions: Fall Restrictions Weight Bearing Restrictions: Yes RLE Weight Bearing: Weight bearing as tolerated      Mobility Bed Mobility Overal bed mobility: Needs Assistance Bed Mobility: Supine to Sit     Supine to sit: Mod assist;HOB elevated     General bed mobility comments: assist with R LE and elevate trunk upright    Transfers Overall transfer level: Needs assistance Equipment used: Rolling walker (2 wheeled) Transfers: Sit to/from [facility] Sit to Stand: Min assist Stand pivot transfers: Min assist       General transfer comment: see toilet transfer in ADL section    Balance Overall balance assessment: Needs assistance;History of  Falls Sitting-balance support: Feet supported Sitting balance-Leahy Scale: Fair     Standing balance support: Single extremity supported;Bilateral upper extremity supported Standing balance-Leahy Scale: Poor Standing balance comment: reliant on UEs and external support                           ADL either performed or assessed with clinical judgement   ADL Overall ADL's : Needs assistance/impaired Eating/Feeding: Independent;Sitting Eating/Feeding Details (indicate cue type and reason): drink water Grooming: Set up;Sitting   Upper Body Bathing: Set up;Sitting   Lower Body Bathing: Moderate assistance;Sitting/lateral leans;Sit to/from stand   Upper Body Dressing : Set up;Sitting   Lower Body Dressing: Maximal assistance;Sitting/lateral leans;Sit to/from stand Lower Body Dressing Details (indicate cue type and reason): patient needing assist to thread R LE through mesh underwear and to pull up over hips Toilet Transfer: Minimal assistance;Cueing for safety;Cueing for sequencing;Stand-pivot;RW;BSC Toilet Transfer Details (indicate cue type and reason): first to bedside commode then recliner. needs cues for safe hand placement not to pull on walker. min A to power up to standing Toileting- Clothing Manipulation and Hygiene: Minimal assistance;Sit to/from stand;Sitting/lateral lean Toileting - Clothing Manipulation Details (indicate cue type and reason): min A for standing balance, patient able to perform peri care after soiled in bed with urine     Functional mobility during ADLs: Minimal assistance;Rolling walker;Cueing for sequencing;Cueing for safety        Pertinent Vitals/Pain Pain Assessment: Faces Faces Pain Scale: Hurts a little bit Pain Location: R hip Pain Descriptors / Indicators: Grimacing;Sore Pain Intervention(s): Monitored during session     Hand Dominance  (did  not specify)   Extremity/Trunk Assessment Upper Extremity Assessment Upper Extremity  Assessment: Generalized weakness   Lower Extremity Assessment Lower Extremity Assessment: Defer to PT evaluation       Communication Communication Communication: No difficulties   Cognition Arousal/Alertness: Awake/alert Behavior During Therapy: WFL for tasks assessed/performed Overall Cognitive Status: History of cognitive impairments - at baseline                                 General Comments: "mild cognitive impairment" per hx;   General Comments  patient O2 reading 94-95% on room air            Home Living Family/patient expects to be discharged to:: Skilled nursing facility Living Arrangements: Alone Available Help at Discharge: Family Type of Home: House Home Access: Stairs to enter [employer] of Steps: 1   Home Layout: One level               Home Equipment: Environmental consultant - 2 wheels;Cane - single point          Prior Functioning/Environment Level of Independence: Independent        Comments: amb with cane most of the time        OT Problem List: Decreased strength;Decreased activity tolerance;Impaired balance (sitting and/or standing);Decreased safety awareness;Pain      OT Treatment/Interventions: Self-care/ADL training;DME and/or AE instruction;Therapeutic activities;Patient/family education;Balance training;Cognitive remediation/compensation;Therapeutic exercise    OT Goals(Current goals can be found in the care plan section) Acute Rehab OT Goals Patient Stated Goal: get better OT Goal Formulation: With patient Time For Goal Achievement: 08/30/20 Potential to Achieve Goals: Good  OT Frequency: Min 2X/week    AM-PAC OT "6 Clicks" Daily Activity     Outcome Measure Help from another person eating meals?: None Help from another person taking care of personal grooming?: A Little Help from another person toileting, which includes using toliet, bedpan, or urinal?: A Little Help from another person bathing (including  washing, rinsing, drying)?: A Lot Help from another person to put on and taking off regular upper body clothing?: A Little Help from another person to put on and taking off regular lower body clothing?: A Lot 6 Click Score: 17   End of Session Equipment Utilized During Treatment: Rolling walker Nurse Communication: Mobility status  Activity Tolerance: Patient tolerated treatment well Patient left: in chair;with call bell/phone within reach;with chair alarm set;with family/visitor present  OT Visit Diagnosis: Unsteadiness on feet (R26.81);Other abnormalities of gait and mobility (R26.89);Muscle weakness (generalized) (M62.81);History of falling (Z91.81);Pain Pain - Right/Left: Right Pain - part of body: Hip                Time: [id number] OT Time Calculation (min): 32 min Charges:  OT General Charges $OT Visit: 1 Visit OT Evaluation $OT Eval Low Complexity: 1 Low OT Treatments $Self Care/Home Management : 8-22 mins  Delbert Phenix OT OT pager: [place] 08/16/2020, 12:00 PM

## 2020-08-16 NOTE — Progress Notes (Signed)
PROGRESS NOTE    Tanya [name]  [id number] DOB: [birth year] DOA: 08/12/2020 PCP: [name], MD   Brief Narrative: Tanya [name] is a 87 year old female with PMH TIA, vitamin B12 deficiency, mild cognitive impairment who presented to the ER after mechanical fall at home. She states that she was watching TV then upon standing up she saw a bug and lifted her right leg to step on it when she lost her footing and fell onto her right side.  She began experiencing significant right-sided hip pain after her fall. X-rays on work-up in the ER showed an intertrochanteric right femur fracture with slight varus angulation. She was transferred to [facility] for orthopedic surgery evaluation and probable surgical repair. Typically she is very mobile and ambulates well at home.  She can climb stairs with no difficulty nor any associated chest pain or shortness of breath. No known cardiac conditions.  Had left shoulder surgery several years ago but no known issues with anesthesia at that time either. She underwent IM nail fixation on 08/13/20 with orthopedic surgery.   Patient had been recovering as expected until 08/15/2020 when she developed acute onset of mild right facial droop and dysarthria.  A code stroke was initiated and she was evaluated by tele neurology.  Assessment & Plan:   Principal Problem:   Intertrochanteric fracture of right femur (HCC) Active Problems:   Hypothyroidism   HTN (hypertension)   B12 deficiency   History of TIA (transient ischemic attack)   Acute respiratory failure with hypoxia (HCC)   Hip fracture (HCC)   Dysarthria  #1 intertrochanteric fracture of the right femur status post mechanical fall.  Status post surgery on aspirin 325 mg daily.  PT is recommending skilled nursing facility for rehab.  #2  Dysarthria patient developed right facial droop and dysarthria on 08/15/2020.  This has resolved.  CT of the head does not show any acute infarct.  MRI of the brain does not  show any acute infarct.  CT angiogram of the head and neck does show some blockages in the vertebral and carotid vessels.  Discussed with neurology we will continue aspirin and start Plavix.  #3 hypothyroidism Synthroid dose has been increased to 125 MCG during this admission.  Rec repeat TSH in 6 weeks.  Her TSH this admission was 24.6.  #4 essential hypertension continue amlodipine  #5 disposition to SNF discussed with Dr. Amalia Hailey  Estimated body mass index is 25.2 kg/m as calculated from the following:   Height as of this encounter: '5\' 2"'$  (1.575 m).   Weight as of this encounter: 62.5 kg.  DVT prophylaxis: Aspirin Family Communication: Discussed with daughter-in-law at bedside  disposition Plan:  Status is: Inpatient  Remains inpatient appropriate because:Inpatient level of care appropriate due to severity of illness  Dispo: The patient is from: Home              Anticipated d/c is to: SNF              Patient currently is medically stable to d/c.   Difficult to place patient No       Consultants:  Ortho and neuro  Procedures: Hip surgery right intramedullary nail  Antimicrobials:none  Subjective: He is resting in bed she is awake alert her speech is back to baseline her daughter-in-law is in the room Echo results are pending  Objective: Vitals:   08/16/20 0000 08/16/20 0200 08/16/20 1006 08/16/20 1220  BP: (!) 139/56 (!) 153/65 140/67 136/62  Pulse: 82  84 84 91  Resp: '14 13 19 15  '$ Temp: 98.3 F (36.8 C) 98.4 F (36.9 C) 98.4 F (36.9 C) 98.3 F (36.8 C)  TempSrc: Oral Oral Oral Oral  SpO2: 95% 93% 97% 97%  Weight:      Height:        Intake/Output Summary (Last 24 hours) at 08/16/2020 1432 Last data filed at 08/15/2020 2100 Gross per 24 hour  Intake 10 ml  Output 600 ml  Net -590 ml   Filed Weights   08/12/20 1055 08/12/20 1500  Weight: 64 kg 62.5 kg    Examination:  General exam: Appears calm and comfortable  Respiratory system: Clear to  auscultation. Respiratory effort normal. Cardiovascular system: S1 & S2 heard, RRR. No JVD, murmurs, rubs, gallops or clicks. No pedal edema. Gastrointestinal system: Abdomen is nondistended, soft and nontender. No organomegaly or masses felt. Normal bowel sounds heard. Central nervous system: Alert and oriented. No focal neurological deficits. Extremities: Symmetric 5 x 5 power. Skin: No rashes, lesions or ulcers Psychiatry: Judgement and insight appear normal. Mood & affect appropriate.     Data Reviewed: I have personally reviewed following labs and imaging studies  CBC: Recent Labs  Lab 08/12/20 1151 08/13/20 0230 08/14/20 0426 08/15/20 0331 08/16/20 0358  WBC 4.9 6.1 7.5 4.1 3.8*  NEUTROABS 3.1 4.2 4.6 1.6* 1.2*  HGB 12.1 11.5* 8.6* 8.1* 8.1*  HCT 37.2 35.0* 27.1* 25.0* 25.1*  MCV 86.5 85.4 88.0 87.7 88.1  PLT 356 310 240 222 [id number]   Basic Metabolic Panel: Recent Labs  Lab 08/12/20 1151 08/13/20 0230 08/14/20 0321 08/15/20 0331 08/16/20 0358  NA 132* 128* 130* 131* 135  K 3.9 3.8 4.0 3.9 3.6  CL 95* 95* 98 97* 101  CO2 '27 25 26 28 '$ 32  GLUCOSE 101* 128* 94 94 90  BUN '11 14 14 13 8  '$ CREATININE 0.65 0.57 0.67 0.58 0.49  CALCIUM 9.1 8.8* 8.3* 8.1* 8.3*  MG  --  2.0 1.8 1.9 2.0   GFR: Estimated Creatinine Clearance: 39 mL/min (by C-G formula based on SCr of 0.49 mg/dL). Liver Function Tests: No results for input(s): AST, ALT, ALKPHOS, BILITOT, PROT, ALBUMIN in the last 168 hours. No results for input(s): LIPASE, AMYLASE in the last 168 hours. No results for input(s): AMMONIA in the last 168 hours. Coagulation Profile: Recent Labs  Lab 08/12/20 1151  INR 1.0   Cardiac Enzymes: No results for input(s): CKTOTAL, CKMB, CKMBINDEX, TROPONINI in the last 168 hours. BNP (last 3 results) No results for input(s): PROBNP in the last 8760 hours. HbA1C: Recent Labs    08/15/20 0331  HGBA1C 5.8*   CBG: Recent Labs  Lab 08/15/20 1110  GLUCAP 113*   Lipid  Profile: Recent Labs    08/15/20 0331  CHOL 132  HDL 50  LDLCALC 69  TRIG 64  CHOLHDL 2.6   Thyroid Function Tests: No results for input(s): TSH, T4TOTAL, FREET4, T3FREE, THYROIDAB in the last 72 hours. Anemia Panel: No results for input(s): VITAMINB12, FOLATE, FERRITIN, TIBC, IRON, RETICCTPCT in the last 72 hours. Sepsis Labs: No results for input(s): PROCALCITON, LATICACIDVEN in the last 168 hours.  Recent Results (from the past 240 hour(s))  Resp Panel by RT-PCR (Flu A&B, Covid) Nasopharyngeal Swab     Status: None   Collection Time: 08/12/20 12:11 PM   Specimen: Nasopharyngeal Swab; Nasopharyngeal(NP) swabs in vial transport medium  Result Value Ref Range Status   SARS Coronavirus 2 by RT PCR NEGATIVE NEGATIVE Final  Comment: (NOTE) SARS-CoV-2 target nucleic acids are NOT DETECTED.  The SARS-CoV-2 RNA is generally detectable in upper respiratory specimens during the acute phase of infection. The lowest concentration of SARS-CoV-2 viral copies this assay can detect is 138 copies/mL. A negative result does not preclude SARS-Cov-2 infection and should not be used as the sole basis for treatment or other patient management decisions. A negative result may occur with  improper specimen collection/handling, submission of specimen other than nasopharyngeal swab, presence of viral mutation(s) within the areas targeted by this assay, and inadequate number of viral copies(<138 copies/mL). A negative result must be combined with clinical observations, patient history, and epidemiological information. The expected result is Negative.  Fact Sheet for Patients:  EntrepreneurPulse.com.au  Fact Sheet for Healthcare Providers:  IncredibleEmployment.be  This test is no t yet approved or cleared by the [place] FDA and  has been authorized for detection and/or diagnosis of SARS-CoV-2 by FDA under an Emergency Use Authorization (EUA). This EUA  will remain  in effect (meaning this test can be used) for the duration of the COVID-19 declaration under Section 564(b)(1) of the Act, 21 U.S.C.section 360bbb-3(b)(1), unless the authorization is terminated  or revoked sooner.       Influenza A by PCR NEGATIVE NEGATIVE Final   Influenza B by PCR NEGATIVE NEGATIVE Final    Comment: (NOTE) The Xpert Xpress SARS-CoV-2/FLU/RSV plus assay is intended as an aid in the diagnosis of influenza from Nasopharyngeal swab specimens and should not be used as a sole basis for treatment. Nasal washings and aspirates are unacceptable for Xpert Xpress SARS-CoV-2/FLU/RSV testing.  Fact Sheet for Patients: EntrepreneurPulse.com.au  Fact Sheet for Healthcare Providers: IncredibleEmployment.be  This test is not yet approved or cleared by the [place] FDA and has been authorized for detection and/or diagnosis of SARS-CoV-2 by FDA under an Emergency Use Authorization (EUA). This EUA will remain in effect (meaning this test can be used) for the duration of the COVID-19 declaration under Section 564(b)(1) of the Act, 21 U.S.C. section 360bbb-3(b)(1), unless the authorization is terminated or revoked.  Performed at [facility], [address] [place], [place] [address]   Surgical pcr screen     Status: None   Collection Time: 08/12/20  5:32 PM   Specimen: Nasal Mucosa; Nasal Swab  Result Value Ref Range Status   MRSA, PCR NEGATIVE NEGATIVE Final   Staphylococcus aureus NEGATIVE NEGATIVE Final    Comment: (NOTE) The Xpert SA Assay (FDA approved for NASAL specimens in patients 41 years of age and older), is one component of a comprehensive surveillance program. It is not intended to diagnose infection nor to guide or monitor treatment. Performed at [facility], [place] [address] [place], [place] [address]          Radiology Studies: CT ANGIO HEAD W OR WO  CONTRAST  Result Date: 08/15/2020 CLINICAL DATA:  Neuro deficit, acute, stroke suspected facial droop, slurred speech; Neuro deficit, acute, stroke suspected Facial droop, slurred speech EXAM: CT ANGIOGRAPHY HEAD AND NECK TECHNIQUE: Multidetector CT imaging of the head and neck was performed using the standard protocol during bolus administration of intravenous contrast. Multiplanar CT image reconstructions and MIPs were obtained to evaluate the vascular anatomy. Carotid stenosis measurements (when applicable) are obtained utilizing NASCET criteria, using the distal internal carotid diameter as the denominator. CONTRAST:  29m OMNIPAQUE IOHEXOL 350 MG/ML SOLN COMPARISON:  None. FINDINGS: CTA NECK FINDINGS Aortic arch: Atherosclerosis of the aorta and the great vessels. [employer]  vessel origins are patent. Right carotid system: Patent. No significant (greater than 50%) stenosis. Moderate calcific and noncalcific atherosclerosis at the carotid bifurcation. Tortuous common and internal carotid arteries. Left carotid system: Patent. Moderate predominantly calcific atherosclerosis at the carotid bifurcation without greater than 50% stenosis. Tortuous common and internal carotid arteries. Vertebral arteries: Patent. Moderate stenosis of the left vertebral artery origin. Mildly left dominant. Tortuous bilaterally. Skeleton: Severe degenerative disc disease at C6-C7 where there is marked disc height loss, endplate sclerosis and posterior disc osteophyte complex. Other neck: No acute abnormality. Upper chest: Motion limited evaluation without consolidation in the visualized lung apices. Review of the MIP images confirms the above findings CTA HEAD FINDINGS Anterior circulation: Moderate calcific atherosclerosis of bilateral intracranial ICAs. Moderate right and mild left paraclinoid ICA stenosis. Bilateral M1 and proximal M2 MCA branches are patent without proximal hemodynamically significant stenosis. Bilateral ACAs are  patent. 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 Posterior circulation: Bilateral intradural vertebral arteries, basilar artery, and posterior cerebral arteries are patent without proximal hemodynamically significant stenosis. No aneurysm identified. Venous sinuses: As permitted by contrast timing, patent. Review of the MIP images confirms the above findings IMPRESSION: CTA head: 1. No large vessel occlusion. 2. Moderate right and mild left paraclinoid ICA stenosis. 3. 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 CTA neck: 1. Moderate stenosis of the left vertebral artery origin. 2. Moderate bilateral carotid bifurcation atherosclerosis without greater than 50% stenosis. Electronically Signed   By: Margaretha Sheffield MD   On: 08/15/2020 19:49   CT ANGIO NECK W OR WO CONTRAST  Result Date: 08/15/2020 CLINICAL DATA:  Neuro deficit, acute, stroke suspected facial droop, slurred speech; Neuro deficit, acute, stroke suspected Facial droop, slurred speech EXAM: CT ANGIOGRAPHY HEAD AND NECK TECHNIQUE: Multidetector CT imaging of the head and neck was performed using the standard protocol during bolus administration of intravenous contrast. Multiplanar CT image reconstructions and MIPs were obtained to evaluate the vascular anatomy. Carotid stenosis measurements (when applicable) are obtained utilizing NASCET criteria, using the distal internal carotid diameter as the denominator. CONTRAST:  97m OMNIPAQUE IOHEXOL 350 MG/ML SOLN COMPARISON:  None. FINDINGS: CTA NECK FINDINGS Aortic arch: Atherosclerosis of the aorta and the great vessels. Great vessel origins are patent. Right carotid system: Patent. No significant (greater than 50%) stenosis. Moderate calcific and noncalcific atherosclerosis at the carotid bifurcation. Tortuous common and internal carotid arteries. Left carotid system: Patent. Moderate predominantly calcific atherosclerosis at the carotid bifurcation without  greater than 50% stenosis. Tortuous common and internal carotid arteries. Vertebral arteries: Patent. Moderate stenosis of the left vertebral artery origin. Mildly left dominant. Tortuous bilaterally. Skeleton: Severe degenerative disc disease at C6-C7 where there is marked disc height loss, endplate sclerosis and posterior disc osteophyte complex. Other neck: No acute abnormality. Upper chest: Motion limited evaluation without consolidation in the visualized lung apices. Review of the MIP images confirms the above findings CTA HEAD FINDINGS Anterior circulation: Moderate calcific atherosclerosis of bilateral intracranial ICAs. Moderate right and mild left paraclinoid ICA stenosis. Bilateral M1 and proximal M2 MCA branches are patent without proximal hemodynamically significant stenosis. Bilateral ACAs are patent. 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 Posterior circulation: Bilateral intradural vertebral arteries, basilar artery, and posterior cerebral arteries are patent without proximal hemodynamically significant stenosis. No aneurysm identified. Venous sinuses: As permitted by contrast timing, patent. Review of the MIP images confirms the above findings IMPRESSION: CTA head: 1. No large vessel occlusion. 2. Moderate right and  mild left paraclinoid ICA stenosis. 3. 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 CTA neck: 1. Moderate stenosis of the left vertebral artery origin. 2. Moderate bilateral carotid bifurcation atherosclerosis without greater than 50% stenosis. Electronically Signed   By: Margaretha Sheffield MD   On: 08/15/2020 19:49   MR BRAIN WO CONTRAST  Result Date: 08/16/2020 CLINICAL DATA:  Neuro deficit, acute, stroke suspected; facial droop, slurred speech. Additional history provided: Status post hip surgery August 7th, developed acute onset mild right facial droop and dysarthria 08/15/2020. Evaluate for possible CVA. EXAM: MRI HEAD WITHOUT CONTRAST  TECHNIQUE: Multiplanar, multiecho pulse sequences of the brain and surrounding structures were obtained without intravenous contrast. COMPARISON:  Noncontrast head CT and CT angiogram head/neck 08/15/2020. MRI brain 07/02/2019. FINDINGS: Brain: Generalized cerebral and cerebellar atrophy. As before, there is lateral and third ventriculomegaly which appears out proportion to the degree of generalized cerebral volume loss. 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 Advanced patchy and confluent T2/FLAIR hyperintensity within the cerebral white matter, nonspecific but compatible with chronic small vessel ischemic disease. There is no acute infarct. No evidence of an intracranial mass. No chronic intracranial blood products. No extra-axial fluid collection. No midline shift. Vascular: Maintained proximal large arterial flow voids. Skull and upper cervical spine: Focal suspicious marrow lesion. Sinuses/Orbits: Visualized orbits show no acute finding. Leftward gaze no significant paranasal sinus disease. IMPRESSION: No evidence of acute intracranial abnormality. As before, there is lateral and third ventriculomegaly which appears out of proportion to the degree of generalized cerebral volume loss. These findings suggest normal pressure hydrocephalus. Advanced chronic small vessel ischemic changes within the cerebral white matter. Electronically Signed   By: Kellie Simmering DO   On: 08/16/2020 07:53   CT HEAD CODE STROKE WO CONTRAST  Addendum Date: 08/15/2020   ADDENDUM REPORT: 08/15/2020 12:02 ADDENDUM: Following multiple attempts to reach the ordering provider, these results were discussed by telephone on 08/15/2020 at 11:58 pm with provider DAVID Sabino Gasser , who verbally acknowledged these results. Electronically Signed   By: Kellie Simmering DO   On: 08/15/2020 12:02   Result Date: 08/15/2020 CLINICAL DATA:  Code stroke. Neuro deficit, acute, stroke suspected. Additional provided: Fall a few days ago,  right hip surgery, right facial droop. EXAM: CT HEAD WITHOUT CONTRAST TECHNIQUE: Contiguous axial images were obtained from the base of the skull through the vertex without intravenous contrast. COMPARISON:  Brain MRI 07/02/2019. MRA head 06/29/2019. Head CT 05/20/2019. FINDINGS: Brain: As before, there is lateral and third ventriculomegaly which is out of proportion to the degree of generalized cerebral atrophy, and suggestive of normal pressure hydrocephalus. Moderate/advanced patchy and confluent hypoattenuation within the cerebral white matter, nonspecific but compatible with chronic small vessel ischemic disease. There is no acute intracranial hemorrhage. No demarcated cortical infarct. No extra-axial fluid collection. No evidence of an intracranial mass. No midline shift. Vascular: No hyperdense vessel.  Atherosclerotic calcifications. Skull: Normal. Negative for fracture or focal lesion. Sinuses/Orbits: Visualized orbits show no acute finding. No significant paranasal sinus disease. Other: Prominent torus palatinus. ASPECTS ([place] Stroke Program Early CT Score) - Ganglionic level infarction (caudate, lentiform nuclei, internal capsule, insula, M1-M3 cortex): 7 - Supraganglionic infarction (M4-M6 cortex): 3 Total score (0-10 with 10 being normal): 10 IMPRESSION: No acute intracranial hemorrhage or acute demarcated cortical infarction. ASPECTS is 10. As before, there is lateral and third ventriculomegaly which is out of proportion to the degree of generalized cerebral atrophy. 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 Moderate/advanced chronic small  vessel ischemic changes within the cerebral white matter. Electronically Signed: By: Kellie Simmering DO On: 08/15/2020 11:38        Scheduled Meds:  amLODipine  2.5 mg Oral QPM   aspirin EC  325 mg Oral Q breakfast   docusate sodium  100 mg Oral BID   levothyroxine  125 mcg Oral QAC breakfast   sodium chloride flush  3 mL Intravenous  Q12H   vitamin B-12  100 mcg Oral Daily   Continuous Infusions:  methocarbamol (ROBAXIN) IV 500 mg (08/13/20 1014)     LOS: 3 days    Time spent: 45 minutes  Georgette Shell, MD  08/16/2020, 2:32 PM

## 2020-08-16 NOTE — Progress Notes (Signed)
Physical Therapy Treatment Patient Details Name: Tanya [name] MRN: [id number] DOB: [birth year] Today's Date: 08/16/2020    History of Present Illness 87 year old female with PMH TIA, vitamin B12 deficiency, mild cognitive impairment who presented to the ER after mechanical fall at home. X-rays showed  intertrochanteric right femur fracture with slight varus angulation.  She was transferred to [facility] for orthopedic surgery evaluation. pt is now s/p IM nail RLE. Patient with R facial droop 8/9, code stroke initiated. MRI was negative for acute findings    PT Comments    Pt assisted with ambulating in hallway and currently requiring min assist for mobility.  Pt denies any symptoms today other than anticipated right hip pain.    General Gait Details: verbal cues for sequence, RW positioning, step length, posture, Spo2 94-97% on room air during session (reapplied 2L O2 [place] upon return to recliner); pt denies dizziness Continue to recommend SNF upon d/c.    Follow Up Recommendations  SNF     Equipment Recommendations  None recommended by PT    Recommendations for Other Services       Precautions / Restrictions Precautions Precautions: Fall Precaution Comments: HOH, use right ear Restrictions Weight Bearing Restrictions: Yes RLE Weight Bearing: Weight bearing as tolerated    Mobility  Bed Mobility Overal bed mobility: Needs Assistance Bed Mobility: Supine to Sit     Supine to sit: Mod assist;HOB elevated     General bed mobility comments: pt in recliner on arrival    Transfers Overall transfer level: Needs assistance Equipment used: Rolling walker (2 wheeled) Transfers: Sit to/from Stand Sit to Stand: Min assist Stand pivot transfers: Min assist       General transfer comment: verbal cues for UE and LE positioning; assist to rise and steady as well as control descent  Ambulation/Gait Ambulation/Gait assistance: Min guard;Min assist Gait Distance (Feet): 34  Feet Assistive device: Rolling walker (2 wheeled) Gait Pattern/deviations: Step-to pattern;Decreased stance time - right;Antalgic Gait velocity: decr   General Gait Details: verbal cues for sequence, RW positioning, step length, posture, Spo2 94-97% on room air during session (reapplied 2L O2 [place] upon return to recliner); pt denies dizziness   Stairs             Wheelchair Mobility    Modified Rankin (Stroke Patients Only)       Balance Overall balance assessment: Needs assistance;History of Falls Sitting-balance support: Feet supported Sitting balance-Leahy Scale: Fair     Standing balance support: Single extremity supported;Bilateral upper extremity supported Standing balance-Leahy Scale: Poor Standing balance comment: reliant on UEs and external support                            Cognition Arousal/Alertness: Awake/alert Behavior During Therapy: WFL for tasks assessed/performed Overall Cognitive Status: History of cognitive impairments - at baseline                                 General Comments: "mild cognitive impairment" per hx      Exercises Total Joint Exercises Ankle Circles/Pumps: AROM;Both;10 reps;Supine Quad Sets: AROM;Right;10 reps;Supine Long Arc Quad: AAROM;Right;10 reps;Seated    General Comments General comments (skin integrity, edema, etc.): patient O2 reading 94-95% on room air      Pertinent Vitals/Pain Pain Assessment: Faces Faces Pain Scale: Hurts a little bit Pain Location: R hip Pain Descriptors / Indicators: Grimacing;Sore Pain Intervention(s):  Repositioned;Monitored during session    [place] expects to be discharged to:: Skilled nursing facility Living Arrangements: Alone Available Help at Discharge: Family Type of Home: House Home Access: Stairs to enter   Home Layout: One level Home Equipment: Environmental consultant - 2 wheels;Cane - single point      Prior Function Level of Independence:  Independent      Comments: amb with cane most of the time   PT Goals (current goals can now be found in the care plan section) Acute Rehab PT Goals Patient Stated Goal: get better Progress towards PT goals: Progressing toward goals    Frequency    Min 3X/week      PT Plan Current plan remains appropriate    Co-evaluation              AM-PAC PT "6 Clicks" Mobility   Outcome Measure  Help needed turning from your back to your side while in a flat bed without using bedrails?: A Little Help needed moving from lying on your back to sitting on the side of a flat bed without using bedrails?: A Little Help needed moving to and from a bed to a chair (including a wheelchair)?: A Little Help needed standing up from a chair using your arms (e.g., wheelchair or bedside chair)?: A Little Help needed to walk in hospital room?: A Lot Help needed climbing 3-5 steps with a railing? : A Lot 6 Click Score: 16    End of Session Equipment Utilized During Treatment: Gait belt Activity Tolerance: Patient tolerated treatment well Patient left: in chair;with call bell/phone within reach;with chair alarm set   PT Visit Diagnosis: Other abnormalities of gait and mobility (R26.89);Difficulty in walking, not elsewhere classified (R26.2)     Time: [id number] PT Time Calculation (min) (ACUTE ONLY): 21 min  Charges:  $Gait Training: 8-22 mins                    Jannette Spanner PT, DPT Acute Rehabilitation Services Pager: [phone] Office: [phone]   Trena Platt 08/16/2020, 12:49 PM

## 2020-08-17 ENCOUNTER — Inpatient Hospital Stay (HOSPITAL_COMMUNITY): Payer: PPO

## 2020-08-17 DIAGNOSIS — I6389 Other cerebral infarction: Secondary | ICD-10-CM

## 2020-08-17 LAB — ECHOCARDIOGRAM COMPLETE
AR max vel: 1.93 cm2
AV Area VTI: 2 cm2
AV Area mean vel: 1.85 cm2
AV Mean grad: 5 mmHg
AV Peak grad: 9 mmHg
AV Vena cont: 0.2 cm
Ao pk vel: 1.5 m/s
Area-P 1/2: 4.17 cm2
Calc EF: 44.5 %
Height: 62 in
S' Lateral: 2 cm
Single Plane A2C EF: 27.9 %
Single Plane A4C EF: 56.3 %
Weight: 2204.6 oz

## 2020-08-17 LAB — CBC WITH DIFFERENTIAL/PLATELET
Abs Immature Granulocytes: 0.02 10*3/uL (ref 0.00–0.07)
Basophils Absolute: 0.1 10*3/uL (ref 0.0–0.1)
Basophils Relative: 1 %
Eosinophils Absolute: 0.3 10*3/uL (ref 0.0–0.5)
Eosinophils Relative: 6 %
HCT: 25.4 % — ABNORMAL LOW (ref 36.0–46.0)
Hemoglobin: 8 g/dL — ABNORMAL LOW (ref 12.0–15.0)
Immature Granulocytes: 1 %
Lymphocytes Relative: 31 %
Lymphs Abs: 1.4 10*3/uL (ref 0.7–4.0)
MCH: 28 pg (ref 26.0–34.0)
MCHC: 31.5 g/dL (ref 30.0–36.0)
MCV: 88.8 fL (ref 80.0–100.0)
Monocytes Absolute: 1 10*3/uL (ref 0.1–1.0)
Monocytes Relative: 23 %
Neutro Abs: 1.7 10*3/uL (ref 1.7–7.7)
Neutrophils Relative %: 38 %
Platelets: 301 10*3/uL (ref 150–400)
RBC: 2.86 MIL/uL — ABNORMAL LOW (ref 3.87–5.11)
RDW: 15.1 % (ref 11.5–15.5)
WBC: 4.4 10*3/uL (ref 4.0–10.5)
nRBC: 0 % (ref 0.0–0.2)

## 2020-08-17 LAB — BASIC METABOLIC PANEL
Anion gap: 6 (ref 5–15)
BUN: 12 mg/dL (ref 8–23)
CO2: 28 mmol/L (ref 22–32)
Calcium: 8.3 mg/dL — ABNORMAL LOW (ref 8.9–10.3)
Chloride: 100 mmol/L (ref 98–111)
Creatinine, Ser: 0.53 mg/dL (ref 0.44–1.00)
GFR, Estimated: >60 mL/min (ref >60)
Glucose, Bld: 99 mg/dL (ref 70–99)
Potassium: 3.7 mmol/L (ref 3.5–5.1)
Sodium: 134 mmol/L — ABNORMAL LOW (ref 135–145)

## 2020-08-17 LAB — MAGNESIUM: Magnesium: 2.1 mg/dL (ref 1.7–2.4)

## 2020-08-17 LAB — RESP PANEL BY RT-PCR (FLU A&B, COVID) ARPGX2
Influenza A by PCR: NEGATIVE
Influenza B by PCR: NEGATIVE
SARS Coronavirus 2 by RT PCR: NEGATIVE

## 2020-08-17 MED ORDER — BISACODYL 10 MG RE SUPP
10.0000 mg | Freq: Once | RECTAL | Status: AC
  Administered 2020-08-17: 10 mg via RECTAL
  Filled 2020-08-17: qty 1

## 2020-08-17 MED ORDER — DOCUSATE SODIUM 100 MG PO CAPS: 100.0000 mg | ORAL_CAPSULE | Freq: Two times a day (BID) | ORAL | 0 refills | Status: DC

## 2020-08-17 MED ORDER — LEVOTHYROXINE SODIUM 125 MCG PO TABS: 125.0000 ug | ORAL_TABLET | Freq: Every day | ORAL | 2 refills | Status: DC

## 2020-08-17 MED ORDER — MORPHINE SULFATE (PF) 2 MG/ML IV SOLN
0.5000 mg | Freq: Four times a day (QID) | INTRAVENOUS | Status: DC | PRN
  Administered 2020-08-17: 0.5 mg via INTRAVENOUS
  Filled 2020-08-17: qty 1

## 2020-08-17 MED ORDER — SORBITOL 70 % SOLN
200.0000 mL | TOPICAL_OIL | Freq: Once | ORAL | Status: AC
  Administered 2020-08-17: 200 mL via RECTAL
  Filled 2020-08-17: qty 60

## 2020-08-17 MED ORDER — CLOPIDOGREL BISULFATE 75 MG PO TABS: 75.0000 mg | ORAL_TABLET | Freq: Every day | ORAL | 2 refills | Status: DC

## 2020-08-17 NOTE — Progress Notes (Signed)
*  PRELIMINARY RESULTS* Echocardiogram 2D Echocardiogram has been performed.  Luisa Hart RDCS 08/17/2020, 1:45 PM

## 2020-08-17 NOTE — Discharge Summary (Addendum)
Physician Discharge Summary  Tanya [name] [id number] DOB: 1925/05/30 DOA: 08/12/2020  PCP: [name], MD  Admit date: 08/12/2020 Discharge date: 08/18/20 Admitted From: Home Disposition: Skilled nursing facility  Recommendations for Outpatient Follow-up:  Follow up with PCP in 1-2 weeks Please obtain BMP/CBC in one week Please follow up with Ortho Home Health: None Equipment/Devices: None Discharge Condition stable CODE STATUS: Full code Diet recommendation: Cardiac diet Brief/Interim Summary:Tanya [name] is a 87 year old female with PMH TIA, vitamin B12 deficiency, mild cognitive impairment who presented to the ER after mechanical fall at home. She states that she was watching TV then upon standing up she saw a bug and lifted her right leg to step on it when she lost her footing and fell onto her right side.  She began experiencing significant right-sided hip pain after her fall. X-rays on work-up in the ER showed an intertrochanteric right femur fracture with slight varus angulation. She was transferred to [facility] for orthopedic surgery evaluation and probable surgical repair. Typically she is very mobile and ambulates well at home.  She can climb stairs with no difficulty nor any associated chest pain or shortness of breath. No known cardiac conditions.  Had left shoulder surgery several years ago but no known issues with anesthesia at that time either. She underwent IM nail fixation on 08/13/20 with orthopedic surgery.   Patient had been recovering as expected until 08/15/2020 when she developed acute onset of mild right facial droop and dysarthria.  A code stroke was initiated and she was evaluated by tele neurology.   Discharge Diagnoses:  Principal Problem:   Intertrochanteric fracture of right femur (HCC) Active Problems:   Hypothyroidism   HTN (hypertension)   B12 deficiency   History of TIA (transient ischemic attack)   Acute respiratory failure with hypoxia (HCC)   Hip  fracture (HCC)   Dysarthria  #1 intertrochanteric fracture of the right femur status post mechanical fall.  Status post surgery on aspirin 325 mg daily.  PT is recommending skilled nursing facility for rehab.  #2  Dysarthria patient developed right facial droop and dysarthria on 08/15/2020.  This has resolved.  CT of the head does not show any acute infarct.  MRI of the brain does not show any acute infarct.  CT angiogram of the head and neck does show some blockages in the vertebral and carotid vessels.  Discussed with neurology we will continue aspirin and start Plavix.   #3 hypothyroidism Synthroid dose has been increased to 125 MCG during this admission.  Rec repeat TSH in 6 weeks.  Her TSH this admission was 24.6.   #4 essential hypertension continue amlodipine   #5 disposition to SNF     Estimated body mass index is 25.2 kg/m as calculated from the following:   Height as of this encounter: '5\' 2"'$  (1.575 m).   Weight as of this encounter: 62.5 kg.  Discharge Instructions  Discharge Instructions     Call MD / Call 911   Complete by: As directed    If you experience chest pain or shortness of breath, CALL 911 and be transported to the hospital emergency room.  If you develope a fever above 101 F, pus (white drainage) or increased drainage or redness at the wound, or calf pain, call your surgeon's office.   Change dressing   Complete by: As directed    You have an adhesive waterproof bandage over the incision. Leave this in place until your first follow-up appointment. Once you remove this  you will not need to place another bandage.   Constipation Prevention   Complete by: As directed    Drink plenty of fluids.  Prune juice may be helpful.  You may use a stool softener, such as Colace (over the counter) 100 mg twice a day.  Use MiraLax (over the counter) for constipation as needed.   Diet - low sodium heart healthy   Complete by: As directed    Do not sit on low chairs, stoools or  toilet seats, as it may be difficult to get up from low surfaces   Complete by: As directed    Driving restrictions   Complete by: As directed    No driving for two weeks   Post-operative opioid taper instructions:   Complete by: As directed    POST-OPERATIVE OPIOID TAPER INSTRUCTIONS: It is important to wean off of your opioid medication as soon as possible. If you do not need pain medication after your surgery it is ok to stop day one. Opioids include: Codeine, Hydrocodone(Norco, Vicodin), Oxycodone(Percocet, oxycontin) and hydromorphone amongst others.  Long term and even short term use of opiods can cause: Increased pain response Dependence Constipation Depression Respiratory depression And more.  Withdrawal symptoms can include Flu like symptoms Nausea, vomiting And more Techniques to manage these symptoms Hydrate well Eat regular healthy meals Stay active Use relaxation techniques(deep breathing, meditating, yoga) Do Not substitute Alcohol to help with tapering If you have been on opioids for less than two weeks and do not have pain than it is ok to stop all together.  Plan to wean off of opioids This plan should start within one week post op of your joint replacement. Maintain the same interval or time between taking each dose and first decrease the dose.  Cut the total daily intake of opioids by one tablet each day Next start to increase the time between doses. The last dose that should be eliminated is the evening dose.      TED hose   Complete by: As directed    Use stockings (TED hose) for three weeks on both leg(s).  You may remove them at night for sleeping.   Weight bearing as tolerated   Complete by: As directed       Allergies as of 08/17/2020       Reactions   Codeine Other (See Comments)   Extreme headaches   Shellfish Allergy Hives   Shellfish-derived Products Hives   Amoxicillin Itching, Rash        Medication List     STOP taking these  medications    naproxen sodium 220 MG tablet Commonly known as: ALEVE       TAKE these medications    amLODipine 2.5 MG tablet Commonly known as: NORVASC Take 2.5 mg by mouth every evening.   aspirin 325 MG EC tablet Take 1 tablet (325 mg total) by mouth daily with breakfast for 18 days. Then resume one 81 mg aspirin once a day. What changed:  medication strength how much to take when to take this additional instructions   clopidogrel 75 MG tablet Commonly known as: PLAVIX Take 1 tablet (75 mg total) by mouth daily. Start taking on: August 18, 2020   docusate sodium 100 MG capsule Commonly known as: COLACE Take 1 capsule (100 mg total) by mouth 2 (two) times daily.   HYDROcodone-acetaminophen 5-325 MG tablet Commonly known as: NORCO/VICODIN Take 1 tablet by mouth every 6 (six) hours as needed for moderate pain or  severe pain (pain score 4-6).   levothyroxine 125 MCG tablet Commonly known as: SYNTHROID Take 1 tablet (125 mcg total) by mouth daily before breakfast. Start taking on: August 18, 2020 What changed:  medication strength how much to take   magnesium oxide 400 MG tablet Commonly known as: MAG-OX Take 400 mg by mouth daily.   methocarbamol 500 MG tablet Commonly known as: ROBAXIN Take 1 tablet (500 mg total) by mouth every 6 (six) hours as needed for muscle spasms.   Prevagen 10 MG Caps Generic drug: Apoaequorin Take 1 capsule by mouth every morning. What changed: how much to take   vitamin B-12 100 MCG tablet Commonly known as: CYANOCOBALAMIN Take 100 mcg by mouth daily.   Vitamin D3 50 MCG (2000 UT) Tabs Take 2,000 Units by mouth daily.               Discharge Care Instructions  (From admission, onward)           Start     Ordered   08/15/20 0000  Weight bearing as tolerated        08/15/20 0817   08/15/20 0000  Change dressing       Comments: You have an adhesive waterproof bandage over the incision. Leave this in place until  your first follow-up appointment. Once you remove this you will not need to place another bandage.   08/15/20 0817            Contact information for follow-up providers     Aluisio, Pilar Plate, MD. Schedule an appointment as soon as possible for a visit in 2 week(s).   Specialty: Orthopedic Surgery Contact information: [address] [place] [address] [id number]         [name], MD Follow up.   Specialty: Internal Medicine Contact information: [address] [place] [address] [phone]              Contact information for after-discharge care     Destination     [facility] Preferred SNF .   Service: Skilled Nursing Contact information: [id number] [place] [employer] [address] [phone]                    Allergies  Allergen Reactions   Codeine Other (See Comments)    Extreme headaches   Shellfish Allergy Hives   Shellfish-Derived Products Hives   Amoxicillin Itching and Rash    Consultations: ortho   Procedures/Studies: CT ANGIO HEAD W OR WO CONTRAST  Result Date: 08/15/2020 CLINICAL DATA:  Neuro deficit, acute, stroke suspected facial droop, slurred speech; Neuro deficit, acute, stroke suspected Facial droop, slurred speech EXAM: CT ANGIOGRAPHY HEAD AND NECK TECHNIQUE: Multidetector CT imaging of the head and neck was performed using the standard protocol during bolus administration of intravenous contrast. Multiplanar CT image reconstructions and MIPs were obtained to evaluate the vascular anatomy. Carotid stenosis measurements (when applicable) are obtained utilizing NASCET criteria, using the distal internal carotid diameter as the denominator. CONTRAST:  10m OMNIPAQUE IOHEXOL 350 MG/ML SOLN COMPARISON:  None. FINDINGS: CTA NECK FINDINGS Aortic arch: Atherosclerosis of the aorta and the great vessels. Great vessel origins are patent. Right carotid system:  Patent. No significant (greater than 50%) stenosis. Moderate calcific and noncalcific atherosclerosis at the carotid bifurcation. Tortuous common and internal carotid arteries. Left carotid system: Patent. Moderate predominantly calcific atherosclerosis at the carotid bifurcation without greater than 50% stenosis. Tortuous common and internal  carotid arteries. Vertebral arteries: Patent. Moderate stenosis of the left vertebral artery origin. Mildly left dominant. Tortuous bilaterally. Skeleton: Severe degenerative disc disease at C6-C7 where there is marked disc height loss, endplate sclerosis and posterior disc osteophyte complex. Other neck: No acute abnormality. Upper chest: Motion limited evaluation without consolidation in the visualized lung apices. Review of the MIP images confirms the above findings CTA HEAD FINDINGS Anterior circulation: Moderate calcific atherosclerosis of bilateral intracranial ICAs. Moderate right and mild left paraclinoid ICA stenosis. Bilateral M1 and proximal M2 MCA branches are patent without proximal hemodynamically significant stenosis. Bilateral ACAs are patent. 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 Posterior circulation: Bilateral intradural vertebral arteries, basilar artery, and posterior cerebral arteries are patent without proximal hemodynamically significant stenosis. No aneurysm identified. Venous sinuses: As permitted by contrast timing, patent. Review of the MIP images confirms the above findings IMPRESSION: CTA head: 1. No large vessel occlusion. 2. Moderate right and mild left paraclinoid ICA stenosis. 3. 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 CTA neck: 1. Moderate stenosis of the left vertebral artery origin. 2. Moderate bilateral carotid bifurcation atherosclerosis without greater than 50% stenosis. Electronically Signed   By: Margaretha Sheffield MD   On: 08/15/2020 19:49   CT ANGIO NECK W OR WO CONTRAST  Result  Date: 08/15/2020 CLINICAL DATA:  Neuro deficit, acute, stroke suspected facial droop, slurred speech; Neuro deficit, acute, stroke suspected Facial droop, slurred speech EXAM: CT ANGIOGRAPHY HEAD AND NECK TECHNIQUE: Multidetector CT imaging of the head and neck was performed using the standard protocol during bolus administration of intravenous contrast. Multiplanar CT image reconstructions and MIPs were obtained to evaluate the vascular anatomy. Carotid stenosis measurements (when applicable) are obtained utilizing NASCET criteria, using the distal internal carotid diameter as the denominator. CONTRAST:  59m OMNIPAQUE IOHEXOL 350 MG/ML SOLN COMPARISON:  None. FINDINGS: CTA NECK FINDINGS Aortic arch: Atherosclerosis of the aorta and the great vessels. Great vessel origins are patent. Right carotid system: Patent. No significant (greater than 50%) stenosis. Moderate calcific and noncalcific atherosclerosis at the carotid bifurcation. Tortuous common and internal carotid arteries. Left carotid system: Patent. Moderate predominantly calcific atherosclerosis at the carotid bifurcation without greater than 50% stenosis. Tortuous common and internal carotid arteries. Vertebral arteries: Patent. Moderate stenosis of the left vertebral artery origin. Mildly left dominant. Tortuous bilaterally. Skeleton: Severe degenerative disc disease at C6-C7 where there is marked disc height loss, endplate sclerosis and posterior disc osteophyte complex. Other neck: No acute abnormality. Upper chest: Motion limited evaluation without consolidation in the visualized lung apices. Review of the MIP images confirms the above findings CTA HEAD FINDINGS Anterior circulation: Moderate calcific atherosclerosis of bilateral intracranial ICAs. Moderate right and mild left paraclinoid ICA stenosis. Bilateral M1 and proximal M2 MCA branches are patent without proximal hemodynamically significant stenosis. Bilateral ACAs are patent. Approximately 1-2  mm left supraclinoid ICA aneurysm (series 7, image 225). Posterior circulation: Bilateral intradural vertebral arteries, basilar artery, and posterior cerebral arteries are patent without proximal hemodynamically significant stenosis. No aneurysm identified. Venous sinuses: As permitted by contrast timing, patent. Review of the MIP images confirms the above findings IMPRESSION: CTA head: 1. No large vessel occlusion. 2. Moderate right and mild left paraclinoid ICA stenosis. 3. 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 CTA neck: 1. Moderate stenosis of the left vertebral artery origin. 2. Moderate bilateral carotid bifurcation atherosclerosis without greater than 50% stenosis. Electronically Signed   By: FMargaretha SheffieldMD   On: 08/15/2020 19:49  MR BRAIN WO CONTRAST  Result Date: 08/16/2020 CLINICAL DATA:  Neuro deficit, acute, stroke suspected; facial droop, slurred speech. Additional history provided: Status post hip surgery August 7th, developed acute onset mild right facial droop and dysarthria 08/15/2020. Evaluate for possible CVA. EXAM: MRI HEAD WITHOUT CONTRAST TECHNIQUE: Multiplanar, multiecho pulse sequences of the brain and surrounding structures were obtained without intravenous contrast. COMPARISON:  Noncontrast head CT and CT angiogram head/neck 08/15/2020. MRI brain 07/02/2019. FINDINGS: Brain: Generalized cerebral and cerebellar atrophy. As before, there is lateral and third ventriculomegaly which appears out proportion to the degree of generalized cerebral volume loss. 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 Advanced patchy and confluent T2/FLAIR hyperintensity within the cerebral white matter, nonspecific but compatible with chronic small vessel ischemic disease. There is no acute infarct. No evidence of an intracranial mass. No chronic intracranial blood products. No extra-axial fluid collection. No midline shift. Vascular: Maintained proximal large  arterial flow voids. Skull and upper cervical spine: Focal suspicious marrow lesion. Sinuses/Orbits: Visualized orbits show no acute finding. Leftward gaze no significant paranasal sinus disease. IMPRESSION: No evidence of acute intracranial abnormality. As before, there is lateral and third ventriculomegaly which appears out of proportion to the degree of generalized cerebral volume loss. These findings suggest normal pressure hydrocephalus. Advanced chronic small vessel ischemic changes within the cerebral white matter. Electronically Signed   By: Kellie Simmering DO   On: 08/16/2020 07:53   DG C-Arm 1-60 Min-No Report  Result Date: 08/13/2020 Fluoroscopy was utilized by the requesting physician.  No radiographic interpretation.   DG HIP OPERATIVE UNILAT W OR W/O PELVIS RIGHT  Result Date: 08/13/2020 CLINICAL DATA:  Surgery, elective Z41.9 (ICD-10-CM) EXAM: OPERATIVE RIGHT HIP (WITH PELVIS IF PERFORMED) 3 VIEWS TECHNIQUE: Fluoroscopic spot image(s) were submitted for interpretation post-operatively. COMPARISON:  08/12/2020. FINDINGS: Fluoro time: 26 seconds Reported radiation dose: 4.9 mGy Three C-arm fluoroscopic images were obtained intraoperatively and submitted for post operative interpretation. These images demonstrate intramedullary nail and screw fixation of an intertrochanteric right femur fracture with near anatomic alignment. Please see the performing provider's procedural report for further detail. IMPRESSION: Intraoperative fluoroscopy, as detailed above. Electronically Signed   By: Margaretha Sheffield MD   On: 08/13/2020 09:08   DG Hip Unilat  With Pelvis 2-3 Views Right  Result Date: 08/12/2020 CLINICAL DATA:  Acute RIGHT hip pain following fall. EXAM: DG HIP (WITH OR WITHOUT PELVIS) 2-3V RIGHT COMPARISON:  None. FINDINGS: An intertrochanteric fracture of the proximal RIGHT femur is identified with slight varus angulation. No dislocation noted. Degenerative changes in the hips and LOWER lumbar  spine are present. IMPRESSION: Intertrochanteric RIGHT femur fracture with slight varus angulation. Electronically Signed   By: Margarette Canada M.D.   On: 08/12/2020 11:44   CT HEAD CODE STROKE WO CONTRAST  Addendum Date: 08/15/2020   ADDENDUM REPORT: 08/15/2020 12:02 ADDENDUM: Following multiple attempts to reach the ordering provider, these results were discussed by telephone on 08/15/2020 at 11:58 pm with provider DAVID Sabino Gasser , who verbally acknowledged these results. Electronically Signed   By: Kellie Simmering DO   On: 08/15/2020 12:02   Result Date: 08/15/2020 CLINICAL DATA:  Code stroke. Neuro deficit, acute, stroke suspected. Additional provided: Fall a few days ago, right hip surgery, right facial droop. EXAM: CT HEAD WITHOUT CONTRAST TECHNIQUE: Contiguous axial images were obtained from the base of the skull through the vertex without intravenous contrast. COMPARISON:  Brain MRI 07/02/2019. MRA head 06/29/2019. Head CT 05/20/2019. FINDINGS: Brain: As before, there  is lateral and third ventriculomegaly which is out of proportion to the degree of generalized cerebral atrophy, and suggestive of normal pressure hydrocephalus. Moderate/advanced patchy and confluent hypoattenuation within the cerebral white matter, nonspecific but compatible with chronic small vessel ischemic disease. There is no acute intracranial hemorrhage. No demarcated cortical infarct. No extra-axial fluid collection. No evidence of an intracranial mass. No midline shift. Vascular: No hyperdense vessel.  Atherosclerotic calcifications. Skull: Normal. Negative for fracture or focal lesion. Sinuses/Orbits: Visualized orbits show no acute finding. No significant paranasal sinus disease. Other: Prominent torus palatinus. ASPECTS ([place] Stroke Program Early CT Score) - Ganglionic level infarction (caudate, lentiform nuclei, internal capsule, insula, M1-M3 cortex): 7 - Supraganglionic infarction (M4-M6 cortex): 3 Total score (0-10 with 10 being  normal): 10 IMPRESSION: No acute intracranial hemorrhage or acute demarcated cortical infarction. ASPECTS is 10. As before, there is lateral and third ventriculomegaly which is out of proportion to the degree of generalized cerebral atrophy. 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 Moderate/advanced chronic small vessel ischemic changes within the cerebral white matter. Electronically Signed: By: Kellie Simmering DO On: 08/15/2020 11:38   (Echo, Carotid, EGD, Colonoscopy, ERCP)    Subjective:  She is resting in bed awake alert answering questions appropriately speech is clear denies any new complaints no overnight events Discharge Exam: Vitals:   08/17/20 0506 08/17/20 0508  BP: (!) 144/56   Pulse:  79  Resp:  17  Temp:  (!) 97.2 F (36.2 C)  SpO2:     Vitals:   08/16/20 1220 08/16/20 1613 08/17/20 0506 08/17/20 0508  BP: 136/62 (!) 146/66 (!) 144/56   Pulse: 91   79  Resp: 15   17  Temp: 98.3 F (36.8 C)   (!) 97.2 F (36.2 C)  TempSrc: Oral   Oral  SpO2: 97%     Weight:      Height:        General: Pt is alert, awake, not in acute distress Cardiovascular: RRR, S1/S2 +, no rubs, no gallops Respiratory: CTA bilaterally, no wheezing, no rhonchi Abdominal: Soft, NT, ND, bowel sounds + Extremities: Left hip incision covered with dressing.   The results of significant diagnostics from this hospitalization (including imaging, microbiology, ancillary and laboratory) are listed below for reference.     Microbiology: Recent Results (from the past 240 hour(s))  Resp Panel by RT-PCR (Flu A&B, Covid) Nasopharyngeal Swab     Status: None   Collection Time: 08/12/20 12:11 PM   Specimen: Nasopharyngeal Swab; Nasopharyngeal(NP) swabs in vial transport medium  Result Value Ref Range Status   SARS Coronavirus 2 by RT PCR NEGATIVE NEGATIVE Final    Comment: (NOTE) SARS-CoV-2 target nucleic acids are NOT DETECTED.  The SARS-CoV-2 RNA is generally detectable in upper  respiratory specimens during the acute phase of infection. The lowest concentration of SARS-CoV-2 viral copies this assay can detect is 138 copies/mL. A negative result does not preclude SARS-Cov-2 infection and should not be used as the sole basis for treatment or other patient management decisions. A negative result may occur with  improper specimen collection/handling, submission of specimen other than nasopharyngeal swab, presence of viral mutation(s) within the areas targeted by this assay, and inadequate number of viral copies(<138 copies/mL). A negative result must be combined with clinical observations, patient history, and epidemiological information. The expected result is Negative.  Fact Sheet for Patients:  EntrepreneurPulse.com.au  Fact Sheet for Healthcare Providers:  IncredibleEmployment.be  This test is no t yet approved or  cleared by the [place] and  has been authorized for detection and/or diagnosis of SARS-CoV-2 by FDA under an Emergency Use Authorization (EUA). This EUA will remain  in effect (meaning this test can be used) for the duration of the COVID-19 declaration under Section 564(b)(1) of the Act, 21 U.S.C.section 360bbb-3(b)(1), unless the authorization is terminated  or revoked sooner.       Influenza A by PCR NEGATIVE NEGATIVE Final   Influenza B by PCR NEGATIVE NEGATIVE Final    Comment: (NOTE) The Xpert Xpress SARS-CoV-2/FLU/RSV plus assay is intended as an aid in the diagnosis of influenza from Nasopharyngeal swab specimens and should not be used as a sole basis for treatment. Nasal washings and aspirates are unacceptable for Xpert Xpress SARS-CoV-2/FLU/RSV testing.  Fact Sheet for Patients: EntrepreneurPulse.com.au  Fact Sheet for Healthcare Providers: IncredibleEmployment.be  This test is not yet approved or cleared by the [place] FDA and has been  authorized for detection and/or diagnosis of SARS-CoV-2 by FDA under an Emergency Use Authorization (EUA). This EUA will remain in effect (meaning this test can be used) for the duration of the COVID-19 declaration under Section 564(b)(1) of the Act, 21 U.S.C. section 360bbb-3(b)(1), unless the authorization is terminated or revoked.  Performed at [facility], [address] [place], [place] [address]   Surgical pcr screen     Status: None   Collection Time: 08/12/20  5:32 PM   Specimen: Nasal Mucosa; Nasal Swab  Result Value Ref Range Status   MRSA, PCR NEGATIVE NEGATIVE Final   Staphylococcus aureus NEGATIVE NEGATIVE Final    Comment: (NOTE) The Xpert SA Assay (FDA approved for NASAL specimens in patients 23 years of age and older), is one component of a comprehensive surveillance program. It is not intended to diagnose infection nor to guide or monitor treatment. Performed at [facility], [place] [address] [place], [place] [address]      Labs: BNP (last 3 results) No results for input(s): BNP in the last 8760 hours. Basic Metabolic Panel: Recent Labs  Lab 08/13/20 0230 08/14/20 0321 08/15/20 0331 08/16/20 0358 08/17/20 0348  NA 128* 130* 131* 135 134*  K 3.8 4.0 3.9 3.6 3.7  CL 95* 98 97* 101 100  CO2 '25 26 28 '$ 32 28  GLUCOSE 128* 94 94 90 99  BUN '14 14 13 8 12  '$ CREATININE 0.57 0.67 0.58 0.49 0.53  CALCIUM 8.8* 8.3* 8.1* 8.3* 8.3*  MG 2.0 1.8 1.9 2.0 2.1   Liver Function Tests: No results for input(s): AST, ALT, ALKPHOS, BILITOT, PROT, ALBUMIN in the last 168 hours. No results for input(s): LIPASE, AMYLASE in the last 168 hours. No results for input(s): AMMONIA in the last 168 hours. CBC: Recent Labs  Lab 08/13/20 0230 08/14/20 0426 08/15/20 0331 08/16/20 0358 08/17/20 0348  WBC 6.1 7.5 4.1 3.8* 4.4  NEUTROABS 4.2 4.6 1.6* 1.2* 1.7  HGB 11.5* 8.6* 8.1* 8.1* 8.0*  HCT 35.0* 27.1* 25.0* 25.1* 25.4*  MCV 85.4 88.0  87.7 88.1 88.8  PLT 310 240 222 244 301   Cardiac Enzymes: No results for input(s): CKTOTAL, CKMB, CKMBINDEX, TROPONINI in the last 168 hours. BNP: Invalid input(s): POCBNP CBG: Recent Labs  Lab 08/15/20 1110  GLUCAP 113*   D-Dimer No results for input(s): DDIMER in the last 72 hours. Hgb A1c Recent Labs    08/15/20 0331  HGBA1C 5.8*   Lipid Profile Recent Labs    08/15/20 0331  CHOL 132  HDL  50  LDLCALC 69  TRIG 64  CHOLHDL 2.6   Thyroid function studies No results for input(s): TSH, T4TOTAL, T3FREE, THYROIDAB in the last 72 hours.  Invalid input(s): FREET3 Anemia work up No results for input(s): VITAMINB12, FOLATE, FERRITIN, TIBC, IRON, RETICCTPCT in the last 72 hours. Urinalysis No results found for: COLORURINE, APPEARANCEUR, [place], [place], GLUCOSEU, [place], [place], [place], PROTEINUR, UROBILINOGEN, NITRITE, LEUKOCYTESUR Sepsis Labs Invalid input(s): PROCALCITONIN,  WBC,  LACTICIDVEN Microbiology Recent Results (from the past 240 hour(s))  Resp Panel by RT-PCR (Flu A&B, Covid) Nasopharyngeal Swab     Status: None   Collection Time: 08/12/20 12:11 PM   Specimen: Nasopharyngeal Swab; Nasopharyngeal(NP) swabs in vial transport medium  Result Value Ref Range Status   SARS Coronavirus 2 by RT PCR NEGATIVE NEGATIVE Final    Comment: (NOTE) SARS-CoV-2 target nucleic acids are NOT DETECTED.  The SARS-CoV-2 RNA is generally detectable in upper respiratory specimens during the acute phase of infection. The lowest concentration of SARS-CoV-2 viral copies this assay can detect is 138 copies/mL. A negative result does not preclude SARS-Cov-2 infection and should not be used as the sole basis for treatment or other patient management decisions. A negative result may occur with  improper specimen collection/handling, submission of specimen other than nasopharyngeal swab, presence of viral mutation(s) within the areas targeted by this assay, and inadequate  number of viral copies(<138 copies/mL). A negative result must be combined with clinical observations, patient history, and epidemiological information. The expected result is Negative.  Fact Sheet for Patients:  EntrepreneurPulse.com.au  Fact Sheet for Healthcare Providers:  IncredibleEmployment.be  This test is no t yet approved or cleared by the [place] FDA and  has been authorized for detection and/or diagnosis of SARS-CoV-2 by FDA under an Emergency Use Authorization (EUA). This EUA will remain  in effect (meaning this test can be used) for the duration of the COVID-19 declaration under Section 564(b)(1) of the Act, 21 U.S.C.section 360bbb-3(b)(1), unless the authorization is terminated  or revoked sooner.       Influenza A by PCR NEGATIVE NEGATIVE Final   Influenza B by PCR NEGATIVE NEGATIVE Final    Comment: (NOTE) The Xpert Xpress SARS-CoV-2/FLU/RSV plus assay is intended as an aid in the diagnosis of influenza from Nasopharyngeal swab specimens and should not be used as a sole basis for treatment. Nasal washings and aspirates are unacceptable for Xpert Xpress SARS-CoV-2/FLU/RSV testing.  Fact Sheet for Patients: EntrepreneurPulse.com.au  Fact Sheet for Healthcare Providers: IncredibleEmployment.be  This test is not yet approved or cleared by the [place] FDA and has been authorized for detection and/or diagnosis of SARS-CoV-2 by FDA under an Emergency Use Authorization (EUA). This EUA will remain in effect (meaning this test can be used) for the duration of the COVID-19 declaration under Section 564(b)(1) of the Act, 21 U.S.C. section 360bbb-3(b)(1), unless the authorization is terminated or revoked.  Performed at [facility], [address], [place], [place] [address]   Surgical pcr screen     Status: None   Collection Time: 08/12/20  5:32 PM   Specimen:  Nasal Mucosa; Nasal Swab  Result Value Ref Range Status   MRSA, PCR NEGATIVE NEGATIVE Final   Staphylococcus aureus NEGATIVE NEGATIVE Final    Comment: (NOTE) The Xpert SA Assay (FDA approved for NASAL specimens in patients 49 years of age and older), is one component of a comprehensive surveillance program. It is not intended to diagnose infection nor to guide or monitor treatment. Performed at [facility]  [place] [address], [place], [place] [address]      Time coordinating discharge:39 minutes  SIGNED:   Georgette Shell, MD  [facility] 08/17/2020, 9:53 AM

## 2020-08-17 NOTE — Plan of Care (Signed)
  Problem: Health Behavior/Discharge Planning: Goal: Ability to manage health-related needs will improve Outcome: Progressing   Problem: Clinical Measurements: Goal: Will remain free from infection Outcome: Progressing Goal: Diagnostic test results will improve Outcome: Progressing Goal: Respiratory complications will improve Outcome: Progressing   Problem: Activity: Goal: Risk for activity intolerance will decrease Outcome: Progressing   Problem: Nutrition: Goal: Adequate nutrition will be maintained Outcome: Progressing   Problem: Coping: Goal: Level of anxiety will decrease Outcome: Progressing   Problem: Elimination: Goal: Will not experience complications related to bowel motility Outcome: Progressing Goal: Will not experience complications related to urinary retention Outcome: Progressing   Problem: Pain Managment: Goal: General experience of comfort will improve Outcome: Progressing   Problem: Safety: Goal: Ability to remain free from injury will improve Outcome: Progressing   Problem: Education: Goal: Knowledge of the prescribed therapeutic regimen will improve Outcome: Progressing Goal: Understanding of discharge needs will improve Outcome: Progressing   Problem: Activity: Goal: Ability to avoid complications of mobility impairment will improve Outcome: Progressing Goal: Ability to tolerate increased activity will improve Outcome: Progressing   Problem: Clinical Measurements: Goal: Postoperative complications will be avoided or minimized Outcome: Progressing   Problem: Pain Management: Goal: Pain level will decrease with appropriate interventions Outcome: Progressing   Problem: Skin Integrity: Goal: Will show signs of wound healing Outcome: Progressing

## 2020-08-17 NOTE — TOC Transition Note (Signed)
Transition of Care [facility]) - CM/SW Discharge Note  Patient Details  Name: ISSA DIGERONIMO MRN: [id number] Date of Birth: 07-09-1925  [facility] CM/SW Contact:  Sherie Don, LCSW Phone Number: 08/17/2020, 2:30 PM  Clinical Narrative: Patient is ready for discharge. Per [place] with [facility], patient will go to room 223. Discharge summary, discharge orders, and SNF transfer report faxed to facility in hub. COVID test is negative. Medical necessity form done; PTAR scheduled. Discharge packet completed. RN updated. CSW called patient's daughter-in-law, Stirling Rustad, to notify her of discharge. TOC signing off.  Final next level of care: Skilled Nursing Facility Barriers to Discharge: Barriers Resolved  Patient Goals and CMS Choice Patient states their goals for this hospitalization and ongoing recovery are:: Go to rehab CMS Medicare.gov Compare Post Acute Care list provided to:: Patient Represenative (must comment) Choice offered to / list presented to : Adult Children Marlowe Kays (daughter-in-law))  Discharge Placement PASRR number recieved: 08/14/20     Patient chooses bed at: [facility] Patient to be transferred to facility by: [facility] Name of family member notified: Delaysia Paup (daughter-in-law) Patient and family notified of of transfer: 08/17/20  Discharge Plan and Services In-house Referral: Clinical Social Work Post Acute Care Choice: [place]          DME Arranged: N/A DME Agency: NA  Readmission Risk Interventions No flowsheet data found.

## 2020-08-17 NOTE — Progress Notes (Signed)
SMOG enema administered with little to some result and pt stated minor relief. Will give suppository next but pt wanted a break to rest a bit.

## 2020-08-17 NOTE — Progress Notes (Signed)
PROGRESS NOTE    Tanya [name]  [id number] DOB: 1925/11/25 DOA: 08/12/2020 PCP: [name], MD   Brief Narrative: [name] is a 87 year old female with PMH TIA, vitamin B12 deficiency, mild cognitive impairment who presented to the ER after mechanical fall at home. She states that she was watching TV then upon standing up she saw a bug and lifted her right leg to step on it when she lost her footing and fell onto her right side.  She began experiencing significant right-sided hip pain after her fall. X-rays on work-up in the ER showed an intertrochanteric right femur fracture with slight varus angulation. She was transferred to [facility] for orthopedic surgery evaluation and probable surgical repair. Typically she is very mobile and ambulates well at home.  She can climb stairs with no difficulty nor any associated chest pain or shortness of breath. No known cardiac conditions.  Had left shoulder surgery several years ago but no known issues with anesthesia at that time either. She underwent IM nail fixation on 08/13/20 with orthopedic surgery.   Patient had been recovering as expected until 08/15/2020 when she developed acute onset of mild right facial droop and dysarthria.  A code stroke was initiated and she was evaluated by tele neurology.  Assessment & Plan:   Principal Problem:   Intertrochanteric fracture of right femur (HCC) Active Problems:   Hypothyroidism   HTN (hypertension)   B12 deficiency   History of TIA (transient ischemic attack)   Acute respiratory failure with hypoxia (HCC)   Hip fracture (HCC)   Dysarthria  #1 intertrochanteric fracture of the right femur status post mechanical fall.  Status post surgery on aspirin 325 mg daily.  PT is recommending skilled nursing facility for rehab.  #2  Dysarthria patient developed right facial droop and dysarthria on 08/15/2020.  This has resolved.  CT of the head does not show any acute infarct.  MRI of the brain does not  show any acute infarct.  CT angiogram of the head and neck does show some blockages in the vertebral and carotid vessels.  Discussed with neurology we will continue aspirin and start Plavix.  #3 hypothyroidism Synthroid dose has been increased to 125 MCG during this admission.  Rec repeat TSH in 6 weeks.  Her TSH this admission was 24.6.  #4 essential hypertension continue amlodipine  #5 disposition to SNF   #6 constipation patient had nausea and vomiting when transportation was here to pick her up.  Patient reported she has not had a BM since admission.  After giving Dulcolax suppository she had one small BM this morning.  We will cancel discharge obtain stat KUB and give her a smog enema.  Estimated body mass index is 25.2 kg/m as calculated from the following:   Height as of this encounter: '5\' 2"'$  (1.575 m).   Weight as of this encounter: 62.5 kg.  DVT prophylaxis: Aspirin Family Communication: Discussed with daughter-in-law at bedside  disposition Plan:  Status is: Inpatient  Remains inpatient appropriate because:Inpatient level of care appropriate due to severity of illness  Dispo: The patient is from: Home              Anticipated d/c is to: SNF              Patient currently is medically stable to d/c.   Difficult to place patient No       Consultants:  Ortho and neuro  Procedures: Hip surgery right intramedullary nail  Antimicrobials:none  Subjective:  She is resting in bed no complaints awake alert speaking in full sentences however she has not had a BM since admission.  Objective: Vitals:   08/16/20 1613 08/17/20 0506 08/17/20 0508 08/17/20 1241  BP: (!) 146/66 (!) 144/56  (!) 125/58  Pulse:   79 96  Resp:   17 16  Temp:   (!) 97.2 F (36.2 C) 98 F (36.7 C)  TempSrc:   Oral Oral  SpO2:    100%  Weight:      Height:        Intake/Output Summary (Last 24 hours) at 08/17/2020 1541 Last data filed at 08/17/2020 1300 Gross per 24 hour  Intake 480 ml   Output --  Net 480 ml    Filed Weights   08/12/20 1055 08/12/20 1500  Weight: 64 kg 62.5 kg    Examination:  General exam: Appears calm and comfortable  Respiratory system: Clear to auscultation. Respiratory effort normal. Cardiovascular system: S1 & S2 heard, RRR. No JVD, murmurs, rubs, gallops or clicks. No pedal edema. Gastrointestinal system: Abdomen is nondistended, soft and nontender. No organomegaly or masses felt. Normal bowel sounds heard. Central nervous system: Alert and oriented. No focal neurological deficits. Extremities: Symmetric 5 x 5 power. Skin: No rashes, lesions or ulcers Psychiatry: Judgement and insight appear normal. Mood & affect appropriate.     Data Reviewed: I have personally reviewed following labs and imaging studies  CBC: Recent Labs  Lab 08/13/20 0230 08/14/20 0426 08/15/20 0331 08/16/20 0358 08/17/20 0348  WBC 6.1 7.5 4.1 3.8* 4.4  NEUTROABS 4.2 4.6 1.6* 1.2* 1.7  HGB 11.5* 8.6* 8.1* 8.1* 8.0*  HCT 35.0* 27.1* 25.0* 25.1* 25.4*  MCV 85.4 88.0 87.7 88.1 88.8  PLT 310 240 222 244 [id number]    Basic Metabolic Panel: Recent Labs  Lab 08/13/20 0230 08/14/20 0321 08/15/20 0331 08/16/20 0358 08/17/20 0348  NA 128* 130* 131* 135 134*  K 3.8 4.0 3.9 3.6 3.7  CL 95* 98 97* 101 100  CO2 '25 26 28 '$ 32 28  GLUCOSE 128* 94 94 90 99  BUN '14 14 13 8 12  '$ CREATININE 0.57 0.67 0.58 0.49 0.53  CALCIUM 8.8* 8.3* 8.1* 8.3* 8.3*  MG 2.0 1.8 1.9 2.0 2.1    GFR: Estimated Creatinine Clearance: 39 mL/min (by C-G formula based on SCr of 0.53 mg/dL). Liver Function Tests: No results for input(s): AST, ALT, ALKPHOS, BILITOT, PROT, ALBUMIN in the last 168 hours. No results for input(s): LIPASE, AMYLASE in the last 168 hours. No results for input(s): AMMONIA in the last 168 hours. Coagulation Profile: Recent Labs  Lab 08/12/20 1151  INR 1.0    Cardiac Enzymes: No results for input(s): CKTOTAL, CKMB, CKMBINDEX, TROPONINI in the last 168 hours. BNP  (last 3 results) No results for input(s): PROBNP in the last 8760 hours. HbA1C: Recent Labs    08/15/20 0331  HGBA1C 5.8*    CBG: Recent Labs  Lab 08/15/20 1110  GLUCAP 113*    Lipid Profile: Recent Labs    08/15/20 0331  CHOL 132  HDL 50  LDLCALC 69  TRIG 64  CHOLHDL 2.6    Thyroid Function Tests: No results for input(s): TSH, T4TOTAL, FREET4, T3FREE, THYROIDAB in the last 72 hours. Anemia Panel: No results for input(s): VITAMINB12, FOLATE, FERRITIN, TIBC, IRON, RETICCTPCT in the last 72 hours. Sepsis Labs: No results for input(s): PROCALCITON, LATICACIDVEN in the last 168 hours.  Recent Results (from the past 240 hour(s))  Resp Panel  by RT-PCR (Flu A&B, Covid) Nasopharyngeal Swab     Status: None   Collection Time: 08/12/20 12:11 PM   Specimen: Nasopharyngeal Swab; Nasopharyngeal(NP) swabs in vial transport medium  Result Value Ref Range Status   SARS Coronavirus 2 by RT PCR NEGATIVE NEGATIVE Final    Comment: (NOTE) SARS-CoV-2 target nucleic acids are NOT DETECTED.  The SARS-CoV-2 RNA is generally detectable in upper respiratory specimens during the acute phase of infection. The lowest concentration of SARS-CoV-2 viral copies this assay can detect is 138 copies/mL. A negative result does not preclude SARS-Cov-2 infection and should not be used as the sole basis for treatment or other patient management decisions. A negative result may occur with  improper specimen collection/handling, submission of specimen other than nasopharyngeal swab, presence of viral mutation(s) within the areas targeted by this assay, and inadequate number of viral copies(<138 copies/mL). A negative result must be combined with clinical observations, patient history, and epidemiological information. The expected result is Negative.  Fact Sheet for Patients:  EntrepreneurPulse.com.au  Fact Sheet for Healthcare Providers:   IncredibleEmployment.be  This test is no t yet approved or cleared by the [place] FDA and  has been authorized for detection and/or diagnosis of SARS-CoV-2 by FDA under an Emergency Use Authorization (EUA). This EUA will remain  in effect (meaning this test can be used) for the duration of the COVID-19 declaration under Section 564(b)(1) of the Act, 21 U.S.C.section 360bbb-3(b)(1), unless the authorization is terminated  or revoked sooner.       Influenza A by PCR NEGATIVE NEGATIVE Final   Influenza B by PCR NEGATIVE NEGATIVE Final    Comment: (NOTE) The Xpert Xpress SARS-CoV-2/FLU/RSV plus assay is intended as an aid in the diagnosis of influenza from Nasopharyngeal swab specimens and should not be used as a sole basis for treatment. Nasal washings and aspirates are unacceptable for Xpert Xpress SARS-CoV-2/FLU/RSV testing.  Fact Sheet for Patients: EntrepreneurPulse.com.au  Fact Sheet for Healthcare Providers: IncredibleEmployment.be  This test is not yet approved or cleared by the [place] FDA and has been authorized for detection and/or diagnosis of SARS-CoV-2 by FDA under an Emergency Use Authorization (EUA). This EUA will remain in effect (meaning this test can be used) for the duration of the COVID-19 declaration under Section 564(b)(1) of the Act, 21 U.S.C. section 360bbb-3(b)(1), unless the authorization is terminated or revoked.  Performed at [facility], [address], [place], [place] [address]   Surgical pcr screen     Status: None   Collection Time: 08/12/20  5:32 PM   Specimen: Nasal Mucosa; Nasal Swab  Result Value Ref Range Status   MRSA, PCR NEGATIVE NEGATIVE Final   Staphylococcus aureus NEGATIVE NEGATIVE Final    Comment: (NOTE) The Xpert SA Assay (FDA approved for NASAL specimens in patients 92 years of age and older), is one component of a  comprehensive surveillance program. It is not intended to diagnose infection nor to guide or monitor treatment. Performed at [facility], [place] [address] [place], [place] [address]   Resp Panel by RT-PCR (Flu A&B, Covid) Nasopharyngeal Swab     Status: None   Collection Time: 08/17/20 12:05 PM   Specimen: Nasopharyngeal Swab; Nasopharyngeal(NP) swabs in vial transport medium  Result Value Ref Range Status   SARS Coronavirus 2 by RT PCR NEGATIVE NEGATIVE Final    Comment: (NOTE) SARS-CoV-2 target nucleic acids are NOT DETECTED.  The SARS-CoV-2 RNA is generally detectable in upper respiratory specimens during the  acute phase of infection. The lowest concentration of SARS-CoV-2 viral copies this assay can detect is 138 copies/mL. A negative result does not preclude SARS-Cov-2 infection and should not be used as the sole basis for treatment or other patient management decisions. A negative result may occur with  improper specimen collection/handling, submission of specimen other than nasopharyngeal swab, presence of viral mutation(s) within the areas targeted by this assay, and inadequate number of viral copies(<138 copies/mL). A negative result must be combined with clinical observations, patient history, and epidemiological information. The expected result is Negative.  Fact Sheet for Patients:  EntrepreneurPulse.com.au  Fact Sheet for Healthcare Providers:  IncredibleEmployment.be  This test is no t yet approved or cleared by the [place] FDA and  has been authorized for detection and/or diagnosis of SARS-CoV-2 by FDA under an Emergency Use Authorization (EUA). This EUA will remain  in effect (meaning this test can be used) for the duration of the COVID-19 declaration under Section 564(b)(1) of the Act, 21 U.S.C.section 360bbb-3(b)(1), unless the authorization is terminated  or revoked sooner.       Influenza A by  PCR NEGATIVE NEGATIVE Final   Influenza B by PCR NEGATIVE NEGATIVE Final    Comment: (NOTE) The Xpert Xpress SARS-CoV-2/FLU/RSV plus assay is intended as an aid in the diagnosis of influenza from Nasopharyngeal swab specimens and should not be used as a sole basis for treatment. Nasal washings and aspirates are unacceptable for Xpert Xpress SARS-CoV-2/FLU/RSV testing.  Fact Sheet for Patients: EntrepreneurPulse.com.au  Fact Sheet for Healthcare Providers: IncredibleEmployment.be  This test is not yet approved or cleared by the [place] FDA and has been authorized for detection and/or diagnosis of SARS-CoV-2 by FDA under an Emergency Use Authorization (EUA). This EUA will remain in effect (meaning this test can be used) for the duration of the COVID-19 declaration under Section 564(b)(1) of the Act, 21 U.S.C. section 360bbb-3(b)(1), unless the authorization is terminated or revoked.  Performed at [facility], [place] [address], [place], [place] [address]           Radiology Studies: CT ANGIO HEAD W OR WO CONTRAST  Result Date: 08/15/2020 CLINICAL DATA:  Neuro deficit, acute, stroke suspected facial droop, slurred speech; Neuro deficit, acute, stroke suspected Facial droop, slurred speech EXAM: CT ANGIOGRAPHY HEAD AND NECK TECHNIQUE: Multidetector CT imaging of the head and neck was performed using the standard protocol during bolus administration of intravenous contrast. Multiplanar CT image reconstructions and MIPs were obtained to evaluate the vascular anatomy. Carotid stenosis measurements (when applicable) are obtained utilizing NASCET criteria, using the distal internal carotid diameter as the denominator. CONTRAST:  73m OMNIPAQUE IOHEXOL 350 MG/ML SOLN COMPARISON:  None. FINDINGS: CTA NECK FINDINGS Aortic arch: Atherosclerosis of the aorta and the great vessels. Great vessel origins are patent. Right carotid system:  Patent. No significant (greater than 50%) stenosis. Moderate calcific and noncalcific atherosclerosis at the carotid bifurcation. Tortuous common and internal carotid arteries. Left carotid system: Patent. Moderate predominantly calcific atherosclerosis at the carotid bifurcation without greater than 50% stenosis. Tortuous common and internal carotid arteries. Vertebral arteries: Patent. Moderate stenosis of the left vertebral artery origin. Mildly left dominant. Tortuous bilaterally. Skeleton: Severe degenerative disc disease at C6-C7 where there is marked disc height loss, endplate sclerosis and posterior disc osteophyte complex. Other neck: No acute abnormality. Upper chest: Motion limited evaluation without consolidation in the visualized lung apices. Review of the MIP images confirms the above findings CTA HEAD FINDINGS Anterior circulation: Moderate calcific  atherosclerosis of bilateral intracranial ICAs. Moderate right and mild left paraclinoid ICA stenosis. Bilateral M1 and proximal M2 MCA branches are patent without proximal hemodynamically significant stenosis. Bilateral ACAs are patent. 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 Posterior circulation: Bilateral intradural vertebral arteries, basilar artery, and posterior cerebral arteries are patent without proximal hemodynamically significant stenosis. No aneurysm identified. Venous sinuses: As permitted by contrast timing, patent. Review of the MIP images confirms the above findings IMPRESSION: CTA head: 1. No large vessel occlusion. 2. Moderate right and mild left paraclinoid ICA stenosis. 3. 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 CTA neck: 1. Moderate stenosis of the left vertebral artery origin. 2. Moderate bilateral carotid bifurcation atherosclerosis without greater than 50% stenosis. Electronically Signed   By: Margaretha Sheffield MD   On: 08/15/2020 19:49   CT ANGIO NECK W OR WO CONTRAST  Result  Date: 08/15/2020 CLINICAL DATA:  Neuro deficit, acute, stroke suspected facial droop, slurred speech; Neuro deficit, acute, stroke suspected Facial droop, slurred speech EXAM: CT ANGIOGRAPHY HEAD AND NECK TECHNIQUE: Multidetector CT imaging of the head and neck was performed using the standard protocol during bolus administration of intravenous contrast. Multiplanar CT image reconstructions and MIPs were obtained to evaluate the vascular anatomy. Carotid stenosis measurements (when applicable) are obtained utilizing NASCET criteria, using the distal internal carotid diameter as the denominator. CONTRAST:  49m OMNIPAQUE IOHEXOL 350 MG/ML SOLN COMPARISON:  None. FINDINGS: CTA NECK FINDINGS Aortic arch: Atherosclerosis of the aorta and the great vessels. Great vessel origins are patent. Right carotid system: Patent. No significant (greater than 50%) stenosis. Moderate calcific and noncalcific atherosclerosis at the carotid bifurcation. Tortuous common and internal carotid arteries. Left carotid system: Patent. Moderate predominantly calcific atherosclerosis at the carotid bifurcation without greater than 50% stenosis. Tortuous common and internal carotid arteries. Vertebral arteries: Patent. Moderate stenosis of the left vertebral artery origin. Mildly left dominant. Tortuous bilaterally. Skeleton: Severe degenerative disc disease at C6-C7 where there is marked disc height loss, endplate sclerosis and posterior disc osteophyte complex. Other neck: No acute abnormality. Upper chest: Motion limited evaluation without consolidation in the visualized lung apices. Review of the MIP images confirms the above findings CTA HEAD FINDINGS Anterior circulation: Moderate calcific atherosclerosis of bilateral intracranial ICAs. Moderate right and mild left paraclinoid ICA stenosis. Bilateral M1 and proximal M2 MCA branches are patent without proximal hemodynamically significant stenosis. Bilateral ACAs are patent. Approximately 1-2  mm left supraclinoid ICA aneurysm (series 7, image 225). Posterior circulation: Bilateral intradural vertebral arteries, basilar artery, and posterior cerebral arteries are patent without proximal hemodynamically significant stenosis. No aneurysm identified. Venous sinuses: As permitted by contrast timing, patent. Review of the MIP images confirms the above findings IMPRESSION: CTA head: 1. No large vessel occlusion. 2. Moderate right and mild left paraclinoid ICA stenosis. 3. 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 CTA neck: 1. Moderate stenosis of the left vertebral artery origin. 2. Moderate bilateral carotid bifurcation atherosclerosis without greater than 50% stenosis. Electronically Signed   By: FMargaretha SheffieldMD   On: 08/15/2020 19:49   MR BRAIN WO CONTRAST  Result Date: 08/16/2020 CLINICAL DATA:  Neuro deficit, acute, stroke suspected; facial droop, slurred speech. Additional history provided: Status post hip surgery August 7th, developed acute onset mild right facial droop and dysarthria 08/15/2020. Evaluate for possible CVA. EXAM: MRI HEAD WITHOUT CONTRAST TECHNIQUE: Multiplanar, multiecho pulse sequences of the brain and surrounding structures were obtained without intravenous contrast. COMPARISON:  Noncontrast head CT and CT  angiogram head/neck 08/15/2020. MRI brain 07/02/2019. FINDINGS: Brain: Generalized cerebral and cerebellar atrophy. As before, there is lateral and third ventriculomegaly which appears out proportion to the degree of generalized cerebral volume loss. 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 Advanced patchy and confluent T2/FLAIR hyperintensity within the cerebral white matter, nonspecific but compatible with chronic small vessel ischemic disease. There is no acute infarct. No evidence of an intracranial mass. No chronic intracranial blood products. No extra-axial fluid collection. No midline shift. Vascular: Maintained proximal large  arterial flow voids. Skull and upper cervical spine: Focal suspicious marrow lesion. Sinuses/Orbits: Visualized orbits show no acute finding. Leftward gaze no significant paranasal sinus disease. IMPRESSION: No evidence of acute intracranial abnormality. As before, there is lateral and third ventriculomegaly which appears out of proportion to the degree of generalized cerebral volume loss. These findings suggest normal pressure hydrocephalus. Advanced chronic small vessel ischemic changes within the cerebral white matter. Electronically Signed   By: Kellie Simmering DO   On: 08/16/2020 07:53   ECHOCARDIOGRAM COMPLETE  Result Date: 08/17/2020    ECHOCARDIOGRAM REPORT   Patient Name:   Tanya [name] Stewart Date of Exam: 08/17/2020 Medical Rec #:  [id number]      Height:       62.0 in Accession #:    [id number]     Weight:       137.8 lb Date of Birth:  12/05/1925      BSA:          31.632 m Patient Age:    27 years       BP:           144/56 mmHg Patient Gender: F              HR:           88 bpm. Exam Location:  Inpatient Procedure: 2D Echo, Cardiac Doppler and Color Doppler Indications:    CVA  History:        Patient has no prior history of Echocardiogram examinations.                 TIA; Signs/Symptoms:Shortness of Breath.  Sonographer:    Luisa Hart RDCS Referring Phys: Wayne  1. Left ventricular ejection fraction, by estimation, is 55 to 60%. The left ventricle has normal function. The left ventricle has no regional wall motion abnormalities. There is mild left ventricular hypertrophy. Left ventricular diastolic parameters are consistent with Grade I diastolic dysfunction (impaired relaxation).  2. Right ventricular systolic function is normal. The right ventricular size is normal. There is normal pulmonary artery systolic pressure. The estimated right ventricular systolic pressure is 0000000 mmHg.  3. The mitral valve is grossly normal. No evidence of mitral valve regurgitation.  4. The aortic  valve is tricuspid. Aortic valve regurgitation is trivial. Mild to moderate aortic valve sclerosis/calcification is present, without any evidence of aortic stenosis. Aortic valve mean gradient measures 5.0 mmHg.  5. The inferior vena cava is normal in size with greater than 50% respiratory variability, suggesting right atrial pressure of 3 mmHg. Comparison(s): No prior Echocardiogram. FINDINGS  Left Ventricle: Left ventricular ejection fraction, by estimation, is 55 to 60%. The left ventricle has normal function. The left ventricle has no regional wall motion abnormalities. The left ventricular internal cavity size was normal in size. There is  mild left ventricular hypertrophy. Left ventricular diastolic parameters are consistent with Grade I diastolic dysfunction (impaired relaxation). Indeterminate filling pressures. Right Ventricle: The right  ventricular size is normal. No increase in right ventricular wall thickness. Right ventricular systolic function is normal. There is normal pulmonary artery systolic pressure. The tricuspid regurgitant velocity is 2.55 m/s, and  with an assumed right atrial pressure of 3 mmHg, the estimated right ventricular systolic pressure is 0000000 mmHg. Left Atrium: Left atrial size was normal in size. Right Atrium: Right atrial size was normal in size. Pericardium: There is no evidence of pericardial effusion. Mitral Valve: The mitral valve is grossly normal. No evidence of mitral valve regurgitation. Tricuspid Valve: The tricuspid valve is grossly normal. Tricuspid valve regurgitation is trivial. Aortic Valve: The aortic valve is tricuspid. Aortic valve regurgitation is trivial. Mild to moderate aortic valve sclerosis/calcification is present, without any evidence of aortic stenosis. Aortic valve mean gradient measures 5.0 mmHg. Aortic valve peak  gradient measures 9.0 mmHg. Aortic valve area, by VTI measures 2.00 cm. Pulmonic Valve: The pulmonic valve was grossly normal. Pulmonic  valve regurgitation is trivial. Aorta: The aortic root and ascending aorta are structurally normal, with no evidence of dilitation. Venous: The inferior vena cava is normal in size with greater than 50% respiratory variability, suggesting right atrial pressure of 3 mmHg. IAS/Shunts: The interatrial septum was not well visualized.  LEFT VENTRICLE PLAX 2D LVIDd:         3.70 cm     Diastology LVIDs:         2.00 cm     LV e' medial:    4.13 cm/s LV PW:         1.20 cm     LV E/e' medial:  15.9 LV IVS:        1.20 cm     LV e' lateral:   7.51 cm/s LVOT diam:     2.30 cm     LV E/e' lateral: 8.7 LV SV:         43 LV SV Index:   26 LVOT Area:     4.15 cm  LV Volumes (MOD) LV vol d, MOD A2C: 43.0 ml LV vol d, MOD A4C: 47.1 ml LV vol s, MOD A2C: 31.0 ml LV vol s, MOD A4C: 20.6 ml LV SV MOD A2C:     12.0 ml LV SV MOD A4C:     47.1 ml LV SV MOD BP:      20.6 ml RIGHT VENTRICLE RV Basal diam:  3.00 cm RV Mid diam:    1.80 cm RV S prime:     12.60 cm/s TAPSE (M-mode): 1.9 cm LEFT ATRIUM             Index       RIGHT ATRIUM           Index LA diam:        3.10 cm 1.90 cm/m  RA Area:     17.30 cm LA Vol (A2C):   10.8 ml 6.62 ml/m  RA Volume:   48.70 ml  29.85 ml/m LA Vol (A4C):   26.8 ml 16.42 ml/m LA Biplane Vol: 17.3 ml 10.60 ml/m  AORTIC VALVE                    PULMONIC VALVE AV Area (Vmax):    1.93 cm     PV Vmax:          0.92 m/s AV Area (Vmean):   1.85 cm     PV Vmean:         60.300 cm/s AV Area (VTI):  2.00 cm     PV VTI:           0.179 m AV Vmax:           150.00 cm/s  PV Peak grad:     3.4 mmHg AV Vmean:          101.000 cm/s PV Mean grad:     2.0 mmHg AV VTI:            0.216 m      PR End Diast Vel: 6.97 msec AV Peak Grad:      9.0 mmHg AV Mean Grad:      5.0 mmHg LVOT Vmax:         69.50 cm/s LVOT Vmean:        44.900 cm/s LVOT VTI:          0.104 m LVOT/AV VTI ratio: 0.48 AR Vena Contracta: 0.20 cm  AORTA Ao Root diam: 3.70 cm Ao Asc diam:  3.20 cm MITRAL VALVE                TRICUSPID VALVE MV Area  (PHT): 4.17 cm     TR Peak grad:   26.0 mmHg MV Decel Time: 182 msec     TR Vmax:        255.00 cm/s MV E velocity: 65.60 cm/s MV A velocity: 126.00 cm/s  SHUNTS MV E/A ratio:  0.52         Systemic VTI:  0.10 m                             Systemic Diam: 2.30 cm Lyman Bishop MD Electronically signed by Lyman Bishop MD Signature Date/Time: 08/17/2020/2:40:16 PM    Final         Scheduled Meds:  amLODipine  2.5 mg Oral QPM   aspirin EC  325 mg Oral Q breakfast   clopidogrel  75 mg Oral Daily   docusate sodium  100 mg Oral BID   levothyroxine  125 mcg Oral QAC breakfast   sodium chloride flush  3 mL Intravenous Q12H   vitamin B-12  100 mcg Oral Daily   Continuous Infusions:  methocarbamol (ROBAXIN) IV 500 mg (08/13/20 1014)     LOS: 4 days    Time spent: 45 minutes  Georgette Shell, MD  08/17/2020, 3:41 PM

## 2020-08-17 NOTE — TOC Progression Note (Signed)
Transition of Care [facility]) - Progression Note    Patient Details  Name: Tanya [name] MRN: [id number] Date of Birth: [birth year]  Transition of Care [facility]) CM/SW Contact  Purcell Mouton, RN Phone Number: 08/17/2020, 9:23 AM  Clinical Narrative:    Insurance auth [id number] for Tanya [name] for PTAR [id number].    Expected Discharge Plan: [place] Barriers to Discharge: Continued Medical Work up, SNF Pending bed offer, Ship broker  Expected Discharge Plan and Services Expected Discharge Plan: [place] In-house Referral: Clinical Social Work   Post Acute Care Choice: [place] Living arrangements for the past 2 months: Single Family Home Expected Discharge Date: 08/15/20               DME Arranged: N/A DME Agency: NA                   Social Determinants of Health (SDOH) Interventions    Readmission Risk Interventions No flowsheet data found.

## 2020-08-18 DIAGNOSIS — M6281 Muscle weakness (generalized): Secondary | ICD-10-CM | POA: Diagnosis not present

## 2020-08-18 DIAGNOSIS — K5901 Slow transit constipation: Secondary | ICD-10-CM | POA: Diagnosis not present

## 2020-08-18 DIAGNOSIS — Z7401 Bed confinement status: Secondary | ICD-10-CM | POA: Diagnosis not present

## 2020-08-18 DIAGNOSIS — J9601 Acute respiratory failure with hypoxia: Secondary | ICD-10-CM | POA: Diagnosis not present

## 2020-08-18 DIAGNOSIS — R2681 Unsteadiness on feet: Secondary | ICD-10-CM | POA: Diagnosis not present

## 2020-08-18 DIAGNOSIS — S79929A Unspecified injury of unspecified thigh, initial encounter: Secondary | ICD-10-CM | POA: Diagnosis not present

## 2020-08-18 DIAGNOSIS — Z4789 Encounter for other orthopedic aftercare: Secondary | ICD-10-CM | POA: Diagnosis not present

## 2020-08-18 DIAGNOSIS — R471 Dysarthria and anarthria: Secondary | ICD-10-CM | POA: Diagnosis not present

## 2020-08-18 DIAGNOSIS — R197 Diarrhea, unspecified: Secondary | ICD-10-CM | POA: Diagnosis not present

## 2020-08-18 DIAGNOSIS — Z8673 Personal history of transient ischemic attack (TIA), and cerebral infarction without residual deficits: Secondary | ICD-10-CM | POA: Diagnosis not present

## 2020-08-18 DIAGNOSIS — R2689 Other abnormalities of gait and mobility: Secondary | ICD-10-CM | POA: Diagnosis not present

## 2020-08-18 DIAGNOSIS — I10 Essential (primary) hypertension: Secondary | ICD-10-CM | POA: Diagnosis not present

## 2020-08-18 DIAGNOSIS — I959 Hypotension, unspecified: Secondary | ICD-10-CM | POA: Diagnosis not present

## 2020-08-18 DIAGNOSIS — R262 Difficulty in walking, not elsewhere classified: Secondary | ICD-10-CM | POA: Diagnosis not present

## 2020-08-18 DIAGNOSIS — S72141D Displaced intertrochanteric fracture of right femur, subsequent encounter for closed fracture with routine healing: Secondary | ICD-10-CM | POA: Diagnosis not present

## 2020-08-18 DIAGNOSIS — S72141S Displaced intertrochanteric fracture of right femur, sequela: Secondary | ICD-10-CM | POA: Diagnosis not present

## 2020-08-18 DIAGNOSIS — R21 Rash and other nonspecific skin eruption: Secondary | ICD-10-CM | POA: Diagnosis not present

## 2020-08-18 DIAGNOSIS — D62 Acute posthemorrhagic anemia: Secondary | ICD-10-CM | POA: Diagnosis not present

## 2020-08-18 DIAGNOSIS — E039 Hypothyroidism, unspecified: Secondary | ICD-10-CM | POA: Diagnosis not present

## 2020-08-18 DIAGNOSIS — R41841 Cognitive communication deficit: Secondary | ICD-10-CM | POA: Diagnosis not present

## 2020-08-18 DIAGNOSIS — R5381 Other malaise: Secondary | ICD-10-CM | POA: Diagnosis not present

## 2020-08-18 DIAGNOSIS — S72144A Nondisplaced intertrochanteric fracture of right femur, initial encounter for closed fracture: Secondary | ICD-10-CM | POA: Diagnosis not present

## 2020-08-18 DIAGNOSIS — S72144S Nondisplaced intertrochanteric fracture of right femur, sequela: Secondary | ICD-10-CM | POA: Diagnosis not present

## 2020-08-18 NOTE — TOC Progression Note (Signed)
Transition of Care [facility]) - Progression Note    Patient Details  Name: AKUA GRUBICH MRN: [id number] Date of Birth: Aug 21, 1925  [facility]) CM/SW Contact  Purcell Mouton, RN Phone Number: 08/18/2020, 12:25 PM  Clinical Narrative:    Spoke with pt's son Barnabas Lister to inform him that pt would transfer to [place] today.    Expected Discharge Plan: [place] Barriers to Discharge: Barriers Resolved  Expected Discharge Plan and Services Expected Discharge Plan: [place] In-house Referral: Clinical Social Work   Post Acute Care Choice: [place] Living arrangements for the past 2 months: [place] Expected Discharge Date: 08/18/20               DME Arranged: N/A DME Agency: NA                   Social Determinants of Health (SDOH) Interventions    Readmission Risk Interventions No flowsheet data found.

## 2020-08-18 NOTE — Care Management Important Message (Signed)
Important Message  Patient Details IM Letter placed in Patient's room. Name: Tanya [name] MRN: [id number] Date of Birth: December 14, 1925   Medicare Important Message Given:  Yes     Kerin Salen 08/18/2020, 9:26 AM

## 2020-08-18 NOTE — Progress Notes (Signed)
Physical Therapy Treatment Patient Details Name: Tanya [name] MRN: [id number] DOB: [birth year] Today's Date: 08/18/2020    History of Present Illness 87 year old female with PMH TIA, vitamin B12 deficiency, mild cognitive impairment who presented to the ER after mechanical fall at home. X-rays showed  intertrochanteric right femur fracture with slight varus angulation.  She was transferred to [facility] for orthopedic surgery evaluation. pt is now s/p IM nail RLE. Patient with R facial droop 8/9, code stroke initiated. MRI was negative for acute findings    PT Comments    Pt assisted with ambulating in hallway.  Pt reported dizziness so recliner brought to pt and BP reading lower (see mobility section below). RN present and aware.  Pt reported feeling better upon sitting in recliner.  Pt anticipates d/c to SNF today.    Follow Up Recommendations  SNF     Equipment Recommendations  None recommended by PT    Recommendations for Other Services       Precautions / Restrictions Precautions Precautions: Fall Precaution Comments: HOH, use right ear Restrictions Weight Bearing Restrictions: Yes RLE Weight Bearing: Weight bearing as tolerated    Mobility  Bed Mobility Overal bed mobility: Needs Assistance Bed Mobility: Supine to Sit     Supine to sit: Mod assist;HOB elevated     General bed mobility comments: assist for Rt LE and scooting to EOB    Transfers Overall transfer level: Needs assistance Equipment used: Rolling walker (2 wheeled) Transfers: Sit to/from Stand Sit to Stand: Min assist         General transfer comment: verbal cues for UE and LE positioning; assist to rise and steady as well as control descent  Ambulation/Gait Ambulation/Gait assistance: Min assist Gait Distance (Feet): 30 Feet Assistive device: Rolling walker (2 wheeled) Gait Pattern/deviations: Step-to pattern;Decreased stance time - right;Antalgic Gait velocity: decr   General Gait  Details: verbal cues for sequence, RW positioning, step length, posture,  Pt reports dizziness upon returning to room and stopped talking with therapist (pt HOH though) and eyes remained open so had staff obtain recliner and pt assisted to sitting for safety; BP 96/49 mmHg upon sitting in recliner and SPO2 100% on room air (reapplied 2L O2  upon return to recliner); BP improved to 114/49 mmHg with a few minutes reclined.  RN present and aware of events   Stairs             Wheelchair Mobility    Modified Rankin (Stroke Patients Only)       Balance                                            Cognition Arousal/Alertness: Awake/alert Behavior During Therapy: WFL for tasks assessed/performed Overall Cognitive Status: History of cognitive impairments - at baseline                                 General Comments: "mild cognitive impairment" per hx      Exercises      General Comments        Pertinent Vitals/Pain Pain Assessment: Faces Faces Pain Scale: Hurts little more Pain Location: R hip Pain Descriptors / Indicators: Grimacing;Sore Pain Intervention(s): Repositioned;Monitored during session    Home Living  Prior Function            PT Goals (current goals can now be found in the care plan section) Progress towards PT goals: Progressing toward goals    Frequency    Min 3X/week      PT Plan Current plan remains appropriate    Co-evaluation              AM-PAC PT "6 Clicks" Mobility   Outcome Measure  Help needed turning from your back to your side while in a flat bed without using bedrails?: A Lot Help needed moving from lying on your back to sitting on the side of a flat bed without using bedrails?: A Lot Help needed moving to and from a bed to a chair (including a wheelchair)?: A Lot Help needed standing up from a chair using your arms (e.g., wheelchair or bedside chair)?: A  Lot Help needed to walk in hospital room?: A Lot Help needed climbing 3-5 steps with a railing? : Total 6 Click Score: 11    End of Session Equipment Utilized During Treatment: Gait belt Activity Tolerance: Patient tolerated treatment well Patient left: in chair;with call bell/phone within reach;with chair alarm set Nurse Communication: Mobility status PT Visit Diagnosis: Other abnormalities of gait and mobility (R26.89);Difficulty in walking, not elsewhere classified (R26.2)     Time: [id number] PT Time Calculation (min) (ACUTE ONLY): 21 min  Charges:  $Gait Training: 8-22 mins                     Jannette Spanner PT, DPT Acute Rehabilitation Services Pager: [phone] Office: [phone]    York Ram E 08/18/2020, 12:24 PM

## 2020-08-18 NOTE — Plan of Care (Signed)
°  Problem: Elimination: °Goal: Will not experience complications related to bowel motility °Outcome: Progressing °  °Problem: Pain Managment: °Goal: General experience of comfort will improve °Outcome: Progressing °  °

## 2020-08-21 ENCOUNTER — Encounter: Payer: Self-pay | Admitting: Adult Health

## 2020-08-21 ENCOUNTER — Non-Acute Institutional Stay (SKILLED_NURSING_FACILITY): Payer: PPO | Admitting: Adult Health

## 2020-08-21 DIAGNOSIS — S72144S Nondisplaced intertrochanteric fracture of right femur, sequela: Secondary | ICD-10-CM

## 2020-08-21 DIAGNOSIS — I10 Essential (primary) hypertension: Secondary | ICD-10-CM

## 2020-08-21 DIAGNOSIS — Z8673 Personal history of transient ischemic attack (TIA), and cerebral infarction without residual deficits: Secondary | ICD-10-CM

## 2020-08-21 DIAGNOSIS — K5901 Slow transit constipation: Secondary | ICD-10-CM | POA: Diagnosis not present

## 2020-08-21 DIAGNOSIS — E039 Hypothyroidism, unspecified: Secondary | ICD-10-CM | POA: Diagnosis not present

## 2020-08-21 NOTE — Progress Notes (Addendum)
Location:  [facility] Room Number: [id number] Place of Service:  SNF (31) Provider:  Durenda Age, DNP, FNP-BC  Patient Care Team: [name], MD as PCP - General (Internal Medicine)  Extended Emergency Contact Information Primary Emergency Contact: [name] Address: [place] Phone: [phone] Mobile Phone: [phone] Relation: None  Code Status:  DNR  Goals of care: Advanced Directive information Advanced Directives 08/25/2020  Does Patient Have a Medical Advance Directive? Yes  Type of Advance Directive Out of facility DNR (pink MOST or yellow form)  Does patient want to make changes to medical advance directive? No - Patient declined     Chief Complaint  Patient presents with   Hospitalization Follow-up    HPI:  Pt is a 87 y.o. female who was admitted to [facility] on  08/18/20 post hospital admission 08/12/20 to 08/18/2020. She has a PMH of TIA, vitamin B12 deficiency and mild cognitive impairment.  She had a fall at home sustaining intratrochanteric right femur fracture with slight varus angulation.  After orthopedic surgery evaluation, she underwent IM nail fixation on 08/13/2020.  She has been recovering as expected until 08/15/20 when she developed acute onset mild right facial droop and dysarthria.  A code stroke was initiated and was evaluated by tele neurology.  CT head does not show any acute infarct.  MRI of the brain does not show any acute infarct.  CT angiogram of the head and neck does show some blockages in the vertebral and carotid vessels.  Discussed with neurology and will continue aspirin and was started on Plavix.  She  was seen in the room today. She complained of constipation.     Past Medical History:  Diagnosis Date   Hearing loss    Hypothyroidism    Neutropenia ([place])    Osteopenia    Primary osteoarthritis    Tremor    Past  Surgical History:  Procedure Laterality Date   INTRAMEDULLARY (IM) NAIL INTERTROCHANTERIC Right 08/13/2020   Procedure: INTRAMEDULLARY (IM) NAIL INTERTROCHANTRIC;  Surgeon: Gaynelle Arabian, MD;  Location: [facility];  Service: Orthopedics;  Laterality: Right;    Allergies  Allergen Reactions   Codeine Other (See Comments)    Extreme headaches   Shellfish Allergy Hives   Shellfish-Derived Products Hives   Amoxicillin Itching and Rash    Outpatient Encounter Medications as of 08/21/2020  Medication Sig   amLODipine (NORVASC) 2.5 MG tablet Take 2.5 mg by mouth every evening.   Apoaequorin (PREVAGEN) 10 MG CAPS Take 1 capsule by mouth every morning.   aspirin EC 325 MG EC tablet Take 1 tablet (325 mg total) by mouth daily with breakfast for 18 days. Then resume one 81 mg aspirin once a day.   bisacodyl (DULCOLAX) 10 MG suppository Place 10 mg rectally as needed for moderate constipation.   Cholecalciferol (VITAMIN D3) 50 MCG (2000 UT) TABS Take 2,000 Units by mouth daily.   clopidogrel (PLAVIX) 75 MG tablet Take 1 tablet (75 mg total) by mouth daily.   docusate sodium (COLACE) 100 MG capsule Take 1 capsule (100 mg total) by mouth 2 (two) times daily.   HYDROcodone-acetaminophen (NORCO/VICODIN) 5-325 MG tablet Take 1 tablet by mouth every 6 (six) hours as needed for moderate pain or severe pain (pain score 4-6).   levothyroxine (SYNTHROID) 125 MCG tablet Take 1 tablet (125 mcg total) by mouth daily before breakfast.  Magnesium Hydroxide (MILK OF MAGNESIA PO) Take 30 mLs by mouth as needed.   magnesium oxide (MAG-OX) 400 MG tablet Take 400 mg by mouth daily.   OXYGEN Inhale into the lungs.   Sodium Phosphates (RA SALINE ENEMA RE) Place 1 Dose rectally as needed.   vitamin B-12 (CYANOCOBALAMIN) 100 MCG tablet Take 100 mcg by mouth daily.   [DISCONTINUED] methocarbamol (ROBAXIN) 500 MG tablet Take 1 tablet (500 mg total) by mouth every 6 (six) hours as needed for muscle spasms.   aspirin EC 81 MG  tablet Take 81 mg by mouth daily. Swallow whole.   No facility-administered encounter medications on file as of 08/21/2020.    Review of Systems  GENERAL: No change in appetite, no fatigue, no weight changes, no fever or chills  MOUTH and THROAT: Denies oral discomfort, gingival pain or bleeding RESPIRATORY: no cough, SOB, DOE, wheezing, hemoptysis CARDIAC: No chest pain, edema or palpitations GI: + Constipation GU: Denies dysuria, frequency, hematuria or discharge NEUROLOGICAL: Denies dizziness, syncope, numbness, or headache PSYCHIATRIC: Denies feelings of depression or anxiety. No report of hallucinations, insomnia, paranoia, or agitation   Immunization History  Administered Date(s) Administered   Influenza, High Dose Seasonal PF 10/08/2013, 10/13/2014, 10/12/2015   Influenza, Quadrivalent, Recombinant, Inj, Pf 11/06/2016, 11/25/2017   PFIZER(Purple Top)SARS-COV-2 Vaccination 02/17/2019, 03/10/2019   Pneumococcal Conjugate-13 12/02/2012   Pneumococcal Polysaccharide-23 05/21/2010   Tdap 07/11/2016   Pertinent  Health Maintenance Due  Topic Date Due   DEXA SCAN  Never done   INFLUENZA VACCINE  08/07/2020   PNA vac Low Risk Adult  Completed   Fall Risk  08/07/2018  Falls in the past year? 0  Comment Emmi Telephone Survey: data to providers prior to load     Vitals:   08/21/20 1032  BP: 131/65  Pulse: 83  Resp: 20  Temp: 97.7 F (36.5 C)  Weight: 122 lb 3.2 oz (55.4 kg)  Height: '5\' 2"'$  (1.575 m)   Body mass index is 22.35 kg/m.  Physical Exam  GENERAL APPEARANCE: Well nourished. In no acute distress. Normal body habitus SKIN:  right thigh and hip surgical wound dry and no erythema MOUTH and THROAT: Lips are without lesions. Oral mucosa is moist and without lesions.  RESPIRATORY: Breathing is even & unlabored, BS CTAB CARDIAC: RRR, no murmur,no extra heart sounds, no edema GI: Abdomen soft, normal BS, no masses, no tenderness NEUROLOGICAL: There is no tremor.  Speech is clear. Alert to self and place, disoriented to time. PSYCHIATRIC:  Affect and behavior are appropriate  Labs reviewed: Recent Labs    08/15/20 0331 08/16/20 0358 08/17/20 0348  NA 131* 135 134*  K 3.9 3.6 3.7  CL 97* 101 100  CO2 28 32 28  GLUCOSE 94 90 99  BUN '13 8 12  '$ CREATININE 0.58 0.49 0.53  CALCIUM 8.1* 8.3* 8.3*  MG 1.9 2.0 2.1   No results for input(s): AST, ALT, ALKPHOS, BILITOT, PROT, ALBUMIN in the last 8760 hours. Recent Labs    08/15/20 0331 08/16/20 0358 08/17/20 0348  WBC 4.1 3.8* 4.4  NEUTROABS 1.6* 1.2* 1.7  HGB 8.1* 8.1* 8.0*  HCT 25.0* 25.1* 25.4*  MCV 87.7 88.1 88.8  PLT 222 244 301   Lab Results  Component Value Date   TSH 24.653 (H) 08/13/2020   Lab Results  Component Value Date   HGBA1C 5.8 (H) 08/15/2020   Lab Results  Component Value Date   CHOL 132 08/15/2020   HDL 50 08/15/2020  LDLCALC 69 08/15/2020   TRIG 64 08/15/2020   CHOLHDL 2.6 08/15/2020    Significant Diagnostic Results in last 30 days:  CT ANGIO HEAD W OR WO CONTRAST  Result Date: 08/15/2020 CLINICAL DATA:  Neuro deficit, acute, stroke suspected facial droop, slurred speech; Neuro deficit, acute, stroke suspected Facial droop, slurred speech EXAM: CT ANGIOGRAPHY HEAD AND NECK TECHNIQUE: Multidetector CT imaging of the head and neck was performed using the standard protocol during bolus administration of intravenous contrast. Multiplanar CT image reconstructions and MIPs were obtained to evaluate the vascular anatomy. Carotid stenosis measurements (when applicable) are obtained utilizing NASCET criteria, using the distal internal carotid diameter as the denominator. CONTRAST:  61m OMNIPAQUE IOHEXOL 350 MG/ML SOLN COMPARISON:  None. FINDINGS: CTA NECK FINDINGS Aortic arch: Atherosclerosis of the aorta and the great vessels. Great vessel origins are patent. Right carotid system: Patent. No significant (greater than 50%) stenosis. Moderate calcific and noncalcific  atherosclerosis at the carotid bifurcation. Tortuous common and internal carotid arteries. Left carotid system: Patent. Moderate predominantly calcific atherosclerosis at the carotid bifurcation without greater than 50% stenosis. Tortuous common and internal carotid arteries. Vertebral arteries: Patent. Moderate stenosis of the left vertebral artery origin. Mildly left dominant. Tortuous bilaterally. Skeleton: Severe degenerative disc disease at C6-C7 where there is marked disc height loss, endplate sclerosis and posterior disc osteophyte complex. Other neck: No acute abnormality. Upper chest: Motion limited evaluation without consolidation in the visualized lung apices. Review of the MIP images confirms the above findings CTA HEAD FINDINGS Anterior circulation: Moderate calcific atherosclerosis of bilateral intracranial ICAs. Moderate right and mild left paraclinoid ICA stenosis. Bilateral M1 and proximal M2 MCA branches are patent without proximal hemodynamically significant stenosis. Bilateral ACAs are patent. 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 Posterior circulation: Bilateral intradural vertebral arteries, basilar artery, and posterior cerebral arteries are patent without proximal hemodynamically significant stenosis. No aneurysm identified. Venous sinuses: As permitted by contrast timing, patent. Review of the MIP images confirms the above findings IMPRESSION: CTA head: 1. No large vessel occlusion. 2. Moderate right and mild left paraclinoid ICA stenosis. 3. 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 CTA neck: 1. Moderate stenosis of the left vertebral artery origin. 2. Moderate bilateral carotid bifurcation atherosclerosis without greater than 50% stenosis. Electronically Signed   By: FMargaretha SheffieldMD   On: 08/15/2020 19:49   DG Abd 1 View  Result Date: 08/17/2020 CLINICAL DATA:  Nausea and vomiting. EXAM: ABDOMEN - 1 VIEW COMPARISON:  None. FINDINGS:  Bowel gas pattern is within normal limits. There does not appear to be an abnormal amount of fecal matter. No abnormal soft tissue calcifications other than atherosclerotic vascular calcification. Previous ORIF for basicervical right femoral neck fracture. Curvature and chronic degenerative change of the spine. IMPRESSION: Gas pattern within normal limits. There does not appear to be an abnormal amount of fecal matter. Electronically Signed   By: MNelson ChimesM.D.   On: 08/17/2020 16:26   CT ANGIO NECK W OR WO CONTRAST  Result Date: 08/15/2020 CLINICAL DATA:  Neuro deficit, acute, stroke suspected facial droop, slurred speech; Neuro deficit, acute, stroke suspected Facial droop, slurred speech EXAM: CT ANGIOGRAPHY HEAD AND NECK TECHNIQUE: Multidetector CT imaging of the head and neck was performed using the standard protocol during bolus administration of intravenous contrast. Multiplanar CT image reconstructions and MIPs were obtained to evaluate the vascular anatomy. Carotid stenosis measurements (when applicable) are obtained utilizing NASCET criteria, using the distal internal carotid diameter as  the denominator. CONTRAST:  58m OMNIPAQUE IOHEXOL 350 MG/ML SOLN COMPARISON:  None. FINDINGS: CTA NECK FINDINGS Aortic arch: Atherosclerosis of the aorta and the great vessels. Great vessel origins are patent. Right carotid system: Patent. No significant (greater than 50%) stenosis. Moderate calcific and noncalcific atherosclerosis at the carotid bifurcation. Tortuous common and internal carotid arteries. Left carotid system: Patent. Moderate predominantly calcific atherosclerosis at the carotid bifurcation without greater than 50% stenosis. Tortuous common and internal carotid arteries. Vertebral arteries: Patent. Moderate stenosis of the left vertebral artery origin. Mildly left dominant. Tortuous bilaterally. Skeleton: Severe degenerative disc disease at C6-C7 where there is marked disc height loss, endplate  sclerosis and posterior disc osteophyte complex. Other neck: No acute abnormality. Upper chest: Motion limited evaluation without consolidation in the visualized lung apices. Review of the MIP images confirms the above findings CTA HEAD FINDINGS Anterior circulation: Moderate calcific atherosclerosis of bilateral intracranial ICAs. Moderate right and mild left paraclinoid ICA stenosis. Bilateral M1 and proximal M2 MCA branches are patent without proximal hemodynamically significant stenosis. Bilateral ACAs are patent. 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 Posterior circulation: Bilateral intradural vertebral arteries, basilar artery, and posterior cerebral arteries are patent without proximal hemodynamically significant stenosis. No aneurysm identified. Venous sinuses: As permitted by contrast timing, patent. Review of the MIP images confirms the above findings IMPRESSION: CTA head: 1. No large vessel occlusion. 2. Moderate right and mild left paraclinoid ICA stenosis. 3. 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 CTA neck: 1. Moderate stenosis of the left vertebral artery origin. 2. Moderate bilateral carotid bifurcation atherosclerosis without greater than 50% stenosis. Electronically Signed   By: FMargaretha SheffieldMD   On: 08/15/2020 19:49   MR BRAIN WO CONTRAST  Result Date: 08/16/2020 CLINICAL DATA:  Neuro deficit, acute, stroke suspected; facial droop, slurred speech. Additional history provided: Status post hip surgery August 7th, developed acute onset mild right facial droop and dysarthria 08/15/2020. Evaluate for possible CVA. EXAM: MRI HEAD WITHOUT CONTRAST TECHNIQUE: Multiplanar, multiecho pulse sequences of the brain and surrounding structures were obtained without intravenous contrast. COMPARISON:  Noncontrast head CT and CT angiogram head/neck 08/15/2020. MRI brain 07/02/2019. FINDINGS: Brain: Generalized cerebral and cerebellar atrophy. As before,  there is lateral and third ventriculomegaly which appears out proportion to the degree of generalized cerebral volume loss. 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 Advanced patchy and confluent T2/FLAIR hyperintensity within the cerebral white matter, nonspecific but compatible with chronic small vessel ischemic disease. There is no acute infarct. No evidence of an intracranial mass. No chronic intracranial blood products. No extra-axial fluid collection. No midline shift. Vascular: Maintained proximal large arterial flow voids. Skull and upper cervical spine: Focal suspicious marrow lesion. Sinuses/Orbits: Visualized orbits show no acute finding. Leftward gaze no significant paranasal sinus disease. IMPRESSION: No evidence of acute intracranial abnormality. As before, there is lateral and third ventriculomegaly which appears out of proportion to the degree of generalized cerebral volume loss. These findings suggest normal pressure hydrocephalus. Advanced chronic small vessel ischemic changes within the cerebral white matter. Electronically Signed   By: KKellie SimmeringDO   On: 08/16/2020 07:53   DG C-Arm 1-60 Min-No Report  Result Date: 08/13/2020 Fluoroscopy was utilized by the requesting physician.  No radiographic interpretation.   ECHOCARDIOGRAM COMPLETE  Result Date: 08/17/2020    ECHOCARDIOGRAM REPORT   Patient Name:   Tanya [name] Date of Exam: 08/17/2020 Medical Rec #:  [id number]     Height:  62.0 in Accession #:    [id number]     Weight:       137.8 lb Date of Birth:  1925/06/09      BSA:          1.632 m Patient Age:    10 years       BP:           144/56 mmHg Patient Gender: F              HR:           88 bpm. Exam Location:  Inpatient Procedure: 2D Echo, Cardiac Doppler and Color Doppler Indications:    CVA  History:        Patient has no prior history of Echocardiogram examinations.                 TIA; Signs/Symptoms:Shortness of Breath.  Sonographer:    Luisa Hart RDCS Referring Phys: [facility]  1. Left ventricular ejection fraction, by estimation, is 55 to 60%. The left ventricle has normal function. The left ventricle has no regional wall motion abnormalities. There is mild left ventricular hypertrophy. Left ventricular diastolic parameters are consistent with Grade I diastolic dysfunction (impaired relaxation).  2. Right ventricular systolic function is normal. The right ventricular size is normal. There is normal pulmonary artery systolic pressure. The estimated right ventricular systolic pressure is 0000000 mmHg.  3. The mitral valve is grossly normal. No evidence of mitral valve regurgitation.  4. The aortic valve is tricuspid. Aortic valve regurgitation is trivial. Mild to moderate aortic valve sclerosis/calcification is present, without any evidence of aortic stenosis. Aortic valve mean gradient measures 5.0 mmHg.  5. The inferior vena cava is normal in size with greater than 50% respiratory variability, suggesting right atrial pressure of 3 mmHg. Comparison(s): No prior Echocardiogram. FINDINGS  Left Ventricle: Left ventricular ejection fraction, by estimation, is 55 to 60%. The left ventricle has normal function. The left ventricle has no regional wall motion abnormalities. The left ventricular internal cavity size was normal in size. There is  mild left ventricular hypertrophy. Left ventricular diastolic parameters are consistent with Grade I diastolic dysfunction (impaired relaxation). Indeterminate filling pressures. Right Ventricle: The right ventricular size is normal. No increase in right ventricular wall thickness. Right ventricular systolic function is normal. There is normal pulmonary artery systolic pressure. The tricuspid regurgitant velocity is 2.55 m/s, and  with an assumed right atrial pressure of 3 mmHg, the estimated right ventricular systolic pressure is 0000000 mmHg. Left Atrium: Left atrial size was normal in size. Right  Atrium: Right atrial size was normal in size. Pericardium: There is no evidence of pericardial effusion. Mitral Valve: The mitral valve is grossly normal. No evidence of mitral valve regurgitation. Tricuspid Valve: The tricuspid valve is grossly normal. Tricuspid valve regurgitation is trivial. Aortic Valve: The aortic valve is tricuspid. Aortic valve regurgitation is trivial. Mild to moderate aortic valve sclerosis/calcification is present, without any evidence of aortic stenosis. Aortic valve mean gradient measures 5.0 mmHg. Aortic valve peak  gradient measures 9.0 mmHg. Aortic valve area, by VTI measures 2.00 cm. Pulmonic Valve: The pulmonic valve was grossly normal. Pulmonic valve regurgitation is trivial. Aorta: The aortic root and ascending aorta are structurally normal, with no evidence of dilitation. Venous: The inferior vena cava is normal in size with greater than 50% respiratory variability, suggesting right atrial pressure of 3 mmHg. IAS/Shunts: The interatrial septum was not well visualized.  LEFT VENTRICLE PLAX 2D  LVIDd:         3.70 cm     Diastology LVIDs:         2.00 cm     LV e' medial:    4.13 cm/s LV PW:         1.20 cm     LV E/e' medial:  15.9 LV IVS:        1.20 cm     LV e' lateral:   7.51 cm/s LVOT diam:     2.30 cm     LV E/e' lateral: 8.7 LV SV:         43 LV SV Index:   26 LVOT Area:     4.15 cm  LV Volumes (MOD) LV vol d, MOD A2C: 43.0 ml LV vol d, MOD A4C: 47.1 ml LV vol s, MOD A2C: 31.0 ml LV vol s, MOD A4C: 20.6 ml LV SV MOD A2C:     12.0 ml LV SV MOD A4C:     47.1 ml LV SV MOD BP:      20.6 ml RIGHT VENTRICLE RV Basal diam:  3.00 cm RV Mid diam:    1.80 cm RV S prime:     12.60 cm/s TAPSE (M-mode): 1.9 cm LEFT ATRIUM             Index       RIGHT ATRIUM           Index LA diam:        3.10 cm 1.90 cm/m  RA Area:     17.30 cm LA Vol (A2C):   10.8 ml 6.62 ml/m  RA Volume:   48.70 ml  29.85 ml/m LA Vol (A4C):   26.8 ml 16.42 ml/m LA Biplane Vol: 17.3 ml 10.60 ml/m  AORTIC VALVE                     PULMONIC VALVE AV Area (Vmax):    1.93 cm     PV Vmax:          0.92 m/s AV Area (Vmean):   1.85 cm     PV Vmean:         60.300 cm/s AV Area (VTI):     2.00 cm     PV VTI:           0.179 m AV Vmax:           150.00 cm/s  PV Peak grad:     3.4 mmHg AV Vmean:          101.000 cm/s PV Mean grad:     2.0 mmHg AV VTI:            0.216 m      PR End Diast Vel: 6.97 msec AV Peak Grad:      9.0 mmHg AV Mean Grad:      5.0 mmHg LVOT Vmax:         69.50 cm/s LVOT Vmean:        44.900 cm/s LVOT VTI:          0.104 m LVOT/AV VTI ratio: 0.48 AR Vena Contracta: 0.20 cm  AORTA Ao Root diam: 3.70 cm Ao Asc diam:  3.20 cm MITRAL VALVE                TRICUSPID VALVE MV Area (PHT): 4.17 cm     TR Peak grad:   26.0 mmHg MV Decel Time: 182 msec  TR Vmax:        255.00 cm/s MV E velocity: 65.60 cm/s MV A velocity: 126.00 cm/s  SHUNTS MV E/A ratio:  0.52         Systemic VTI:  0.10 m                             Systemic Diam: 2.30 cm Lyman Bishop MD Electronically signed by Lyman Bishop MD Signature Date/Time: 08/17/2020/2:40:16 PM    Final    DG HIP OPERATIVE UNILAT W OR W/O PELVIS RIGHT  Result Date: 08/13/2020 CLINICAL DATA:  Surgery, elective Z41.9 (ICD-10-CM) EXAM: OPERATIVE RIGHT HIP (WITH PELVIS IF PERFORMED) 3 VIEWS TECHNIQUE: Fluoroscopic spot image(s) were submitted for interpretation post-operatively. COMPARISON:  08/12/2020. FINDINGS: Fluoro time: 26 seconds Reported radiation dose: 4.9 mGy Three C-arm fluoroscopic images were obtained intraoperatively and submitted for post operative interpretation. These images demonstrate intramedullary nail and screw fixation of an intertrochanteric right femur fracture with near anatomic alignment. Please see the performing provider's procedural report for further detail. IMPRESSION: Intraoperative fluoroscopy, as detailed above. Electronically Signed   By: Margaretha Sheffield MD   On: 08/13/2020 09:08   DG Hip Unilat  With Pelvis 2-3 Views Right  Result  Date: 08/12/2020 CLINICAL DATA:  Acute RIGHT hip pain following fall. EXAM: DG HIP (WITH OR WITHOUT PELVIS) 2-3V RIGHT COMPARISON:  None. FINDINGS: An intertrochanteric fracture of the proximal RIGHT femur is identified with slight varus angulation. No dislocation noted. Degenerative changes in the hips and LOWER lumbar spine are present. IMPRESSION: Intertrochanteric RIGHT femur fracture with slight varus angulation. Electronically Signed   By: Margarette Canada M.D.   On: 08/12/2020 11:44   CT HEAD CODE STROKE WO CONTRAST  Addendum Date: 08/15/2020   ADDENDUM REPORT: 08/15/2020 12:02 ADDENDUM: Following multiple attempts to reach the ordering provider, these results were discussed by telephone on 08/15/2020 at 11:58 pm with provider DAVID Sabino Gasser , who verbally acknowledged these results. Electronically Signed   By: Kellie Simmering DO   On: 08/15/2020 12:02   Result Date: 08/15/2020 CLINICAL DATA:  Code stroke. Neuro deficit, acute, stroke suspected. Additional provided: Fall a few days ago, right hip surgery, right facial droop. EXAM: CT HEAD WITHOUT CONTRAST TECHNIQUE: Contiguous axial images were obtained from the base of the skull through the vertex without intravenous contrast. COMPARISON:  Brain MRI 07/02/2019. MRA head 06/29/2019. Head CT 05/20/2019. FINDINGS: Brain: As before, there is lateral and third ventriculomegaly which is out of proportion to the degree of generalized cerebral atrophy, and suggestive of normal pressure hydrocephalus. Moderate/advanced patchy and confluent hypoattenuation within the cerebral white matter, nonspecific but compatible with chronic small vessel ischemic disease. There is no acute intracranial hemorrhage. No demarcated cortical infarct. No extra-axial fluid collection. No evidence of an intracranial mass. No midline shift. Vascular: No hyperdense vessel.  Atherosclerotic calcifications. Skull: Normal. Negative for fracture or focal lesion. Sinuses/Orbits: Visualized orbits show  no acute finding. No significant paranasal sinus disease. Other: Prominent torus palatinus. ASPECTS ([place] Stroke Program Early CT Score) - Ganglionic level infarction (caudate, lentiform nuclei, internal capsule, insula, M1-M3 cortex): 7 - Supraganglionic infarction (M4-M6 cortex): 3 Total score (0-10 with 10 being normal): 10 IMPRESSION: No acute intracranial hemorrhage or acute demarcated cortical infarction. ASPECTS is 10. As before, there is lateral and third ventriculomegaly which is out of proportion to the degree of generalized cerebral atrophy. 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 Moderate/advanced chronic small vessel  ischemic changes within the cerebral white matter. Electronically Signed: By: Kellie Simmering DO On: 08/15/2020 11:38    Assessment/Plan  1. Closed nondisplaced intertrochanteric fracture of right femur, sequela -  S/P IM nail fixation on 08/13/2020 -Continue Norco 5-325 mg 1 tab every 6 hours PRN for pain and methocarbamol 500 mg every 6 hours PRN for muscle spasm -   WBAT -  Bilateral ted hose, on in am and off at HS -   Follow-up with orthopedics in 2 weeks  2. History of TIA (transient ischemic attack) -   28/9/22, she developed acute onset of mild right facial droop and dysarthria -  CT head does not show any acute infarct.  MRI of the brain does not show any acute infarct.  CT angiogram of the head and neck does show some blockages in the vertebral and carotid vessels.  Discussed with neurology and will continue aspirin and was started on Plavix.  3. Primary hypertension -   Stable, continue amlodipine 2.5 mg daily  4. Hypomagnesemia -  magnesium 2.1 , 08/17/2020 -   Continue magnesium oxide 400 mg daily  5. Hypothyroidism, unspecified type Lab Results  Component Value Date   TSH 24.653 (H) 08/13/2020   -   Continue Synthroid  6.  Slow transit constipation -   Start MiraLAX 17 g daily and senna S 8.6-50 mg 2 tabs twice a  day    Family/ staff Communication:    Discussed plan of care with resident and charge nurse.  Labs/tests ordered: Magnesium level  Goals of care:   Short-term care   Durenda Age, DNP, MSN, FNP-BC [facility] [phone] (Monday-Friday 8:00 a.m. - 5:00 p.m.) [phone] (after hours)

## 2020-08-22 ENCOUNTER — Non-Acute Institutional Stay (SKILLED_NURSING_FACILITY): Payer: PPO | Admitting: Internal Medicine

## 2020-08-22 ENCOUNTER — Encounter: Payer: Self-pay | Admitting: Internal Medicine

## 2020-08-22 DIAGNOSIS — R471 Dysarthria and anarthria: Secondary | ICD-10-CM | POA: Diagnosis not present

## 2020-08-22 DIAGNOSIS — D62 Acute posthemorrhagic anemia: Secondary | ICD-10-CM | POA: Diagnosis not present

## 2020-08-22 DIAGNOSIS — E039 Hypothyroidism, unspecified: Secondary | ICD-10-CM

## 2020-08-22 DIAGNOSIS — J9601 Acute respiratory failure with hypoxia: Secondary | ICD-10-CM

## 2020-08-22 DIAGNOSIS — S72144A Nondisplaced intertrochanteric fracture of right femur, initial encounter for closed fracture: Secondary | ICD-10-CM

## 2020-08-22 NOTE — Assessment & Plan Note (Signed)
ROS negative for bleeding dyscrasias & none reported by Staff Iron supplement

## 2020-08-22 NOTE — Progress Notes (Signed)
NURSING HOME LOCATION:  [facility] ROOM NUMBER:  [id number]  CODE STATUS: DNR  PCP:  [name] MD  This is a comprehensive admission note to this SNFperformed on this date less than 30 days from date of admission. Included are preadmission medical/surgical history; reconciled medication list; family history; social history and comprehensive review of systems.  Corrections and additions to the records were documented. Comprehensive physical exam was also performed. Additionally a clinical summary was entered for each active diagnosis pertinent to this admission in the Problem List to enhance continuity of care.  HPI: Patient was hospitalized 8/6 - 08/18/2020 presenting to the ED after mechanical fall at home.  She lost her balance and fell on her right side with the immediate onset of right-sided hip pain.  Imaging in the ED revealed an intertrochanteric right femur fracture with slight varus angulation. Right intramedullary nailing was performed 08/13/2020 by Dr Maureen Ralphs.  Aspirin 325 mg daily was initiated as prophylaxis for DVT. Postoperatively she developed respiratory failure with hypoxia which was attributed to medication/oversedation.  Supplemental oxygen was prescribed.  Aggressive incentive spirometry was employed.  Imaging did reveal some atelectasis. Postop care was also complicated by acute onset of right facial droop and dysarthria.  Code stroke was initiated and she was evaluated by Neurology via telemetry . CT and MRI revealed no acute infarct.  CT angiogram of the head and neck revealed some blockages in the vertebral and carotid vessels.  Plavix was added to the aspirin prophylaxis. TSH at admission was 24.6; L-thyroxine dose was increased to 125 mcg daily. Patient claims compliance w thyroid supplement. Amlodipine was continued for essential hypertension. She was discharged to the SNF for PT/OT.  Past medical and surgical history: includes hypothyroidism,  history of B12 deficiency, hx of neutropenia, osteopenia, tremor, and DJD. Surgeries and procedures include history of left shoulder surgery several years ago.  Social history: Nondrinker; never smoked.  She stated that she worked for [employer] until she was in her early 60s.  Family history: Noncontributory due to advanced age   Review of systems: possible subtle neurocognitive deficits made validity of responses questionable. Date given as August 16, 2020.  She could not name the POTUS stating she knew it was not Bush.  When she told me about her work history initially she told me she had retired a few years earlier but then subsequently changed this answer. Her response as to her current state was "doing very well".   She confirms that she lost her balance when she tried to squish a bug while walking back into the house.  She denied any neurologic or cardiologic prodrome. She states that she is continued have pain in the leg,but it is improved.  She did not believe she had been asking for any pain medication.   She denies any bleeding dyscrasias. She states that she had minor constipation but that has resolved.  She also states its when she has taken iron in the past its cause some nausea.  Constitutional: No fever, significant weight change, fatigue  Eyes: No redness, discharge, pain, vision change ENT/mouth: No nasal congestion, purulent discharge, earache, change in hearing, sore throat  Cardiovascular: No chest pain, palpitations, paroxysmal nocturnal dyspnea, claudication, edema  Respiratory: No cough, sputum production, hemoptysis, DOE, significant snoring, apnea  Gastrointestinal: No heartburn, dysphagia, abdominal pain, nausea /vomiting, rectal bleeding, melena, change in bowels Genitourinary: No dysuria, hematuria, pyuria, incontinence, nocturia Dermatologic: No rash, pruritus, change in appearance  of skin Neurologic: No dizziness, headache, syncope, seizures,  numbness, tingling Psychiatric: No significant anxiety, depression, insomnia, anorexia Endocrine: No change in hair/skin/nails, excessive thirst, excessive hunger, excessive urination  Hematologic/lymphatic: No significant bruising,   Physical exam:  Pertinent or positive findings: She appears her stated age.  Despite the possible slight neurocognitive issues; she is delightful and communicative.  Slight dysarthria is suggested.  There is an insignificant asymmetry of the nasolabial folds with the left being possibly slightly reduced.  She has minor low-grade rales.  Abdomen slightly protuberant.  Pedal pulses are decreased.  She has DIP osteoarthritic changes diffusely.  General appearance: Adequately nourished; no acute distress, increased work of breathing is present.   Lymphatic: No lymphadenopathy about the head, neck, axilla. Eyes: No conjunctival inflammation or lid edema is present. There is no scleral icterus. Ears:  External ear exam shows no significant lesions or deformities.   Nose:  External nasal examination shows no deformity or inflammation. Nasal mucosa are pink and moist without lesions, exudates Oral exam: Lips and gums are healthy appearing.There is no oropharyngeal erythema or exudate. Neck:  No thyromegaly, masses, tenderness noted.    Heart:  Normal rate and regular rhythm. S1 and S2 normal without gallop, murmur, click, rub.  Lungs:  without wheezes, rhonchi, rubs. Abdomen: Bowel sounds are normal.  Abdomen is soft and nontender with no organomegaly, hernias, masses. GU: Deferred  Extremities:  No cyanosis, clubbing, edema. Neurologic exam:  Balance, Rhomberg, finger to nose testing could not be completed due to clinical state Skin: Warm & dry w/o tenting. No significant lesions or rash.  See clinical summary under each active problem in the Problem List with associated updated therapeutic plan

## 2020-08-22 NOTE — Assessment & Plan Note (Addendum)
Dr Shelia Media can recheck TSH in 6-8 weeks after IP dose increase

## 2020-08-22 NOTE — Patient Instructions (Signed)
See assessment and plan under each diagnosis in the problem list and acutely for this visit 

## 2020-08-22 NOTE — Assessment & Plan Note (Addendum)
PT/OT at SNF as tolerated.  D/C Robaxin as on Beers' List w increased fall risk.

## 2020-08-23 NOTE — Assessment & Plan Note (Signed)
Neuro F/U can be arranged post SNF discharge

## 2020-08-23 NOTE — Assessment & Plan Note (Signed)
Minor rales on exam. RA O2 sats excellent.

## 2020-08-24 ENCOUNTER — Encounter: Payer: Self-pay | Admitting: Adult Health

## 2020-08-25 ENCOUNTER — Non-Acute Institutional Stay (SKILLED_NURSING_FACILITY): Payer: PPO | Admitting: Adult Health

## 2020-08-25 ENCOUNTER — Encounter: Payer: Self-pay | Admitting: Adult Health

## 2020-08-25 DIAGNOSIS — R21 Rash and other nonspecific skin eruption: Secondary | ICD-10-CM

## 2020-08-25 DIAGNOSIS — S72144S Nondisplaced intertrochanteric fracture of right femur, sequela: Secondary | ICD-10-CM | POA: Diagnosis not present

## 2020-08-25 NOTE — Progress Notes (Signed)
Location:  [facility] Room Number: [id number] Place of Service:  SNF (31) Provider:  Durenda Age, DNP, FNP-BC  Patient Care Team: [name], MD as PCP - General (Internal Medicine)  Extended Emergency Contact Information Primary Emergency Contact: [name] Address: [place] Phone: [phone] Mobile Phone: [phone] Relation: None  Code Status: DNR   Goals of care: Advanced Directive information Advanced Directives 08/25/2020  Does Patient Have a Medical Advance Directive? Yes  Type of Advance Directive Out of facility DNR (pink MOST or yellow form)  Does patient want to make changes to medical advance directive? No - Patient declined     Chief Complaint  Patient presents with   Acute Visit    Rashes    HPI:  Pt is a 87 y.o. female who is being seen for an acute visit.She is currently having a short-term rehabilitation at [facility].. She has a PMH of TIA, vitamin B12 deficiency and mild cognitive impairment. Rashes on her mid lower back noted to be flat and dry. No blisters noted. Resident denies itching nor pain. No erythema noted. She is currently having PT and OT. She had a fall at home sustaining sustaining intertrochanteric right femur fracture with slight varus angulation for which she had IM nail fixation on 08/13/20.   Past Medical History:  Diagnosis Date   Hearing loss    Hypothyroidism    Neutropenia ([place])    Osteopenia    Primary osteoarthritis    Tremor    Past Surgical History:  Procedure Laterality Date   INTRAMEDULLARY (IM) NAIL INTERTROCHANTERIC Right 08/13/2020   Procedure: INTRAMEDULLARY (IM) NAIL INTERTROCHANTRIC;  Surgeon: Gaynelle Arabian, MD;  Location: [facility];  Service: Orthopedics;  Laterality: Right;    Allergies  Allergen Reactions   Codeine Other (See Comments)    Extreme headaches   Shellfish Allergy Hives    Shellfish-Derived Products Hives   Amoxicillin Itching and Rash    Outpatient Encounter Medications as of 08/25/2020  Medication Sig   amLODipine (NORVASC) 2.5 MG tablet Take 2.5 mg by mouth every evening.   Apoaequorin (PREVAGEN) 10 MG CAPS Take 1 capsule by mouth every morning.   aspirin EC 325 MG EC tablet Take 1 tablet (325 mg total) by mouth daily with breakfast for 18 days. Then resume one 81 mg aspirin once a day.   [START ON 08/29/2020] aspirin EC 81 MG tablet Take 81 mg by mouth daily. Swallow whole.   bisacodyl (DULCOLAX) 10 MG suppository Place 10 mg rectally as needed for moderate constipation.   Cholecalciferol (VITAMIN D3) 50 MCG (2000 UT) TABS Take 2,000 Units by mouth daily.   clopidogrel (PLAVIX) 75 MG tablet Take 1 tablet (75 mg total) by mouth daily.   docusate sodium (COLACE) 100 MG capsule Take 1 capsule (100 mg total) by mouth 2 (two) times daily.   ferrous sulfate 325 (65 FE) MG tablet Take 325 mg by mouth daily with supper.   HYDROcodone-acetaminophen (NORCO/VICODIN) 5-325 MG tablet Take 1 tablet by mouth every 6 (six) hours as needed for moderate pain or severe pain (pain score 4-6).   levothyroxine (SYNTHROID) 125 MCG tablet Take 1 tablet (125 mcg total) by mouth daily before breakfast.   Magnesium Hydroxide (MILK OF MAGNESIA PO) Take 30 mLs by mouth as needed.   magnesium oxide (MAG-OX) 400 MG tablet Take 400 mg by mouth daily.   NON FORMULARY  Diet:NAS   OXYGEN Inhale into the lungs.   polyethylene glycol (MIRALAX / GLYCOLAX) 17 g packet Take 17 g by mouth daily. in 6 oz of liquid   senna-docusate (SENNA S) 8.6-50 MG tablet Take 2 tablets by mouth 2 (two) times daily.   Sodium Phosphates (RA SALINE ENEMA RE) Place 1 Dose rectally as needed.   vitamin B-12 (CYANOCOBALAMIN) 100 MCG tablet Take 100 mcg by mouth daily.   [DISCONTINUED] methocarbamol (ROBAXIN) 500 MG tablet Take 1 tablet (500 mg total) by mouth every 6 (six) hours as needed for muscle spasms.   No  facility-administered encounter medications on file as of 08/25/2020.    Review of Systems  GENERAL: No change in appetite, no fatigue, no weight changes, no fever or chills  MOUTH and THROAT: Denies oral discomfort, gingival pain or bleeding RESPIRATORY: no cough, SOB, DOE, wheezing, hemoptysis CARDIAC: No chest pain, edema or palpitations GI: No abdominal pain, diarrhea, constipation, heart burn, nausea or vomiting GU: Denies dysuria, frequency, hematuria or discharge NEUROLOGICAL: Denies dizziness, syncope, numbness, or headache PSYCHIATRIC: Denies feelings of depression or anxiety. No report of hallucinations, insomnia, paranoia, or agitation   Immunization History  Administered Date(s) Administered   Influenza, High Dose Seasonal PF 10/08/2013, 10/13/2014, 10/12/2015   Influenza, Quadrivalent, Recombinant, Inj, Pf 11/06/2016, 11/25/2017   PFIZER(Purple Top)SARS-COV-2 Vaccination 02/17/2019, 03/10/2019   Pneumococcal Conjugate-13 12/02/2012   Pneumococcal Polysaccharide-23 05/21/2010   Tdap 07/11/2016   Pertinent  Health Maintenance Due  Topic Date Due   DEXA SCAN  Never done   INFLUENZA VACCINE  08/07/2020   PNA vac Low Risk Adult  Completed   Fall Risk  08/07/2018  Falls in the past year? 0  Comment Emmi Telephone Survey: data to providers prior to load     Vitals:   08/25/20 1106  BP: (!) 96/56  Pulse: 97  Resp: 19  Temp: (!) 97.3 F (36.3 C)  Weight: 122 lb (55.3 kg)  Height: '5\' 2"'$  (1.575 m)   Body mass index is 22.31 kg/m.  Physical Exam  GENERAL APPEARANCE: Well nourished. In no acute distress. Normal body habitus SKIN:  Dry flat rashes on sacral area, no erythema. Right hip surgical incision with sutures, dry and no erythema. MOUTH and THROAT: Lips are without lesions. Oral mucosa is moist and without lesions.  RESPIRATORY: Breathing is even & unlabored, BS CTAB CARDIAC: RRR, no murmur,no extra heart sounds, no edema GI: Abdomen soft, normal BS, no  masses, no tenderness NEUROLOGICAL: There is no tremor. Speech is clear. Alert to self, disoriented to time and place. PSYCHIATRIC:  Affect and behavior are appropriate  Labs reviewed: Recent Labs    08/15/20 0331 08/16/20 0358 08/17/20 0348  NA 131* 135 134*  K 3.9 3.6 3.7  CL 97* 101 100  CO2 28 32 28  GLUCOSE 94 90 99  BUN '13 8 12  '$ CREATININE 0.58 0.49 0.53  CALCIUM 8.1* 8.3* 8.3*  MG 1.9 2.0 2.1   No results for input(s): AST, ALT, ALKPHOS, BILITOT, PROT, ALBUMIN in the last 8760 hours. Recent Labs    08/15/20 0331 08/16/20 0358 08/17/20 0348  WBC 4.1 3.8* 4.4  NEUTROABS 1.6* 1.2* 1.7  HGB 8.1* 8.1* 8.0*  HCT 25.0* 25.1* 25.4*  MCV 87.7 88.1 88.8  PLT 222 244 301   Lab Results  Component Value Date   TSH 24.653 (H) 08/13/2020   Lab Results  Component Value Date   HGBA1C 5.8 (H) 08/15/2020   Lab Results  Component Value  Date   CHOL 132 08/15/2020   HDL 50 08/15/2020   LDLCALC 69 08/15/2020   TRIG 64 08/15/2020   CHOLHDL 2.6 08/15/2020    Significant Diagnostic Results in last 30 days:  CT ANGIO HEAD W OR WO CONTRAST  Result Date: 08/15/2020 CLINICAL DATA:  Neuro deficit, acute, stroke suspected facial droop, slurred speech; Neuro deficit, acute, stroke suspected Facial droop, slurred speech EXAM: CT ANGIOGRAPHY HEAD AND NECK TECHNIQUE: Multidetector CT imaging of the head and neck was performed using the standard protocol during bolus administration of intravenous contrast. Multiplanar CT image reconstructions and MIPs were obtained to evaluate the vascular anatomy. Carotid stenosis measurements (when applicable) are obtained utilizing NASCET criteria, using the distal internal carotid diameter as the denominator. CONTRAST:  88m OMNIPAQUE IOHEXOL 350 MG/ML SOLN COMPARISON:  None. FINDINGS: CTA NECK FINDINGS Aortic arch: Atherosclerosis of the aorta and the great vessels. Great vessel origins are patent. Right carotid system: Patent. No significant (greater than  50%) stenosis. Moderate calcific and noncalcific atherosclerosis at the carotid bifurcation. Tortuous common and internal carotid arteries. Left carotid system: Patent. Moderate predominantly calcific atherosclerosis at the carotid bifurcation without greater than 50% stenosis. Tortuous common and internal carotid arteries. Vertebral arteries: Patent. Moderate stenosis of the left vertebral artery origin. Mildly left dominant. Tortuous bilaterally. Skeleton: Severe degenerative disc disease at C6-C7 where there is marked disc height loss, endplate sclerosis and posterior disc osteophyte complex. Other neck: No acute abnormality. Upper chest: Motion limited evaluation without consolidation in the visualized lung apices. Review of the MIP images confirms the above findings CTA HEAD FINDINGS Anterior circulation: Moderate calcific atherosclerosis of bilateral intracranial ICAs. Moderate right and mild left paraclinoid ICA stenosis. Bilateral M1 and proximal M2 MCA branches are patent without proximal hemodynamically significant stenosis. Bilateral ACAs are patent. 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 Posterior circulation: Bilateral intradural vertebral arteries, basilar artery, and posterior cerebral arteries are patent without proximal hemodynamically significant stenosis. No aneurysm identified. Venous sinuses: As permitted by contrast timing, patent. Review of the MIP images confirms the above findings IMPRESSION: CTA head: 1. No large vessel occlusion. 2. Moderate right and mild left paraclinoid ICA stenosis. 3. 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 CTA neck: 1. Moderate stenosis of the left vertebral artery origin. 2. Moderate bilateral carotid bifurcation atherosclerosis without greater than 50% stenosis. Electronically Signed   By: FMargaretha SheffieldMD   On: 08/15/2020 19:49   DG Abd 1 View  Result Date: 08/17/2020 CLINICAL DATA:  Nausea and vomiting.  EXAM: ABDOMEN - 1 VIEW COMPARISON:  None. FINDINGS: Bowel gas pattern is within normal limits. There does not appear to be an abnormal amount of fecal matter. No abnormal soft tissue calcifications other than atherosclerotic vascular calcification. Previous ORIF for basicervical right femoral neck fracture. Curvature and chronic degenerative change of the spine. IMPRESSION: Gas pattern within normal limits. There does not appear to be an abnormal amount of fecal matter. Electronically Signed   By: MNelson ChimesM.D.   On: 08/17/2020 16:26   CT ANGIO NECK W OR WO CONTRAST  Result Date: 08/15/2020 CLINICAL DATA:  Neuro deficit, acute, stroke suspected facial droop, slurred speech; Neuro deficit, acute, stroke suspected Facial droop, slurred speech EXAM: CT ANGIOGRAPHY HEAD AND NECK TECHNIQUE: Multidetector CT imaging of the head and neck was performed using the standard protocol during bolus administration of intravenous contrast. Multiplanar CT image reconstructions and MIPs were obtained to evaluate the vascular anatomy. Carotid stenosis measurements (when  applicable) are obtained utilizing NASCET criteria, using the distal internal carotid diameter as the denominator. CONTRAST:  14m OMNIPAQUE IOHEXOL 350 MG/ML SOLN COMPARISON:  None. FINDINGS: CTA NECK FINDINGS Aortic arch: Atherosclerosis of the aorta and the great vessels. Great vessel origins are patent. Right carotid system: Patent. No significant (greater than 50%) stenosis. Moderate calcific and noncalcific atherosclerosis at the carotid bifurcation. Tortuous common and internal carotid arteries. Left carotid system: Patent. Moderate predominantly calcific atherosclerosis at the carotid bifurcation without greater than 50% stenosis. Tortuous common and internal carotid arteries. Vertebral arteries: Patent. Moderate stenosis of the left vertebral artery origin. Mildly left dominant. Tortuous bilaterally. Skeleton: Severe degenerative disc disease at C6-C7  where there is marked disc height loss, endplate sclerosis and posterior disc osteophyte complex. Other neck: No acute abnormality. Upper chest: Motion limited evaluation without consolidation in the visualized lung apices. Review of the MIP images confirms the above findings CTA HEAD FINDINGS Anterior circulation: Moderate calcific atherosclerosis of bilateral intracranial ICAs. Moderate right and mild left paraclinoid ICA stenosis. Bilateral M1 and proximal M2 MCA branches are patent without proximal hemodynamically significant stenosis. Bilateral ACAs are patent. 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 Posterior circulation: Bilateral intradural vertebral arteries, basilar artery, and posterior cerebral arteries are patent without proximal hemodynamically significant stenosis. No aneurysm identified. Venous sinuses: As permitted by contrast timing, patent. Review of the MIP images confirms the above findings IMPRESSION: CTA head: 1. No large vessel occlusion. 2. Moderate right and mild left paraclinoid ICA stenosis. 3. 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 CTA neck: 1. Moderate stenosis of the left vertebral artery origin. 2. Moderate bilateral carotid bifurcation atherosclerosis without greater than 50% stenosis. Electronically Signed   By: FMargaretha SheffieldMD   On: 08/15/2020 19:49   MR BRAIN WO CONTRAST  Result Date: 08/16/2020 CLINICAL DATA:  Neuro deficit, acute, stroke suspected; facial droop, slurred speech. Additional history provided: Status post hip surgery August 7th, developed acute onset mild right facial droop and dysarthria 08/15/2020. Evaluate for possible CVA. EXAM: MRI HEAD WITHOUT CONTRAST TECHNIQUE: Multiplanar, multiecho pulse sequences of the brain and surrounding structures were obtained without intravenous contrast. COMPARISON:  Noncontrast head CT and CT angiogram head/neck 08/15/2020. MRI brain 07/02/2019. FINDINGS: Brain:  Generalized cerebral and cerebellar atrophy. As before, there is lateral and third ventriculomegaly which appears out proportion to the degree of generalized cerebral volume loss. 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 Advanced patchy and confluent T2/FLAIR hyperintensity within the cerebral white matter, nonspecific but compatible with chronic small vessel ischemic disease. There is no acute infarct. No evidence of an intracranial mass. No chronic intracranial blood products. No extra-axial fluid collection. No midline shift. Vascular: Maintained proximal large arterial flow voids. Skull and upper cervical spine: Focal suspicious marrow lesion. Sinuses/Orbits: Visualized orbits show no acute finding. Leftward gaze no significant paranasal sinus disease. IMPRESSION: No evidence of acute intracranial abnormality. As before, there is lateral and third ventriculomegaly which appears out of proportion to the degree of generalized cerebral volume loss. These findings suggest normal pressure hydrocephalus. Advanced chronic small vessel ischemic changes within the cerebral white matter. Electronically Signed   By: KKellie SimmeringDO   On: 08/16/2020 07:53   DG C-Arm 1-60 Min-No Report  Result Date: 08/13/2020 Fluoroscopy was utilized by the requesting physician.  No radiographic interpretation.   ECHOCARDIOGRAM COMPLETE  Result Date: 08/17/2020    ECHOCARDIOGRAM REPORT   Patient Name:   Dreana P Ridgely Date of Exam:  08/17/2020 Medical Rec #:  [id number]      Height:       62.0 in Accession #:    [id number]     Weight:       137.8 lb Date of Birth:  07-04-1925      BSA:          1.632 m Patient Age:    24 years       BP:           144/56 mmHg Patient Gender: F              HR:           88 bpm. Exam Location:  Inpatient Procedure: 2D Echo, Cardiac Doppler and Color Doppler Indications:    CVA  History:        Patient has no prior history of Echocardiogram examinations.                 TIA;  Signs/Symptoms:Shortness of Breath.  Sonographer:    Luisa Hart RDCS Referring Phys: [place]  1. Left ventricular ejection fraction, by estimation, is 55 to 60%. The left ventricle has normal function. The left ventricle has no regional wall motion abnormalities. There is mild left ventricular hypertrophy. Left ventricular diastolic parameters are consistent with Grade I diastolic dysfunction (impaired relaxation).  2. Right ventricular systolic function is normal. The right ventricular size is normal. There is normal pulmonary artery systolic pressure. The estimated right ventricular systolic pressure is 0000000 mmHg.  3. The mitral valve is grossly normal. No evidence of mitral valve regurgitation.  4. The aortic valve is tricuspid. Aortic valve regurgitation is trivial. Mild to moderate aortic valve sclerosis/calcification is present, without any evidence of aortic stenosis. Aortic valve mean gradient measures 5.0 mmHg.  5. The inferior vena cava is normal in size with greater than 50% respiratory variability, suggesting right atrial pressure of 3 mmHg. Comparison(s): No prior Echocardiogram. FINDINGS  Left Ventricle: Left ventricular ejection fraction, by estimation, is 55 to 60%. The left ventricle has normal function. The left ventricle has no regional wall motion abnormalities. The left ventricular internal cavity size was normal in size. There is  mild left ventricular hypertrophy. Left ventricular diastolic parameters are consistent with Grade I diastolic dysfunction (impaired relaxation). Indeterminate filling pressures. Right Ventricle: The right ventricular size is normal. No increase in right ventricular wall thickness. Right ventricular systolic function is normal. There is normal pulmonary artery systolic pressure. The tricuspid regurgitant velocity is 2.55 m/s, and  with an assumed right atrial pressure of 3 mmHg, the estimated right ventricular systolic pressure is 0000000 mmHg.  Left Atrium: Left atrial size was normal in size. Right Atrium: Right atrial size was normal in size. Pericardium: There is no evidence of pericardial effusion. Mitral Valve: The mitral valve is grossly normal. No evidence of mitral valve regurgitation. Tricuspid Valve: The tricuspid valve is grossly normal. Tricuspid valve regurgitation is trivial. Aortic Valve: The aortic valve is tricuspid. Aortic valve regurgitation is trivial. Mild to moderate aortic valve sclerosis/calcification is present, without any evidence of aortic stenosis. Aortic valve mean gradient measures 5.0 mmHg. Aortic valve peak  gradient measures 9.0 mmHg. Aortic valve area, by VTI measures 2.00 cm. Pulmonic Valve: The pulmonic valve was grossly normal. Pulmonic valve regurgitation is trivial. Aorta: The aortic root and ascending aorta are structurally normal, with no evidence of dilitation. Venous: The inferior vena cava is normal in size with greater than 50% respiratory variability, suggesting right  atrial pressure of 3 mmHg. IAS/Shunts: The interatrial septum was not well visualized.  LEFT VENTRICLE PLAX 2D LVIDd:         3.70 cm     Diastology LVIDs:         2.00 cm     LV e' medial:    4.13 cm/s LV PW:         1.20 cm     LV E/e' medial:  15.9 LV IVS:        1.20 cm     LV e' lateral:   7.51 cm/s LVOT diam:     2.30 cm     LV E/e' lateral: 8.7 LV SV:         43 LV SV Index:   26 LVOT Area:     4.15 cm  LV Volumes (MOD) LV vol d, MOD A2C: 43.0 ml LV vol d, MOD A4C: 47.1 ml LV vol s, MOD A2C: 31.0 ml LV vol s, MOD A4C: 20.6 ml LV SV MOD A2C:     12.0 ml LV SV MOD A4C:     47.1 ml LV SV MOD BP:      20.6 ml RIGHT VENTRICLE RV Basal diam:  3.00 cm RV Mid diam:    1.80 cm RV S prime:     12.60 cm/s TAPSE (M-mode): 1.9 cm LEFT ATRIUM             Index       RIGHT ATRIUM           Index LA diam:        3.10 cm 1.90 cm/m  RA Area:     17.30 cm LA Vol (A2C):   10.8 ml 6.62 ml/m  RA Volume:   48.70 ml  29.85 ml/m LA Vol (A4C):   26.8 ml 16.42  ml/m LA Biplane Vol: 17.3 ml 10.60 ml/m  AORTIC VALVE                    PULMONIC VALVE AV Area (Vmax):    1.93 cm     PV Vmax:          0.92 m/s AV Area (Vmean):   1.85 cm     PV Vmean:         60.300 cm/s AV Area (VTI):     2.00 cm     PV VTI:           0.179 m AV Vmax:           150.00 cm/s  PV Peak grad:     3.4 mmHg AV Vmean:          101.000 cm/s PV Mean grad:     2.0 mmHg AV VTI:            0.216 m      PR End Diast Vel: 6.97 msec AV Peak Grad:      9.0 mmHg AV Mean Grad:      5.0 mmHg LVOT Vmax:         69.50 cm/s LVOT Vmean:        44.900 cm/s LVOT VTI:          0.104 m LVOT/AV VTI ratio: 0.48 AR Vena Contracta: 0.20 cm  AORTA Ao Root diam: 3.70 cm Ao Asc diam:  3.20 cm MITRAL VALVE                TRICUSPID VALVE MV Area (PHT): 4.17 cm  TR Peak grad:   26.0 mmHg MV Decel Time: 182 msec     TR Vmax:        255.00 cm/s MV E velocity: 65.60 cm/s MV A velocity: 126.00 cm/s  SHUNTS MV E/A ratio:  0.52         Systemic VTI:  0.10 m                             Systemic Diam: 2.30 cm Lyman Bishop MD Electronically signed by Lyman Bishop MD Signature Date/Time: 08/17/2020/2:40:16 PM    Final    DG HIP OPERATIVE UNILAT W OR W/O PELVIS RIGHT  Result Date: 08/13/2020 CLINICAL DATA:  Surgery, elective Z41.9 (ICD-10-CM) EXAM: OPERATIVE RIGHT HIP (WITH PELVIS IF PERFORMED) 3 VIEWS TECHNIQUE: Fluoroscopic spot image(s) were submitted for interpretation post-operatively. COMPARISON:  08/12/2020. FINDINGS: Fluoro time: 26 seconds Reported radiation dose: 4.9 mGy Three C-arm fluoroscopic images were obtained intraoperatively and submitted for post operative interpretation. These images demonstrate intramedullary nail and screw fixation of an intertrochanteric right femur fracture with near anatomic alignment. Please see the performing provider's procedural report for further detail. IMPRESSION: Intraoperative fluoroscopy, as detailed above. Electronically Signed   By: Margaretha Sheffield MD   On: 08/13/2020 09:08    DG Hip Unilat  With Pelvis 2-3 Views Right  Result Date: 08/12/2020 CLINICAL DATA:  Acute RIGHT hip pain following fall. EXAM: DG HIP (WITH OR WITHOUT PELVIS) 2-3V RIGHT COMPARISON:  None. FINDINGS: An intertrochanteric fracture of the proximal RIGHT femur is identified with slight varus angulation. No dislocation noted. Degenerative changes in the hips and LOWER lumbar spine are present. IMPRESSION: Intertrochanteric RIGHT femur fracture with slight varus angulation. Electronically Signed   By: Margarette Canada M.D.   On: 08/12/2020 11:44   CT HEAD CODE STROKE WO CONTRAST  Addendum Date: 08/15/2020   ADDENDUM REPORT: 08/15/2020 12:02 ADDENDUM: Following multiple attempts to reach the ordering provider, these results were discussed by telephone on 08/15/2020 at 11:58 pm with provider DAVID Sabino Gasser , who verbally acknowledged these results. Electronically Signed   By: Kellie Simmering DO   On: 08/15/2020 12:02   Result Date: 08/15/2020 CLINICAL DATA:  Code stroke. Neuro deficit, acute, stroke suspected. Additional provided: Fall a few days ago, right hip surgery, right facial droop. EXAM: CT HEAD WITHOUT CONTRAST TECHNIQUE: Contiguous axial images were obtained from the base of the skull through the vertex without intravenous contrast. COMPARISON:  Brain MRI 07/02/2019. MRA head 06/29/2019. Head CT 05/20/2019. FINDINGS: Brain: As before, there is lateral and third ventriculomegaly which is out of proportion to the degree of generalized cerebral atrophy, and suggestive of normal pressure hydrocephalus. Moderate/advanced patchy and confluent hypoattenuation within the cerebral white matter, nonspecific but compatible with chronic small vessel ischemic disease. There is no acute intracranial hemorrhage. No demarcated cortical infarct. No extra-axial fluid collection. No evidence of an intracranial mass. No midline shift. Vascular: No hyperdense vessel.  Atherosclerotic calcifications. Skull: Normal. Negative for fracture  or focal lesion. Sinuses/Orbits: Visualized orbits show no acute finding. No significant paranasal sinus disease. Other: Prominent torus palatinus. ASPECTS ([place] Stroke Program Early CT Score) - Ganglionic level infarction (caudate, lentiform nuclei, internal capsule, insula, M1-M3 cortex): 7 - Supraganglionic infarction (M4-M6 cortex): 3 Total score (0-10 with 10 being normal): 10 IMPRESSION: No acute intracranial hemorrhage or acute demarcated cortical infarction. ASPECTS is 10. As before, there is lateral and third ventriculomegaly which is out of proportion to the degree  of generalized cerebral atrophy. 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 Moderate/advanced chronic small vessel ischemic changes within the cerebral white matter. Electronically Signed: By: Kellie Simmering DO On: 08/15/2020 11:38    Assessment/Plan  1. Rash -   rashes are flat, dry and not erythematous -   thought to be due to dry skin -  will start Cetaphil cream aplly to rashes on mid lower back BID X 2 weeks   2. Closed nondisplaced intertrochanteric fracture of right femur, sequela -   S/P IM nail fixation on 08/15/20 -  continue Norco 5-325 mg 1 tab Q 6 hours PRN for pain -   continue PT and OT, for therapeutic strengthening exercises -    follow up with orthopedics   Family/ staff Communication:   Discussed plan of care with resident and charge nurse.  Labs/tests ordered:   None  Goals of care:   Short-term care   Durenda Age, DNP, MSN, FNP-BC [facility] [phone] (Monday-Friday 8:00 a.m. - 5:00 p.m.) [phone] (after hours)

## 2020-08-29 DIAGNOSIS — Z4789 Encounter for other orthopedic aftercare: Secondary | ICD-10-CM | POA: Diagnosis not present

## 2020-08-31 ENCOUNTER — Encounter: Payer: Self-pay | Admitting: Adult Health

## 2020-08-31 ENCOUNTER — Non-Acute Institutional Stay (SKILLED_NURSING_FACILITY): Payer: PPO | Admitting: Adult Health

## 2020-08-31 DIAGNOSIS — D62 Acute posthemorrhagic anemia: Secondary | ICD-10-CM

## 2020-08-31 DIAGNOSIS — S72144S Nondisplaced intertrochanteric fracture of right femur, sequela: Secondary | ICD-10-CM | POA: Diagnosis not present

## 2020-08-31 DIAGNOSIS — Z8673 Personal history of transient ischemic attack (TIA), and cerebral infarction without residual deficits: Secondary | ICD-10-CM

## 2020-08-31 DIAGNOSIS — I10 Essential (primary) hypertension: Secondary | ICD-10-CM | POA: Diagnosis not present

## 2020-08-31 NOTE — Progress Notes (Signed)
Location:  [facility] [place] Room Number: [id number] Place of Service:  SNF (31) Provider:  Durenda Age, DNP, FNP-BC  Patient Care Team: [name], MD as PCP - General (Internal Medicine)  Extended Emergency Contact Information Primary Emergency Contact: [name] Address: [place] Phone: [phone] Mobile Phone: [phone] Relation: None  Code Status:  DNR  Goals of care: Advanced Directive information Advanced Directives 08/31/2020  Does Patient Have a Medical Advance Directive? Yes  Type of Paramedic of [place];Living will;Out of facility DNR (pink MOST or yellow form)  Does patient want to make changes to medical advance directive? No - Patient declined  Copy of [place] in Chart? No - copy requested     Chief Complaint  Patient presents with   Acute Visit    Low Hemoglobin.    HPI:  Pt is a 87 y.o. female seen today for an acute visit. She is currently having a short-term rehabilitation post IM nail fixation of intertrochanteric right femur fracture on 08/13/20 sustained from a fall at home. She is currently on FeSO4 325 mg daily. Latest hgb 7.5, dropped from 8.0 (08/17/20). She takes Plavix 75 mg daily and ASA EC 81 mg daily for TIA. SBPs ranging from 99 to 124. She takes Amlodipine 2.5 mg daily for hypertension.   Past Medical History:  Diagnosis Date   Hearing loss    Hypothyroidism    Neutropenia ([place])    Osteopenia    Primary osteoarthritis    Tremor    Past Surgical History:  Procedure Laterality Date   INTRAMEDULLARY (IM) NAIL INTERTROCHANTERIC Right 08/13/2020   Procedure: INTRAMEDULLARY (IM) NAIL INTERTROCHANTRIC;  Surgeon: Gaynelle Arabian, MD;  Location: [facility];  Service: Orthopedics;  Laterality: Right;    Allergies  Allergen Reactions   Codeine Other (See Comments)    Extreme headaches   Shellfish Allergy Hives    Shellfish-Derived Products Hives   Amoxicillin Itching and Rash    Outpatient Encounter Medications as of 08/31/2020  Medication Sig   amLODipine (NORVASC) 2.5 MG tablet Take 2.5 mg by mouth every evening.   Apoaequorin (PREVAGEN) 10 MG CAPS Take 1 capsule by mouth every morning.   aspirin EC 81 MG tablet Take 81 mg by mouth daily. Swallow whole.   bisacodyl (DULCOLAX) 10 MG suppository Place 10 mg rectally as needed for moderate constipation.   Cholecalciferol (VITAMIN D3) 50 MCG (2000 UT) TABS Take 2,000 Units by mouth daily.   clopidogrel (PLAVIX) 75 MG tablet Take 1 tablet (75 mg total) by mouth daily.   Emollient (CETAPHIL) cream Apply 1 application topically 2 (two) times daily.   ferrous sulfate 325 (65 FE) MG tablet Take 325 mg by mouth daily with supper.   HYDROcodone-acetaminophen (NORCO/VICODIN) 5-325 MG tablet Take 1 tablet by mouth every 6 (six) hours as needed for moderate pain or severe pain (pain score 4-6).   levothyroxine (SYNTHROID) 125 MCG tablet Take 1 tablet (125 mcg total) by mouth daily before breakfast.   Magnesium Hydroxide (MILK OF MAGNESIA PO) Take 30 mLs by mouth as needed.   magnesium oxide (MAG-OX) 400 MG tablet Take 400 mg by mouth daily.   NON FORMULARY Diet:NAS   OXYGEN Inhale into the lungs.   polyethylene glycol (MIRALAX / GLYCOLAX) 17 g packet Take 17 g by mouth daily. in 6 oz of liquid   senna-docusate (SENOKOT-S) 8.6-50 MG tablet Take  2 tablets by mouth 2 (two) times daily.   Sodium Phosphates (RA SALINE ENEMA RE) Place 1 Dose rectally as needed.   vitamin B-12 (CYANOCOBALAMIN) 100 MCG tablet Take 100 mcg by mouth daily.   [DISCONTINUED] aspirin EC 325 MG EC tablet Take 1 tablet (325 mg total) by mouth daily with breakfast for 18 days. Then resume one 81 mg aspirin once a day.   [DISCONTINUED] docusate sodium (COLACE) 100 MG capsule Take 1 capsule (100 mg total) by mouth 2 (two) times daily.   No facility-administered encounter medications on file as  of 08/31/2020.    Review of Systems  GENERAL: No change in appetite, no fatigue, no weight changes, no fever MOUTH and THROAT: Denies oral discomfort, gingival pain or bleeding RESPIRATORY: no cough, SOB, DOE, wheezing, hemoptysis CARDIAC: No chest pain, edema or palpitations GI: No abdominal pain, diarrhea, constipation, heart burn, nausea or vomiting GU: Denies dysuria, frequency, hematuria or discharge NEUROLOGICAL: Denies dizziness, syncope, numbness, or headache PSYCHIATRIC: Denies feelings of depression or anxiety. No report of hallucinations, insomnia, paranoia, or agitation   Immunization History  Administered Date(s) Administered   Influenza, High Dose Seasonal PF 10/08/2013, 10/13/2014, 10/12/2015   Influenza, Quadrivalent, Recombinant, Inj, Pf 11/06/2016, 11/25/2017   PFIZER(Purple Top)SARS-COV-2 Vaccination 02/17/2019, 03/10/2019   Pneumococcal Conjugate-13 12/02/2012   Pneumococcal Polysaccharide-23 05/21/2010   Tdap 07/11/2016   Pertinent  Health Maintenance Due  Topic Date Due   DEXA SCAN  Never done   INFLUENZA VACCINE  08/07/2020   PNA vac Low Risk Adult  Completed   Fall Risk  08/07/2018  Falls in the past year? 0  Comment Emmi Telephone Survey: data to providers prior to load     Vitals:   08/31/20 1242  BP: 122/60  Pulse: 88  Resp: 20  Temp: 97.7 F (36.5 C)  Weight: 122 lb 12.8 oz (55.7 kg)  Height: '5\' 2"'$  (1.575 m)   Body mass index is 22.46 kg/m.  Physical Exam  GENERAL APPEARANCE: Well nourished. In no acute distress. Normal body habitus SKIN:  Skin is warm and dry.  MOUTH and THROAT: Lips are without lesions. Oral mucosa is moist and without lesions.  RESPIRATORY: Breathing is even & unlabored, BS CTAB CARDIAC: RRR, no murmur,no extra heart sounds, no edema GI: Abdomen soft, normal BS, no masses, no tenderness NEUROLOGICAL: There is no tremor. Speech is clear. Alert to self, disoriented to time and place. PSYCHIATRIC:  Affect and  behavior are appropriate  Labs reviewed: Recent Labs    08/15/20 0331 08/16/20 0358 08/17/20 0348  NA 131* 135 134*  K 3.9 3.6 3.7  CL 97* 101 100  CO2 28 32 28  GLUCOSE 94 90 99  BUN '13 8 12  '$ CREATININE 0.58 0.49 0.53  CALCIUM 8.1* 8.3* 8.3*  MG 1.9 2.0 2.1   No results for input(s): AST, ALT, ALKPHOS, BILITOT, PROT, ALBUMIN in the last 8760 hours. Recent Labs    08/15/20 0331 08/16/20 0358 08/17/20 0348  WBC 4.1 3.8* 4.4  NEUTROABS 1.6* 1.2* 1.7  HGB 8.1* 8.1* 8.0*  HCT 25.0* 25.1* 25.4*  MCV 87.7 88.1 88.8  PLT 222 244 301   Lab Results  Component Value Date   TSH 24.653 (H) 08/13/2020   Lab Results  Component Value Date   HGBA1C 5.8 (H) 08/15/2020   Lab Results  Component Value Date   CHOL 132 08/15/2020   HDL 50 08/15/2020   LDLCALC 69 08/15/2020   TRIG 64 08/15/2020   CHOLHDL 2.6  08/15/2020    Significant Diagnostic Results in last 30 days:  CT ANGIO HEAD W OR WO CONTRAST  Result Date: 08/15/2020 CLINICAL DATA:  Neuro deficit, acute, stroke suspected facial droop, slurred speech; Neuro deficit, acute, stroke suspected Facial droop, slurred speech EXAM: CT ANGIOGRAPHY HEAD AND NECK TECHNIQUE: Multidetector CT imaging of the head and neck was performed using the standard protocol during bolus administration of intravenous contrast. Multiplanar CT image reconstructions and MIPs were obtained to evaluate the vascular anatomy. Carotid stenosis measurements (when applicable) are obtained utilizing NASCET criteria, using the distal internal carotid diameter as the denominator. CONTRAST:  75m OMNIPAQUE IOHEXOL 350 MG/ML SOLN COMPARISON:  None. FINDINGS: CTA NECK FINDINGS Aortic arch: Atherosclerosis of the aorta and the great vessels. Great vessel origins are patent. Right carotid system: Patent. No significant (greater than 50%) stenosis. Moderate calcific and noncalcific atherosclerosis at the carotid bifurcation. Tortuous common and internal carotid arteries. Left  carotid system: Patent. Moderate predominantly calcific atherosclerosis at the carotid bifurcation without greater than 50% stenosis. Tortuous common and internal carotid arteries. Vertebral arteries: Patent. Moderate stenosis of the left vertebral artery origin. Mildly left dominant. Tortuous bilaterally. Skeleton: Severe degenerative disc disease at C6-C7 where there is marked disc height loss, endplate sclerosis and posterior disc osteophyte complex. Other neck: No acute abnormality. Upper chest: Motion limited evaluation without consolidation in the visualized lung apices. Review of the MIP images confirms the above findings CTA HEAD FINDINGS Anterior circulation: Moderate calcific atherosclerosis of bilateral intracranial ICAs. Moderate right and mild left paraclinoid ICA stenosis. Bilateral M1 and proximal M2 MCA branches are patent without proximal hemodynamically significant stenosis. Bilateral ACAs are patent. 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 Posterior circulation: Bilateral intradural vertebral arteries, basilar artery, and posterior cerebral arteries are patent without proximal hemodynamically significant stenosis. No aneurysm identified. Venous sinuses: As permitted by contrast timing, patent. Review of the MIP images confirms the above findings IMPRESSION: CTA head: 1. No large vessel occlusion. 2. Moderate right and mild left paraclinoid ICA stenosis. 3. 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 CTA neck: 1. Moderate stenosis of the left vertebral artery origin. 2. Moderate bilateral carotid bifurcation atherosclerosis without greater than 50% stenosis. Electronically Signed   By: FMargaretha SheffieldMD   On: 08/15/2020 19:49   DG Abd 1 View  Result Date: 08/17/2020 CLINICAL DATA:  Nausea and vomiting. EXAM: ABDOMEN - 1 VIEW COMPARISON:  None. FINDINGS: Bowel gas pattern is within normal limits. There does not appear to be an abnormal amount of  fecal matter. No abnormal soft tissue calcifications other than atherosclerotic vascular calcification. Previous ORIF for basicervical right femoral neck fracture. Curvature and chronic degenerative change of the spine. IMPRESSION: Gas pattern within normal limits. There does not appear to be an abnormal amount of fecal matter. Electronically Signed   By: MNelson ChimesM.D.   On: 08/17/2020 16:26   CT ANGIO NECK W OR WO CONTRAST  Result Date: 08/15/2020 CLINICAL DATA:  Neuro deficit, acute, stroke suspected facial droop, slurred speech; Neuro deficit, acute, stroke suspected Facial droop, slurred speech EXAM: CT ANGIOGRAPHY HEAD AND NECK TECHNIQUE: Multidetector CT imaging of the head and neck was performed using the standard protocol during bolus administration of intravenous contrast. Multiplanar CT image reconstructions and MIPs were obtained to evaluate the vascular anatomy. Carotid stenosis measurements (when applicable) are obtained utilizing NASCET criteria, using the distal internal carotid diameter as the denominator. CONTRAST:  779mOMNIPAQUE IOHEXOL 350 MG/ML SOLN COMPARISON:  None. FINDINGS: CTA NECK FINDINGS Aortic arch: Atherosclerosis of the aorta and the great vessels. Great vessel origins are patent. Right carotid system: Patent. No significant (greater than 50%) stenosis. Moderate calcific and noncalcific atherosclerosis at the carotid bifurcation. Tortuous common and internal carotid arteries. Left carotid system: Patent. Moderate predominantly calcific atherosclerosis at the carotid bifurcation without greater than 50% stenosis. Tortuous common and internal carotid arteries. Vertebral arteries: Patent. Moderate stenosis of the left vertebral artery origin. Mildly left dominant. Tortuous bilaterally. Skeleton: Severe degenerative disc disease at C6-C7 where there is marked disc height loss, endplate sclerosis and posterior disc osteophyte complex. Other neck: No acute abnormality. Upper chest:  Motion limited evaluation without consolidation in the visualized lung apices. Review of the MIP images confirms the above findings CTA HEAD FINDINGS Anterior circulation: Moderate calcific atherosclerosis of bilateral intracranial ICAs. Moderate right and mild left paraclinoid ICA stenosis. Bilateral M1 and proximal M2 MCA branches are patent without proximal hemodynamically significant stenosis. Bilateral ACAs are patent. 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 Posterior circulation: Bilateral intradural vertebral arteries, basilar artery, and posterior cerebral arteries are patent without proximal hemodynamically significant stenosis. No aneurysm identified. Venous sinuses: As permitted by contrast timing, patent. Review of the MIP images confirms the above findings IMPRESSION: CTA head: 1. No large vessel occlusion. 2. Moderate right and mild left paraclinoid ICA stenosis. 3. 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 CTA neck: 1. Moderate stenosis of the left vertebral artery origin. 2. Moderate bilateral carotid bifurcation atherosclerosis without greater than 50% stenosis. Electronically Signed   By: Margaretha Sheffield MD   On: 08/15/2020 19:49   MR BRAIN WO CONTRAST  Result Date: 08/16/2020 CLINICAL DATA:  Neuro deficit, acute, stroke suspected; facial droop, slurred speech. Additional history provided: Status post hip surgery August 7th, developed acute onset mild right facial droop and dysarthria 08/15/2020. Evaluate for possible CVA. EXAM: MRI HEAD WITHOUT CONTRAST TECHNIQUE: Multiplanar, multiecho pulse sequences of the brain and surrounding structures were obtained without intravenous contrast. COMPARISON:  Noncontrast head CT and CT angiogram head/neck 08/15/2020. MRI brain 07/02/2019. FINDINGS: Brain: Generalized cerebral and cerebellar atrophy. As before, there is lateral and third ventriculomegaly which appears out proportion to the degree of  generalized cerebral volume loss. 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 Advanced patchy and confluent T2/FLAIR hyperintensity within the cerebral white matter, nonspecific but compatible with chronic small vessel ischemic disease. There is no acute infarct. No evidence of an intracranial mass. No chronic intracranial blood products. No extra-axial fluid collection. No midline shift. Vascular: Maintained proximal large arterial flow voids. Skull and upper cervical spine: Focal suspicious marrow lesion. Sinuses/Orbits: Visualized orbits show no acute finding. Leftward gaze no significant paranasal sinus disease. IMPRESSION: No evidence of acute intracranial abnormality. As before, there is lateral and third ventriculomegaly which appears out of proportion to the degree of generalized cerebral volume loss. These findings suggest normal pressure hydrocephalus. Advanced chronic small vessel ischemic changes within the cerebral white matter. Electronically Signed   By: Kellie Simmering DO   On: 08/16/2020 07:53   DG C-Arm 1-60 Min-No Report  Result Date: 08/13/2020 Fluoroscopy was utilized by the requesting physician.  No radiographic interpretation.   ECHOCARDIOGRAM COMPLETE  Result Date: 08/17/2020    ECHOCARDIOGRAM REPORT   Patient Name:   Tanya [name] Casique Date of Exam: 08/17/2020 Medical Rec #:  [id number]      Height:       62.0 in Accession #:  [id number]     Weight:       137.8 lb Date of Birth:  22-Oct-1925      BSA:          1.632 m Patient Age:    58 years       BP:           144/56 mmHg Patient Gender: F              HR:           88 bpm. Exam Location:  Inpatient Procedure: 2D Echo, Cardiac Doppler and Color Doppler Indications:    CVA  History:        Patient has no prior history of Echocardiogram examinations.                 TIA; Signs/Symptoms:Shortness of Breath.  Sonographer:    Luisa Hart RDCS Referring Phys: [place]  1. Left ventricular ejection  fraction, by estimation, is 55 to 60%. The left ventricle has normal function. The left ventricle has no regional wall motion abnormalities. There is mild left ventricular hypertrophy. Left ventricular diastolic parameters are consistent with Grade I diastolic dysfunction (impaired relaxation).  2. Right ventricular systolic function is normal. The right ventricular size is normal. There is normal pulmonary artery systolic pressure. The estimated right ventricular systolic pressure is 0000000 mmHg.  3. The mitral valve is grossly normal. No evidence of mitral valve regurgitation.  4. The aortic valve is tricuspid. Aortic valve regurgitation is trivial. Mild to moderate aortic valve sclerosis/calcification is present, without any evidence of aortic stenosis. Aortic valve mean gradient measures 5.0 mmHg.  5. The inferior vena cava is normal in size with greater than 50% respiratory variability, suggesting right atrial pressure of 3 mmHg. Comparison(s): No prior Echocardiogram. FINDINGS  Left Ventricle: Left ventricular ejection fraction, by estimation, is 55 to 60%. The left ventricle has normal function. The left ventricle has no regional wall motion abnormalities. The left ventricular internal cavity size was normal in size. There is  mild left ventricular hypertrophy. Left ventricular diastolic parameters are consistent with Grade I diastolic dysfunction (impaired relaxation). Indeterminate filling pressures. Right Ventricle: The right ventricular size is normal. No increase in right ventricular wall thickness. Right ventricular systolic function is normal. There is normal pulmonary artery systolic pressure. The tricuspid regurgitant velocity is 2.55 m/s, and  with an assumed right atrial pressure of 3 mmHg, the estimated right ventricular systolic pressure is 0000000 mmHg. Left Atrium: Left atrial size was normal in size. Right Atrium: Right atrial size was normal in size. Pericardium: There is no evidence of  pericardial effusion. Mitral Valve: The mitral valve is grossly normal. No evidence of mitral valve regurgitation. Tricuspid Valve: The tricuspid valve is grossly normal. Tricuspid valve regurgitation is trivial. Aortic Valve: The aortic valve is tricuspid. Aortic valve regurgitation is trivial. Mild to moderate aortic valve sclerosis/calcification is present, without any evidence of aortic stenosis. Aortic valve mean gradient measures 5.0 mmHg. Aortic valve peak  gradient measures 9.0 mmHg. Aortic valve area, by VTI measures 2.00 cm. Pulmonic Valve: The pulmonic valve was grossly normal. Pulmonic valve regurgitation is trivial. Aorta: The aortic root and ascending aorta are structurally normal, with no evidence of dilitation. Venous: The inferior vena cava is normal in size with greater than 50% respiratory variability, suggesting right atrial pressure of 3 mmHg. IAS/Shunts: The interatrial septum was not well visualized.  LEFT VENTRICLE PLAX 2D LVIDd:  3.70 cm     Diastology LVIDs:         2.00 cm     LV e' medial:    4.13 cm/s LV PW:         1.20 cm     LV E/e' medial:  15.9 LV IVS:        1.20 cm     LV e' lateral:   7.51 cm/s LVOT diam:     2.30 cm     LV E/e' lateral: 8.7 LV SV:         43 LV SV Index:   26 LVOT Area:     4.15 cm  LV Volumes (MOD) LV vol d, MOD A2C: 43.0 ml LV vol d, MOD A4C: 47.1 ml LV vol s, MOD A2C: 31.0 ml LV vol s, MOD A4C: 20.6 ml LV SV MOD A2C:     12.0 ml LV SV MOD A4C:     47.1 ml LV SV MOD BP:      20.6 ml RIGHT VENTRICLE RV Basal diam:  3.00 cm RV Mid diam:    1.80 cm RV S prime:     12.60 cm/s TAPSE (M-mode): 1.9 cm LEFT ATRIUM             Index       RIGHT ATRIUM           Index LA diam:        3.10 cm 1.90 cm/m  RA Area:     17.30 cm LA Vol (A2C):   10.8 ml 6.62 ml/m  RA Volume:   48.70 ml  29.85 ml/m LA Vol (A4C):   26.8 ml 16.42 ml/m LA Biplane Vol: 17.3 ml 10.60 ml/m  AORTIC VALVE                    PULMONIC VALVE AV Area (Vmax):    1.93 cm     PV Vmax:           0.92 m/s AV Area (Vmean):   1.85 cm     PV Vmean:         60.300 cm/s AV Area (VTI):     2.00 cm     PV VTI:           0.179 m AV Vmax:           150.00 cm/s  PV Peak grad:     3.4 mmHg AV Vmean:          101.000 cm/s PV Mean grad:     2.0 mmHg AV VTI:            0.216 m      PR End Diast Vel: 6.97 msec AV Peak Grad:      9.0 mmHg AV Mean Grad:      5.0 mmHg LVOT Vmax:         69.50 cm/s LVOT Vmean:        44.900 cm/s LVOT VTI:          0.104 m LVOT/AV VTI ratio: 0.48 AR Vena Contracta: 0.20 cm  AORTA Ao Root diam: 3.70 cm Ao Asc diam:  3.20 cm MITRAL VALVE                TRICUSPID VALVE MV Area (PHT): 4.17 cm     TR Peak grad:   26.0 mmHg MV Decel Time: 182 msec     TR Vmax:  255.00 cm/s MV E velocity: 65.60 cm/s MV A velocity: 126.00 cm/s  SHUNTS MV E/A ratio:  0.52         Systemic VTI:  0.10 m                             Systemic Diam: 2.30 cm Lyman Bishop MD Electronically signed by Lyman Bishop MD Signature Date/Time: 08/17/2020/2:40:16 PM    Final    DG HIP OPERATIVE UNILAT W OR W/O PELVIS RIGHT  Result Date: 08/13/2020 CLINICAL DATA:  Surgery, elective Z41.9 (ICD-10-CM) EXAM: OPERATIVE RIGHT HIP (WITH PELVIS IF PERFORMED) 3 VIEWS TECHNIQUE: Fluoroscopic spot image(s) were submitted for interpretation post-operatively. COMPARISON:  08/12/2020. FINDINGS: Fluoro time: 26 seconds Reported radiation dose: 4.9 mGy Three C-arm fluoroscopic images were obtained intraoperatively and submitted for post operative interpretation. These images demonstrate intramedullary nail and screw fixation of an intertrochanteric right femur fracture with near anatomic alignment. Please see the performing provider's procedural report for further detail. IMPRESSION: Intraoperative fluoroscopy, as detailed above. Electronically Signed   By: Margaretha Sheffield MD   On: 08/13/2020 09:08   DG Hip Unilat  With Pelvis 2-3 Views Right  Result Date: 08/12/2020 CLINICAL DATA:  Acute RIGHT hip pain following fall. EXAM: DG HIP  (WITH OR WITHOUT PELVIS) 2-3V RIGHT COMPARISON:  None. FINDINGS: An intertrochanteric fracture of the proximal RIGHT femur is identified with slight varus angulation. No dislocation noted. Degenerative changes in the hips and LOWER lumbar spine are present. IMPRESSION: Intertrochanteric RIGHT femur fracture with slight varus angulation. Electronically Signed   By: Margarette Canada M.D.   On: 08/12/2020 11:44   CT HEAD CODE STROKE WO CONTRAST  Addendum Date: 08/15/2020   ADDENDUM REPORT: 08/15/2020 12:02 ADDENDUM: Following multiple attempts to reach the ordering provider, these results were discussed by telephone on 08/15/2020 at 11:58 pm with provider DAVID Sabino Gasser , who verbally acknowledged these results. Electronically Signed   By: Kellie Simmering DO   On: 08/15/2020 12:02   Result Date: 08/15/2020 CLINICAL DATA:  Code stroke. Neuro deficit, acute, stroke suspected. Additional provided: Fall a few days ago, right hip surgery, right facial droop. EXAM: CT HEAD WITHOUT CONTRAST TECHNIQUE: Contiguous axial images were obtained from the base of the skull through the vertex without intravenous contrast. COMPARISON:  Brain MRI 07/02/2019. MRA head 06/29/2019. Head CT 05/20/2019. FINDINGS: Brain: As before, there is lateral and third ventriculomegaly which is out of proportion to the degree of generalized cerebral atrophy, and suggestive of normal pressure hydrocephalus. Moderate/advanced patchy and confluent hypoattenuation within the cerebral white matter, nonspecific but compatible with chronic small vessel ischemic disease. There is no acute intracranial hemorrhage. No demarcated cortical infarct. No extra-axial fluid collection. No evidence of an intracranial mass. No midline shift. Vascular: No hyperdense vessel.  Atherosclerotic calcifications. Skull: Normal. Negative for fracture or focal lesion. Sinuses/Orbits: Visualized orbits show no acute finding. No significant paranasal sinus disease. Other: Prominent torus  palatinus. ASPECTS ([place] Stroke Program Early CT Score) - Ganglionic level infarction (caudate, lentiform nuclei, internal capsule, insula, M1-M3 cortex): 7 - Supraganglionic infarction (M4-M6 cortex): 3 Total score (0-10 with 10 being normal): 10 IMPRESSION: No acute intracranial hemorrhage or acute demarcated cortical infarction. ASPECTS is 10. As before, there is lateral and third ventriculomegaly which is out of proportion to the degree of generalized cerebral atrophy. 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 Moderate/advanced chronic small vessel ischemic changes within the cerebral white matter. Electronically Signed:  By: Kellie Simmering DO On: 08/15/2020 11:38    Assessment/Plan  1. Postoperative anemia due to acute blood loss -  hgb 7,6, dropped from 8.0 -  will increase FeSO4 325 mg daily to BID and start Vit C 500 mg BID for better absorption of iron  2. Closed nondisplaced intertrochanteric fracture of right femur, sequela -   S/P IM nail fixation on 08/13/20 -   WBAT -  continue PT and OT, for therapeutic strengthening exercises  3. History of TIA (transient ischemic attack) -  continue Plavix and ASA  4. Primary hypertension -  continue Amlodipine -  monitor BPs     Family/ staff Communication:   Discussed plan of care with resident and charge nurse.  Labs/tests ordered:  None  Goals of care:   Short-term care.   Durenda Age, DNP, MSN, FNP-BC [facility] [phone] (Monday-Friday 8:00 a.m. - 5:00 p.m.) [phone] (after hours)

## 2020-09-05 ENCOUNTER — Encounter: Payer: Self-pay | Admitting: Adult Health

## 2020-09-05 ENCOUNTER — Non-Acute Institutional Stay (SKILLED_NURSING_FACILITY): Payer: PPO | Admitting: Adult Health

## 2020-09-05 DIAGNOSIS — S72144S Nondisplaced intertrochanteric fracture of right femur, sequela: Secondary | ICD-10-CM

## 2020-09-05 DIAGNOSIS — R197 Diarrhea, unspecified: Secondary | ICD-10-CM

## 2020-09-05 NOTE — Progress Notes (Signed)
Location:  [facility] Room Number: [id number] Place of Service:  SNF (31) Provider:  Durenda Age, DNP, FNP-BC  Patient Care Team: [name], MD as PCP - General (Internal Medicine)  Extended Emergency Contact Information Primary Emergency Contact: [name] Address: [place] Phone: [phone] Mobile Phone: [phone] Relation: None  Code Status:  DNR  Goals of care: Advanced Directive information Advanced Directives 09/05/2020  Does Patient Have a Medical Advance Directive? Yes  Type of Paramedic of [place];Living will;Out of facility DNR (pink MOST or yellow form)  Does patient want to make changes to medical advance directive? No - Patient declined  Copy of [place] in Chart? No - copy requested     Chief Complaint  Patient presents with   Acute Visit    Diarrhea     HPI:  Pt is a 87 y.o. female seen today for a reported diarrhea. She is currently having a short-term rehabilitation. She was seen in her room with daughter-in-law at bedside. Patient stated that she had soft  stools X 3 since morning. She denies having abdominal pain. She is currently taking Miralax 17 gm daily and Senna-S 50-8.6 mg 2 tabs BID for constipation. She is currently having a short-term rehabilitation post hospitalization 08/12/20 to 08/18/20 S/P fall at home sustaining intact trochanteric right femur fracture with slight varus angulation for which she had IM nail fixation on 08/13/2020.   Past Medical History:  Diagnosis Date   Hearing loss    Hypothyroidism    Neutropenia ([place])    Osteopenia    Primary osteoarthritis    Tremor    Past Surgical History:  Procedure Laterality Date   INTRAMEDULLARY (IM) NAIL INTERTROCHANTERIC Right 08/13/2020   Procedure: INTRAMEDULLARY (IM) NAIL INTERTROCHANTRIC;  Surgeon: Gaynelle Arabian, MD;  Location: [facility];  Service:  Orthopedics;  Laterality: Right;    Allergies  Allergen Reactions   Codeine Other (See Comments)    Extreme headaches   Shellfish Allergy Hives   Shellfish-Derived Products Hives   Amoxicillin Itching and Rash    Outpatient Encounter Medications as of 09/05/2020  Medication Sig   amLODipine (NORVASC) 2.5 MG tablet Take 2.5 mg by mouth every evening.   Apoaequorin (PREVAGEN) 10 MG CAPS Take 1 capsule by mouth every morning.   aspirin EC 81 MG tablet Take 81 mg by mouth daily. Swallow whole.   bisacodyl (DULCOLAX) 10 MG suppository Place 10 mg rectally as needed for moderate constipation.   Cholecalciferol (VITAMIN D3) 50 MCG (2000 UT) TABS Take 2,000 Units by mouth daily.   clopidogrel (PLAVIX) 75 MG tablet Take 1 tablet (75 mg total) by mouth daily.   Emollient (CETAPHIL) cream Apply 1 application topically 2 (two) times daily.   ferrous sulfate 325 (65 FE) MG tablet Take 325 mg by mouth in the morning and at bedtime.   HYDROcodone-acetaminophen (NORCO/VICODIN) 5-325 MG tablet Take 1 tablet by mouth every 6 (six) hours as needed for moderate pain or severe pain (pain score 4-6).   levothyroxine (SYNTHROID) 125 MCG tablet Take 1 tablet (125 mcg total) by mouth daily before breakfast.   Magnesium Hydroxide (MILK OF MAGNESIA PO) Take 30 mLs by mouth as needed.   magnesium oxide (MAG-OX) 400 MG tablet Take 400 mg by mouth daily.   NON FORMULARY Diet:NAS   OXYGEN Inhale into the lungs.   polyethylene glycol (MIRALAX / GLYCOLAX)  17 g packet Take 17 g by mouth daily. in 6 oz of liquid   senna-docusate (SENOKOT-S) 8.6-50 MG tablet Take 2 tablets by mouth 2 (two) times daily.   Sodium Phosphates (RA SALINE ENEMA RE) Place 1 Dose rectally as needed.   vitamin B-12 (CYANOCOBALAMIN) 100 MCG tablet Take 100 mcg by mouth daily.   vitamin C (ASCORBIC ACID) 500 MG tablet Take 500 mg by mouth daily.   No facility-administered encounter medications on file as of 09/05/2020.    Review of  Systems  GENERAL: No change in appetite, no fatigue, no weight changes, no fever, chills or weakness MOUTH and THROAT: Denies oral discomfort, gingival pain or bleeding, RESPIRATORY: no cough, SOB, DOE, wheezing, hemoptysis CARDIAC: No chest pain, edema or palpitations GI: No abdominal pain, diarrhea, constipation, heart burn, nausea or vomiting GU: Denies dysuria, frequency, hematuria or discharge NEUROLOGICAL: Denies dizziness, syncope, numbness, or headache PSYCHIATRIC: Denies feelings of depression or anxiety. No report of hallucinations, insomnia, paranoia, or agitation   Immunization History  Administered Date(s) Administered   Influenza, High Dose Seasonal PF 10/08/2013, 10/13/2014, 10/12/2015   Influenza, Quadrivalent, Recombinant, Inj, Pf 11/06/2016, 11/25/2017   PFIZER(Purple Top)SARS-COV-2 Vaccination 02/17/2019, 03/10/2019   Pneumococcal Conjugate-13 12/02/2012   Pneumococcal Polysaccharide-23 05/21/2010   Tdap 07/11/2016   Pertinent  Health Maintenance Due  Topic Date Due   DEXA SCAN  Never done   INFLUENZA VACCINE  08/07/2020   PNA vac Low Risk Adult  Completed   Fall Risk  08/07/2018  Falls in the past year? 0  Comment Emmi Telephone Survey: data to providers prior to load     Vitals:   09/05/20 1618  BP: 110/64  Pulse: 72  Resp: 18  Temp: (!) 97.1 F (36.2 C)  Weight: 118 lb (53.5 kg)  Height: '5\' 2"'$  (1.575 m)   Body mass index is 21.58 kg/m.  Physical Exam  GENERAL APPEARANCE: Well nourished. In no acute distress. Normal body habitus SKIN:  Skin is warm and dry.  MOUTH and THROAT: Lips are without lesions. Oral mucosa is moist and without lesions.  RESPIRATORY: Breathing is even & unlabored, BS CTAB CARDIAC: RRR, no murmur,no extra heart sounds, no edema GI: Abdomen soft, normal BS, no masses, no tenderness NEUROLOGICAL: There is no tremor. Speech is clear. PSYCHIATRIC:  Affect and behavior are appropriate  Labs reviewed: Recent Labs     08/15/20 0331 08/16/20 0358 08/17/20 0348  NA 131* 135 134*  K 3.9 3.6 3.7  CL 97* 101 100  CO2 28 32 28  GLUCOSE 94 90 99  BUN '13 8 12  '$ CREATININE 0.58 0.49 0.53  CALCIUM 8.1* 8.3* 8.3*  MG 1.9 2.0 2.1   No results for input(s): AST, ALT, ALKPHOS, BILITOT, PROT, ALBUMIN in the last 8760 hours. Recent Labs    08/15/20 0331 08/16/20 0358 08/17/20 0348  WBC 4.1 3.8* 4.4  NEUTROABS 1.6* 1.2* 1.7  HGB 8.1* 8.1* 8.0*  HCT 25.0* 25.1* 25.4*  MCV 87.7 88.1 88.8  PLT 222 244 301   Lab Results  Component Value Date   TSH 24.653 (H) 08/13/2020   Lab Results  Component Value Date   HGBA1C 5.8 (H) 08/15/2020   Lab Results  Component Value Date   CHOL 132 08/15/2020   HDL 50 08/15/2020   LDLCALC 69 08/15/2020   TRIG 64 08/15/2020   CHOLHDL 2.6 08/15/2020    Significant Diagnostic Results in last 30 days:  CT ANGIO HEAD W OR WO CONTRAST  Result Date:  08/15/2020 CLINICAL DATA:  Neuro deficit, acute, stroke suspected facial droop, slurred speech; Neuro deficit, acute, stroke suspected Facial droop, slurred speech EXAM: CT ANGIOGRAPHY HEAD AND NECK TECHNIQUE: Multidetector CT imaging of the head and neck was performed using the standard protocol during bolus administration of intravenous contrast. Multiplanar CT image reconstructions and MIPs were obtained to evaluate the vascular anatomy. Carotid stenosis measurements (when applicable) are obtained utilizing NASCET criteria, using the distal internal carotid diameter as the denominator. CONTRAST:  43m OMNIPAQUE IOHEXOL 350 MG/ML SOLN COMPARISON:  None. FINDINGS: CTA NECK FINDINGS Aortic arch: Atherosclerosis of the aorta and the great vessels. Great vessel origins are patent. Right carotid system: Patent. No significant (greater than 50%) stenosis. Moderate calcific and noncalcific atherosclerosis at the carotid bifurcation. Tortuous common and internal carotid arteries. Left carotid system: Patent. Moderate predominantly calcific  atherosclerosis at the carotid bifurcation without greater than 50% stenosis. Tortuous common and internal carotid arteries. Vertebral arteries: Patent. Moderate stenosis of the left vertebral artery origin. Mildly left dominant. Tortuous bilaterally. Skeleton: Severe degenerative disc disease at C6-C7 where there is marked disc height loss, endplate sclerosis and posterior disc osteophyte complex. Other neck: No acute abnormality. Upper chest: Motion limited evaluation without consolidation in the visualized lung apices. Review of the MIP images confirms the above findings CTA HEAD FINDINGS Anterior circulation: Moderate calcific atherosclerosis of bilateral intracranial ICAs. Moderate right and mild left paraclinoid ICA stenosis. Bilateral M1 and proximal M2 MCA branches are patent without proximal hemodynamically significant stenosis. Bilateral ACAs are patent. 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 Posterior circulation: Bilateral intradural vertebral arteries, basilar artery, and posterior cerebral arteries are patent without proximal hemodynamically significant stenosis. No aneurysm identified. Venous sinuses: As permitted by contrast timing, patent. Review of the MIP images confirms the above findings IMPRESSION: CTA head: 1. No large vessel occlusion. 2. Moderate right and mild left paraclinoid ICA stenosis. 3. 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 CTA neck: 1. Moderate stenosis of the left vertebral artery origin. 2. Moderate bilateral carotid bifurcation atherosclerosis without greater than 50% stenosis. Electronically Signed   By: FMargaretha SheffieldMD   On: 08/15/2020 19:49   DG Abd 1 View  Result Date: 08/17/2020 CLINICAL DATA:  Nausea and vomiting. EXAM: ABDOMEN - 1 VIEW COMPARISON:  None. FINDINGS: Bowel gas pattern is within normal limits. There does not appear to be an abnormal amount of fecal matter. No abnormal soft tissue calcifications other  than atherosclerotic vascular calcification. Previous ORIF for basicervical right femoral neck fracture. Curvature and chronic degenerative change of the spine. IMPRESSION: Gas pattern within normal limits. There does not appear to be an abnormal amount of fecal matter. Electronically Signed   By: MNelson ChimesM.D.   On: 08/17/2020 16:26   CT ANGIO NECK W OR WO CONTRAST  Result Date: 08/15/2020 CLINICAL DATA:  Neuro deficit, acute, stroke suspected facial droop, slurred speech; Neuro deficit, acute, stroke suspected Facial droop, slurred speech EXAM: CT ANGIOGRAPHY HEAD AND NECK TECHNIQUE: Multidetector CT imaging of the head and neck was performed using the standard protocol during bolus administration of intravenous contrast. Multiplanar CT image reconstructions and MIPs were obtained to evaluate the vascular anatomy. Carotid stenosis measurements (when applicable) are obtained utilizing NASCET criteria, using the distal internal carotid diameter as the denominator. CONTRAST:  725mOMNIPAQUE IOHEXOL 350 MG/ML SOLN COMPARISON:  None. FINDINGS: CTA NECK FINDINGS Aortic arch: Atherosclerosis of the aorta and the great vessels. Great vessel origins are patent. Right carotid  system: Patent. No significant (greater than 50%) stenosis. Moderate calcific and noncalcific atherosclerosis at the carotid bifurcation. Tortuous common and internal carotid arteries. Left carotid system: Patent. Moderate predominantly calcific atherosclerosis at the carotid bifurcation without greater than 50% stenosis. Tortuous common and internal carotid arteries. Vertebral arteries: Patent. Moderate stenosis of the left vertebral artery origin. Mildly left dominant. Tortuous bilaterally. Skeleton: Severe degenerative disc disease at C6-C7 where there is marked disc height loss, endplate sclerosis and posterior disc osteophyte complex. Other neck: No acute abnormality. Upper chest: Motion limited evaluation without consolidation in the  visualized lung apices. Review of the MIP images confirms the above findings CTA HEAD FINDINGS Anterior circulation: Moderate calcific atherosclerosis of bilateral intracranial ICAs. Moderate right and mild left paraclinoid ICA stenosis. Bilateral M1 and proximal M2 MCA branches are patent without proximal hemodynamically significant stenosis. Bilateral ACAs are patent. 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 Posterior circulation: Bilateral intradural vertebral arteries, basilar artery, and posterior cerebral arteries are patent without proximal hemodynamically significant stenosis. No aneurysm identified. Venous sinuses: As permitted by contrast timing, patent. Review of the MIP images confirms the above findings IMPRESSION: CTA head: 1. No large vessel occlusion. 2. Moderate right and mild left paraclinoid ICA stenosis. 3. 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 CTA neck: 1. Moderate stenosis of the left vertebral artery origin. 2. Moderate bilateral carotid bifurcation atherosclerosis without greater than 50% stenosis. Electronically Signed   By: Margaretha Sheffield MD   On: 08/15/2020 19:49   MR BRAIN WO CONTRAST  Result Date: 08/16/2020 CLINICAL DATA:  Neuro deficit, acute, stroke suspected; facial droop, slurred speech. Additional history provided: Status post hip surgery August 7th, developed acute onset mild right facial droop and dysarthria 08/15/2020. Evaluate for possible CVA. EXAM: MRI HEAD WITHOUT CONTRAST TECHNIQUE: Multiplanar, multiecho pulse sequences of the brain and surrounding structures were obtained without intravenous contrast. COMPARISON:  Noncontrast head CT and CT angiogram head/neck 08/15/2020. MRI brain 07/02/2019. FINDINGS: Brain: Generalized cerebral and cerebellar atrophy. As before, there is lateral and third ventriculomegaly which appears out proportion to the degree of generalized cerebral volume loss. These findings are suggestive  of normal pressure hydrocephalus. Advanced patchy and confluent T2/FLAIR hyperintensity within the cerebral white matter, nonspecific but compatible with chronic small vessel ischemic disease. There is no acute infarct. No evidence of an intracranial mass. No chronic intracranial blood products. No extra-axial fluid collection. No midline shift. Vascular: Maintained proximal large arterial flow voids. Skull and upper cervical spine: Focal suspicious marrow lesion. Sinuses/Orbits: Visualized orbits show no acute finding. Leftward gaze no significant paranasal sinus disease. IMPRESSION: No evidence of acute intracranial abnormality. As before, there is lateral and third ventriculomegaly which appears out of proportion to the degree of generalized cerebral volume loss. These findings suggest normal pressure hydrocephalus. Advanced chronic small vessel ischemic changes within the cerebral white matter. Electronically Signed   By: Kellie Simmering DO   On: 08/16/2020 07:53   DG C-Arm 1-60 Min-No Report  Result Date: 08/13/2020 Fluoroscopy was utilized by the requesting physician.  No radiographic interpretation.   ECHOCARDIOGRAM COMPLETE  Result Date: 08/17/2020    ECHOCARDIOGRAM REPORT   Patient Name:   Tanya [name] Date of Exam: 08/17/2020 Medical Rec #:  [id number]      Height:       62.0 in Accession #:    [id number]     Weight:       137.8 lb Date of Birth:  02-27-1925  BSA:          1.632 m Patient Age:    30 years       BP:           144/56 mmHg Patient Gender: F              HR:           88 bpm. Exam Location:  Inpatient Procedure: 2D Echo, Cardiac Doppler and Color Doppler Indications:    CVA  History:        Patient has no prior history of Echocardiogram examinations.                 TIA; Signs/Symptoms:Shortness of Breath.  Sonographer:    Luisa Hart RDCS Referring Phys: [place]  1. Left ventricular ejection fraction, by estimation, is 55 to 60%. The left ventricle has normal  function. The left ventricle has no regional wall motion abnormalities. There is mild left ventricular hypertrophy. Left ventricular diastolic parameters are consistent with Grade I diastolic dysfunction (impaired relaxation).  2. Right ventricular systolic function is normal. The right ventricular size is normal. There is normal pulmonary artery systolic pressure. The estimated right ventricular systolic pressure is 0000000 mmHg.  3. The mitral valve is grossly normal. No evidence of mitral valve regurgitation.  4. The aortic valve is tricuspid. Aortic valve regurgitation is trivial. Mild to moderate aortic valve sclerosis/calcification is present, without any evidence of aortic stenosis. Aortic valve mean gradient measures 5.0 mmHg.  5. The inferior vena cava is normal in size with greater than 50% respiratory variability, suggesting right atrial pressure of 3 mmHg. Comparison(s): No prior Echocardiogram. FINDINGS  Left Ventricle: Left ventricular ejection fraction, by estimation, is 55 to 60%. The left ventricle has normal function. The left ventricle has no regional wall motion abnormalities. The left ventricular internal cavity size was normal in size. There is  mild left ventricular hypertrophy. Left ventricular diastolic parameters are consistent with Grade I diastolic dysfunction (impaired relaxation). Indeterminate filling pressures. Right Ventricle: The right ventricular size is normal. No increase in right ventricular wall thickness. Right ventricular systolic function is normal. There is normal pulmonary artery systolic pressure. The tricuspid regurgitant velocity is 2.55 m/s, and  with an assumed right atrial pressure of 3 mmHg, the estimated right ventricular systolic pressure is 0000000 mmHg. Left Atrium: Left atrial size was normal in size. Right Atrium: Right atrial size was normal in size. Pericardium: There is no evidence of pericardial effusion. Mitral Valve: The mitral valve is grossly normal. No  evidence of mitral valve regurgitation. Tricuspid Valve: The tricuspid valve is grossly normal. Tricuspid valve regurgitation is trivial. Aortic Valve: The aortic valve is tricuspid. Aortic valve regurgitation is trivial. Mild to moderate aortic valve sclerosis/calcification is present, without any evidence of aortic stenosis. Aortic valve mean gradient measures 5.0 mmHg. Aortic valve peak  gradient measures 9.0 mmHg. Aortic valve area, by VTI measures 2.00 cm. Pulmonic Valve: The pulmonic valve was grossly normal. Pulmonic valve regurgitation is trivial. Aorta: The aortic root and ascending aorta are structurally normal, with no evidence of dilitation. Venous: The inferior vena cava is normal in size with greater than 50% respiratory variability, suggesting right atrial pressure of 3 mmHg. IAS/Shunts: The interatrial septum was not well visualized.  LEFT VENTRICLE PLAX 2D LVIDd:         3.70 cm     Diastology LVIDs:         2.00 cm  LV e' medial:    4.13 cm/s LV PW:         1.20 cm     LV E/e' medial:  15.9 LV IVS:        1.20 cm     LV e' lateral:   7.51 cm/s LVOT diam:     2.30 cm     LV E/e' lateral: 8.7 LV SV:         43 LV SV Index:   26 LVOT Area:     4.15 cm  LV Volumes (MOD) LV vol d, MOD A2C: 43.0 ml LV vol d, MOD A4C: 47.1 ml LV vol s, MOD A2C: 31.0 ml LV vol s, MOD A4C: 20.6 ml LV SV MOD A2C:     12.0 ml LV SV MOD A4C:     47.1 ml LV SV MOD BP:      20.6 ml RIGHT VENTRICLE RV Basal diam:  3.00 cm RV Mid diam:    1.80 cm RV S prime:     12.60 cm/s TAPSE (M-mode): 1.9 cm LEFT ATRIUM             Index       RIGHT ATRIUM           Index LA diam:        3.10 cm 1.90 cm/m  RA Area:     17.30 cm LA Vol (A2C):   10.8 ml 6.62 ml/m  RA Volume:   48.70 ml  29.85 ml/m LA Vol (A4C):   26.8 ml 16.42 ml/m LA Biplane Vol: 17.3 ml 10.60 ml/m  AORTIC VALVE                    PULMONIC VALVE AV Area (Vmax):    1.93 cm     PV Vmax:          0.92 m/s AV Area (Vmean):   1.85 cm     PV Vmean:         60.300 cm/s AV  Area (VTI):     2.00 cm     PV VTI:           0.179 m AV Vmax:           150.00 cm/s  PV Peak grad:     3.4 mmHg AV Vmean:          101.000 cm/s PV Mean grad:     2.0 mmHg AV VTI:            0.216 m      PR End Diast Vel: 6.97 msec AV Peak Grad:      9.0 mmHg AV Mean Grad:      5.0 mmHg LVOT Vmax:         69.50 cm/s LVOT Vmean:        44.900 cm/s LVOT VTI:          0.104 m LVOT/AV VTI ratio: 0.48 AR Vena Contracta: 0.20 cm  AORTA Ao Root diam: 3.70 cm Ao Asc diam:  3.20 cm MITRAL VALVE                TRICUSPID VALVE MV Area (PHT): 4.17 cm     TR Peak grad:   26.0 mmHg MV Decel Time: 182 msec     TR Vmax:        255.00 cm/s MV E velocity: 65.60 cm/s MV A velocity: 126.00 cm/s  SHUNTS MV E/A ratio:  0.52  Systemic VTI:  0.10 m                             Systemic Diam: 2.30 cm Lyman Bishop MD Electronically signed by Lyman Bishop MD Signature Date/Time: 08/17/2020/2:40:16 PM    Final    DG HIP OPERATIVE UNILAT W OR W/O PELVIS RIGHT  Result Date: 08/13/2020 CLINICAL DATA:  Surgery, elective Z41.9 (ICD-10-CM) EXAM: OPERATIVE RIGHT HIP (WITH PELVIS IF PERFORMED) 3 VIEWS TECHNIQUE: Fluoroscopic spot image(s) were submitted for interpretation post-operatively. COMPARISON:  08/12/2020. FINDINGS: Fluoro time: 26 seconds Reported radiation dose: 4.9 mGy Three C-arm fluoroscopic images were obtained intraoperatively and submitted for post operative interpretation. These images demonstrate intramedullary nail and screw fixation of an intertrochanteric right femur fracture with near anatomic alignment. Please see the performing provider's procedural report for further detail. IMPRESSION: Intraoperative fluoroscopy, as detailed above. Electronically Signed   By: Margaretha Sheffield MD   On: 08/13/2020 09:08   DG Hip Unilat  With Pelvis 2-3 Views Right  Result Date: 08/12/2020 CLINICAL DATA:  Acute RIGHT hip pain following fall. EXAM: DG HIP (WITH OR WITHOUT PELVIS) 2-3V RIGHT COMPARISON:  None. FINDINGS: An  intertrochanteric fracture of the proximal RIGHT femur is identified with slight varus angulation. No dislocation noted. Degenerative changes in the hips and LOWER lumbar spine are present. IMPRESSION: Intertrochanteric RIGHT femur fracture with slight varus angulation. Electronically Signed   By: Margarette Canada M.D.   On: 08/12/2020 11:44   CT HEAD CODE STROKE WO CONTRAST  Addendum Date: 08/15/2020   ADDENDUM REPORT: 08/15/2020 12:02 ADDENDUM: Following multiple attempts to reach the ordering provider, these results were discussed by telephone on 08/15/2020 at 11:58 pm with provider DAVID Sabino Gasser , who verbally acknowledged these results. Electronically Signed   By: Kellie Simmering DO   On: 08/15/2020 12:02   Result Date: 08/15/2020 CLINICAL DATA:  Code stroke. Neuro deficit, acute, stroke suspected. Additional provided: Fall a few days ago, right hip surgery, right facial droop. EXAM: CT HEAD WITHOUT CONTRAST TECHNIQUE: Contiguous axial images were obtained from the base of the skull through the vertex without intravenous contrast. COMPARISON:  Brain MRI 07/02/2019. MRA head 06/29/2019. Head CT 05/20/2019. FINDINGS: Brain: As before, there is lateral and third ventriculomegaly which is out of proportion to the degree of generalized cerebral atrophy, and suggestive of normal pressure hydrocephalus. Moderate/advanced patchy and confluent hypoattenuation within the cerebral white matter, nonspecific but compatible with chronic small vessel ischemic disease. There is no acute intracranial hemorrhage. No demarcated cortical infarct. No extra-axial fluid collection. No evidence of an intracranial mass. No midline shift. Vascular: No hyperdense vessel.  Atherosclerotic calcifications. Skull: Normal. Negative for fracture or focal lesion. Sinuses/Orbits: Visualized orbits show no acute finding. No significant paranasal sinus disease. Other: Prominent torus palatinus. ASPECTS ([place] Stroke Program Early CT Score) -  Ganglionic level infarction (caudate, lentiform nuclei, internal capsule, insula, M1-M3 cortex): 7 - Supraganglionic infarction (M4-M6 cortex): 3 Total score (0-10 with 10 being normal): 10 IMPRESSION: No acute intracranial hemorrhage or acute demarcated cortical infarction. ASPECTS is 10. As before, there is lateral and third ventriculomegaly which is out of proportion to the degree of generalized cerebral atrophy. 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 Moderate/advanced chronic small vessel ischemic changes within the cerebral white matter. Electronically Signed: By: Kellie Simmering DO On: 08/15/2020 11:38    Assessment/Plan  1. Diarrhea, unspecified type -   thought to be due to Senna-S and  miralax -   will decrease Senna- S 50-8.6 mg from 2 tabs BID to 1 tab daily and continue Miralax 17 gm daily -   will do stool culture for C-Diffille  2. Closed nondisplaced intertrochanteric fracture of right femur, sequela -  S/P IM nail on 08/13/20, continue PT and OT for therapeutic strengthening     Family/ staff Communication:   Labs/tests ordered:   Stool culture for C. difficile, BMP and CBC  Goals of care:   Short-term care   Durenda Age, DNP, MSN, FNP-BC [facility] [phone] (Monday-Friday 8:00 a.m. - 5:00 p.m.) [phone] (after hours)

## 2020-09-07 ENCOUNTER — Non-Acute Institutional Stay (SKILLED_NURSING_FACILITY): Payer: PPO | Admitting: Adult Health

## 2020-09-07 ENCOUNTER — Encounter: Payer: Self-pay | Admitting: Adult Health

## 2020-09-07 DIAGNOSIS — Z8673 Personal history of transient ischemic attack (TIA), and cerebral infarction without residual deficits: Secondary | ICD-10-CM | POA: Diagnosis not present

## 2020-09-07 DIAGNOSIS — I10 Essential (primary) hypertension: Secondary | ICD-10-CM

## 2020-09-07 DIAGNOSIS — D62 Acute posthemorrhagic anemia: Secondary | ICD-10-CM

## 2020-09-07 DIAGNOSIS — S72144S Nondisplaced intertrochanteric fracture of right femur, sequela: Secondary | ICD-10-CM

## 2020-09-07 DIAGNOSIS — E039 Hypothyroidism, unspecified: Secondary | ICD-10-CM

## 2020-09-07 NOTE — Progress Notes (Signed)
Location:  [facility] Room Number: [id number] Place of Service:  SNF (31) Provider:  Durenda Age, DNP, FNP-BC  Patient Care Team: [name], MD as PCP - General (Internal Medicine)  Extended Emergency Contact Information Primary Emergency Contact: [name] Address: [place] Phone: [phone] Mobile Phone: [phone] Relation: None  Code Status: DNR   Goals of care: Advanced Directive information Advanced Directives 09/05/2020  Does Patient Have a Medical Advance Directive? Yes  Type of Paramedic of [place];Living will;Out of facility DNR (pink MOST or yellow form)  Does patient want to make changes to medical advance directive? No - Patient declined  Copy of [place] in Chart? No - copy requested     Chief Complaint  Patient presents with   Discharge Note    HPI:  Pt is a 87 y.o. female who is for discharge home on 09/09/20 with Home health PT, OT and Aide.  She was admitted to [facility] on 08/18/2020 post hospitalization 08/12/20 to 08/18/2020.  She has a PMH of TIA, vitamin B12 deficiency and mild cognitive impairment.  She fell at home sustaining intratrochanteric right femur fracture with slight varus angulation for which she had IM nail fixation on 08/13/2020.  She has been recovering as expected until 08/15/20, when she developed acute onset mild right facial droop and dysarthria.  A code stroke was initiated and was evaluated tele neurology.  CT head was negative for acute infarct.  MRI of the brain does not show any acute infarct.  CT angiogram of the head and neck does show some blockages in the vertebral and carotid vessels.  Neurology recommended to start Plavix and to continue aspirin.  Patient was admitted to this facility for short-term rehabilitation after the patient's recent hospitalization.  Patient  has completed SNF rehabilitation and therapy has cleared the patient for discharge.   Past Medical History:  Diagnosis Date   Hearing loss    Hypothyroidism    Neutropenia ([place])    Osteopenia    Primary osteoarthritis    Tremor    Past Surgical History:  Procedure Laterality Date   INTRAMEDULLARY (IM) NAIL INTERTROCHANTERIC Right 08/13/2020   Procedure: INTRAMEDULLARY (IM) NAIL INTERTROCHANTRIC;  Surgeon: Gaynelle Arabian, MD;  Location: [facility];  Service: Orthopedics;  Laterality: Right;    Allergies  Allergen Reactions   Codeine Other (See Comments)    Extreme headaches   Shellfish Allergy Hives   Shellfish-Derived Products Hives   Amoxicillin Itching and Rash    Outpatient Encounter Medications as of 09/07/2020  Medication Sig   amLODipine (NORVASC) 2.5 MG tablet Take 2.5 mg by mouth every evening.   Apoaequorin (PREVAGEN) 10 MG CAPS Take 1 capsule by mouth every morning.   aspirin EC 81 MG tablet Take 81 mg by mouth daily. Swallow whole.   bisacodyl (DULCOLAX) 10 MG suppository Place 10 mg rectally as needed for moderate constipation.   Cholecalciferol (VITAMIN D3) 50 MCG (2000 UT) TABS Take 2,000 Units by mouth daily.   clopidogrel (PLAVIX) 75 MG tablet Take 1 tablet (75 mg total) by mouth daily.   Emollient (CETAPHIL) cream Apply 1 application topically 2 (two) times daily.   ferrous sulfate 325 (65 FE) MG tablet Take 325 mg by mouth in the morning and at bedtime.   HYDROcodone-acetaminophen (NORCO/VICODIN) 5-325 MG tablet Take 1 tablet by mouth every 6 (six) hours  as needed for moderate pain or severe pain (pain score 4-6).   levothyroxine (SYNTHROID) 125 MCG tablet Take 1 tablet (125 mcg total) by mouth daily before breakfast.   Magnesium Hydroxide (MILK OF MAGNESIA PO) Take 30 mLs by mouth as needed.   magnesium oxide (MAG-OX) 400 MG tablet Take 400 mg by mouth daily.   NON FORMULARY Diet:NAS   OXYGEN Inhale into the lungs.   polyethylene glycol (MIRALAX / GLYCOLAX) 17 g  packet Take 17 g by mouth daily. in 6 oz of liquid   senna-docusate (SENOKOT-S) 8.6-50 MG tablet Take 2 tablets by mouth 2 (two) times daily.   Sodium Phosphates (RA SALINE ENEMA RE) Place 1 Dose rectally as needed.   vitamin B-12 (CYANOCOBALAMIN) 100 MCG tablet Take 100 mcg by mouth daily.   vitamin C (ASCORBIC ACID) 500 MG tablet Take 500 mg by mouth daily.   No facility-administered encounter medications on file as of 09/07/2020.    Review of Systems  GENERAL: No change in appetite, no fatigue, no weight changes, no fever, chills or weakness MOUTH and THROAT: Denies oral discomfort, gingival pain or bleeding RESPIRATORY: no cough, SOB, DOE, wheezing, hemoptysis CARDIAC: No chest pain, edema or palpitations GI: No abdominal pain, diarrhea, constipation, heart burn, nausea or vomiting GU: Denies dysuria, frequency, hematuria or discharge NEUROLOGICAL: Denies dizziness, syncope, numbness, or headache PSYCHIATRIC: Denies feelings of depression or anxiety. No report of hallucinations, insomnia, paranoia, or agitation   Immunization History  Administered Date(s) Administered   Influenza, High Dose Seasonal PF 10/08/2013, 10/13/2014, 10/12/2015   Influenza, Quadrivalent, Recombinant, Inj, Pf 11/06/2016, 11/25/2017   PFIZER(Purple Top)SARS-COV-2 Vaccination 02/17/2019, 03/10/2019   Pneumococcal Conjugate-13 12/02/2012   Pneumococcal Polysaccharide-23 05/21/2010   Tdap 07/11/2016   Pertinent  Health Maintenance Due  Topic Date Due   DEXA SCAN  Never done   INFLUENZA VACCINE  08/07/2020   PNA vac Low Risk Adult  Completed   Fall Risk  08/07/2018  Falls in the past year? 0  Comment Emmi Telephone Survey: data to providers prior to load     Vitals:   09/07/20 1546  BP: 113/66  Pulse: 80  Resp: 18  Temp: (!) 97.4 F (36.3 C)  Weight: 118 lb (53.5 kg)  Height: '5\' 2"'$  (1.575 m)   Body mass index is 21.58 kg/m.  Physical Exam  GENERAL APPEARANCE: Well nourished. In no acute  distress. Normal body habitus SKIN:  Skin is warm and dry.  MOUTH and THROAT: Lips are without lesions. Oral mucosa is moist and without lesions.  RESPIRATORY: Breathing is even & unlabored, BS CTAB CARDIAC: RRR, no murmur,no extra heart sounds, no edema GI: Abdomen soft, normal BS, no masses, no tenderness, EXTREMITIES:  Able to move X 4 extremities. NEUROLOGICAL: There is no tremor. Speech is clear. Alert to self and place, disoriented to time. PSYCHIATRIC:  Affect and behavior are appropriate  Labs reviewed: Recent Labs    08/15/20 0331 08/16/20 0358 08/17/20 0348  NA 131* 135 134*  K 3.9 3.6 3.7  CL 97* 101 100  CO2 28 32 28  GLUCOSE 94 90 99  BUN '13 8 12  '$ CREATININE 0.58 0.49 0.53  CALCIUM 8.1* 8.3* 8.3*  MG 1.9 2.0 2.1   No results for input(s): AST, ALT, ALKPHOS, BILITOT, PROT, ALBUMIN in the last 8760 hours. Recent Labs    08/15/20 0331 08/16/20 0358 08/17/20 0348  WBC 4.1 3.8* 4.4  NEUTROABS 1.6* 1.2* 1.7  HGB 8.1* 8.1* 8.0*  HCT 25.0* 25.1*  25.4*  MCV 87.7 88.1 88.8  PLT 222 244 301   Lab Results  Component Value Date   TSH 24.653 (H) 08/13/2020   Lab Results  Component Value Date   HGBA1C 5.8 (H) 08/15/2020   Lab Results  Component Value Date   CHOL 132 08/15/2020   HDL 50 08/15/2020   LDLCALC 69 08/15/2020   TRIG 64 08/15/2020   CHOLHDL 2.6 08/15/2020    Significant Diagnostic Results in last 30 days:  CT ANGIO HEAD W OR WO CONTRAST  Result Date: 08/15/2020 CLINICAL DATA:  Neuro deficit, acute, stroke suspected facial droop, slurred speech; Neuro deficit, acute, stroke suspected Facial droop, slurred speech EXAM: CT ANGIOGRAPHY HEAD AND NECK TECHNIQUE: Multidetector CT imaging of the head and neck was performed using the standard protocol during bolus administration of intravenous contrast. Multiplanar CT image reconstructions and MIPs were obtained to evaluate the vascular anatomy. Carotid stenosis measurements (when applicable) are obtained  utilizing NASCET criteria, using the distal internal carotid diameter as the denominator. CONTRAST:  1m OMNIPAQUE IOHEXOL 350 MG/ML SOLN COMPARISON:  None. FINDINGS: CTA NECK FINDINGS Aortic arch: Atherosclerosis of the aorta and the great vessels. Great vessel origins are patent. Right carotid system: Patent. No significant (greater than 50%) stenosis. Moderate calcific and noncalcific atherosclerosis at the carotid bifurcation. Tortuous common and internal carotid arteries. Left carotid system: Patent. Moderate predominantly calcific atherosclerosis at the carotid bifurcation without greater than 50% stenosis. Tortuous common and internal carotid arteries. Vertebral arteries: Patent. Moderate stenosis of the left vertebral artery origin. Mildly left dominant. Tortuous bilaterally. Skeleton: Severe degenerative disc disease at C6-C7 where there is marked disc height loss, endplate sclerosis and posterior disc osteophyte complex. Other neck: No acute abnormality. Upper chest: Motion limited evaluation without consolidation in the visualized lung apices. Review of the MIP images confirms the above findings CTA HEAD FINDINGS Anterior circulation: Moderate calcific atherosclerosis of bilateral intracranial ICAs. Moderate right and mild left paraclinoid ICA stenosis. Bilateral M1 and proximal M2 MCA branches are patent without proximal hemodynamically significant stenosis. Bilateral ACAs are patent. 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 Posterior circulation: Bilateral intradural vertebral arteries, basilar artery, and posterior cerebral arteries are patent without proximal hemodynamically significant stenosis. No aneurysm identified. Venous sinuses: As permitted by contrast timing, patent. Review of the MIP images confirms the above findings IMPRESSION: CTA head: 1. No large vessel occlusion. 2. Moderate right and mild left paraclinoid ICA stenosis. 3. 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 CTA neck: 1. Moderate stenosis of the left vertebral artery origin. 2. Moderate bilateral carotid bifurcation atherosclerosis without greater than 50% stenosis. Electronically Signed   By: FMargaretha SheffieldMD   On: 08/15/2020 19:49   DG Abd 1 View  Result Date: 08/17/2020 CLINICAL DATA:  Nausea and vomiting. EXAM: ABDOMEN - 1 VIEW COMPARISON:  None. FINDINGS: Bowel gas pattern is within normal limits. There does not appear to be an abnormal amount of fecal matter. No abnormal soft tissue calcifications other than atherosclerotic vascular calcification. Previous ORIF for basicervical right femoral neck fracture. Curvature and chronic degenerative change of the spine. IMPRESSION: Gas pattern within normal limits. There does not appear to be an abnormal amount of fecal matter. Electronically Signed   By: MNelson ChimesM.D.   On: 08/17/2020 16:26   CT ANGIO NECK W OR WO CONTRAST  Result Date: 08/15/2020 CLINICAL DATA:  Neuro deficit, acute, stroke suspected facial droop, slurred speech; Neuro deficit, acute, stroke suspected Facial droop,  slurred speech EXAM: CT ANGIOGRAPHY HEAD AND NECK TECHNIQUE: Multidetector CT imaging of the head and neck was performed using the standard protocol during bolus administration of intravenous contrast. Multiplanar CT image reconstructions and MIPs were obtained to evaluate the vascular anatomy. Carotid stenosis measurements (when applicable) are obtained utilizing NASCET criteria, using the distal internal carotid diameter as the denominator. CONTRAST:  5m OMNIPAQUE IOHEXOL 350 MG/ML SOLN COMPARISON:  None. FINDINGS: CTA NECK FINDINGS Aortic arch: Atherosclerosis of the aorta and the great vessels. Great vessel origins are patent. Right carotid system: Patent. No significant (greater than 50%) stenosis. Moderate calcific and noncalcific atherosclerosis at the carotid bifurcation. Tortuous common and internal carotid arteries. Left carotid system:  Patent. Moderate predominantly calcific atherosclerosis at the carotid bifurcation without greater than 50% stenosis. Tortuous common and internal carotid arteries. Vertebral arteries: Patent. Moderate stenosis of the left vertebral artery origin. Mildly left dominant. Tortuous bilaterally. Skeleton: Severe degenerative disc disease at C6-C7 where there is marked disc height loss, endplate sclerosis and posterior disc osteophyte complex. Other neck: No acute abnormality. Upper chest: Motion limited evaluation without consolidation in the visualized lung apices. Review of the MIP images confirms the above findings CTA HEAD FINDINGS Anterior circulation: Moderate calcific atherosclerosis of bilateral intracranial ICAs. Moderate right and mild left paraclinoid ICA stenosis. Bilateral M1 and proximal M2 MCA branches are patent without proximal hemodynamically significant stenosis. Bilateral ACAs are patent. 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 Posterior circulation: Bilateral intradural vertebral arteries, basilar artery, and posterior cerebral arteries are patent without proximal hemodynamically significant stenosis. No aneurysm identified. Venous sinuses: As permitted by contrast timing, patent. Review of the MIP images confirms the above findings IMPRESSION: CTA head: 1. No large vessel occlusion. 2. Moderate right and mild left paraclinoid ICA stenosis. 3. 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 CTA neck: 1. Moderate stenosis of the left vertebral artery origin. 2. Moderate bilateral carotid bifurcation atherosclerosis without greater than 50% stenosis. Electronically Signed   By: FMargaretha SheffieldMD   On: 08/15/2020 19:49   MR BRAIN WO CONTRAST  Result Date: 08/16/2020 CLINICAL DATA:  Neuro deficit, acute, stroke suspected; facial droop, slurred speech. Additional history provided: Status post hip surgery August 7th, developed acute onset mild right facial  droop and dysarthria 08/15/2020. Evaluate for possible CVA. EXAM: MRI HEAD WITHOUT CONTRAST TECHNIQUE: Multiplanar, multiecho pulse sequences of the brain and surrounding structures were obtained without intravenous contrast. COMPARISON:  Noncontrast head CT and CT angiogram head/neck 08/15/2020. MRI brain 07/02/2019. FINDINGS: Brain: Generalized cerebral and cerebellar atrophy. As before, there is lateral and third ventriculomegaly which appears out proportion to the degree of generalized cerebral volume loss. 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 Advanced patchy and confluent T2/FLAIR hyperintensity within the cerebral white matter, nonspecific but compatible with chronic small vessel ischemic disease. There is no acute infarct. No evidence of an intracranial mass. No chronic intracranial blood products. No extra-axial fluid collection. No midline shift. Vascular: Maintained proximal large arterial flow voids. Skull and upper cervical spine: Focal suspicious marrow lesion. Sinuses/Orbits: Visualized orbits show no acute finding. Leftward gaze no significant paranasal sinus disease. IMPRESSION: No evidence of acute intracranial abnormality. As before, there is lateral and third ventriculomegaly which appears out of proportion to the degree of generalized cerebral volume loss. These findings suggest normal pressure hydrocephalus. Advanced chronic small vessel ischemic changes within the cerebral white matter. Electronically Signed   By: KKellie SimmeringDO   On: 08/16/2020 07:53  DG C-Arm 1-60 Min-No Report  Result Date: 08/13/2020 Fluoroscopy was utilized by the requesting physician.  No radiographic interpretation.   ECHOCARDIOGRAM COMPLETE  Result Date: 08/17/2020    ECHOCARDIOGRAM REPORT   Patient Name:   RENNY SNELGROVE Hane Date of Exam: 08/17/2020 Medical Rec #:  [id number]      Height:       62.0 in Accession #:    [id number]     Weight:       137.8 lb Date of Birth:  August 28, 1925       BSA:          66.632 m Patient Age:    57 years       BP:           144/56 mmHg Patient Gender: F              HR:           88 bpm. Exam Location:  Inpatient Procedure: 2D Echo, Cardiac Doppler and Color Doppler Indications:    CVA  History:        Patient has no prior history of Echocardiogram examinations.                 TIA; Signs/Symptoms:Shortness of Breath.  Sonographer:    Luisa Hart RDCS Referring Phys: [place]  1. Left ventricular ejection fraction, by estimation, is 55 to 60%. The left ventricle has normal function. The left ventricle has no regional wall motion abnormalities. There is mild left ventricular hypertrophy. Left ventricular diastolic parameters are consistent with Grade I diastolic dysfunction (impaired relaxation).  2. Right ventricular systolic function is normal. The right ventricular size is normal. There is normal pulmonary artery systolic pressure. The estimated right ventricular systolic pressure is 0000000 mmHg.  3. The mitral valve is grossly normal. No evidence of mitral valve regurgitation.  4. The aortic valve is tricuspid. Aortic valve regurgitation is trivial. Mild to moderate aortic valve sclerosis/calcification is present, without any evidence of aortic stenosis. Aortic valve mean gradient measures 5.0 mmHg.  5. The inferior vena cava is normal in size with greater than 50% respiratory variability, suggesting right atrial pressure of 3 mmHg. Comparison(s): No prior Echocardiogram. FINDINGS  Left Ventricle: Left ventricular ejection fraction, by estimation, is 55 to 60%. The left ventricle has normal function. The left ventricle has no regional wall motion abnormalities. The left ventricular internal cavity size was normal in size. There is  mild left ventricular hypertrophy. Left ventricular diastolic parameters are consistent with Grade I diastolic dysfunction (impaired relaxation). Indeterminate filling pressures. Right Ventricle: The right ventricular  size is normal. No increase in right ventricular wall thickness. Right ventricular systolic function is normal. There is normal pulmonary artery systolic pressure. The tricuspid regurgitant velocity is 2.55 m/s, and  with an assumed right atrial pressure of 3 mmHg, the estimated right ventricular systolic pressure is 0000000 mmHg. Left Atrium: Left atrial size was normal in size. Right Atrium: Right atrial size was normal in size. Pericardium: There is no evidence of pericardial effusion. Mitral Valve: The mitral valve is grossly normal. No evidence of mitral valve regurgitation. Tricuspid Valve: The tricuspid valve is grossly normal. Tricuspid valve regurgitation is trivial. Aortic Valve: The aortic valve is tricuspid. Aortic valve regurgitation is trivial. Mild to moderate aortic valve sclerosis/calcification is present, without any evidence of aortic stenosis. Aortic valve mean gradient measures 5.0 mmHg. Aortic valve peak  gradient measures 9.0 mmHg. Aortic valve area, by VTI measures 2.00 cm.  Pulmonic Valve: The pulmonic valve was grossly normal. Pulmonic valve regurgitation is trivial. Aorta: The aortic root and ascending aorta are structurally normal, with no evidence of dilitation. Venous: The inferior vena cava is normal in size with greater than 50% respiratory variability, suggesting right atrial pressure of 3 mmHg. IAS/Shunts: The interatrial septum was not well visualized.  LEFT VENTRICLE PLAX 2D LVIDd:         3.70 cm     Diastology LVIDs:         2.00 cm     LV e' medial:    4.13 cm/s LV PW:         1.20 cm     LV E/e' medial:  15.9 LV IVS:        1.20 cm     LV e' lateral:   7.51 cm/s LVOT diam:     2.30 cm     LV E/e' lateral: 8.7 LV SV:         43 LV SV Index:   26 LVOT Area:     4.15 cm  LV Volumes (MOD) LV vol d, MOD A2C: 43.0 ml LV vol d, MOD A4C: 47.1 ml LV vol s, MOD A2C: 31.0 ml LV vol s, MOD A4C: 20.6 ml LV SV MOD A2C:     12.0 ml LV SV MOD A4C:     47.1 ml LV SV MOD BP:      20.6 ml RIGHT  VENTRICLE RV Basal diam:  3.00 cm RV Mid diam:    1.80 cm RV S prime:     12.60 cm/s TAPSE (M-mode): 1.9 cm LEFT ATRIUM             Index       RIGHT ATRIUM           Index LA diam:        3.10 cm 1.90 cm/m  RA Area:     17.30 cm LA Vol (A2C):   10.8 ml 6.62 ml/m  RA Volume:   48.70 ml  29.85 ml/m LA Vol (A4C):   26.8 ml 16.42 ml/m LA Biplane Vol: 17.3 ml 10.60 ml/m  AORTIC VALVE                    PULMONIC VALVE AV Area (Vmax):    1.93 cm     PV Vmax:          0.92 m/s AV Area (Vmean):   1.85 cm     PV Vmean:         60.300 cm/s AV Area (VTI):     2.00 cm     PV VTI:           0.179 m AV Vmax:           150.00 cm/s  PV Peak grad:     3.4 mmHg AV Vmean:          101.000 cm/s PV Mean grad:     2.0 mmHg AV VTI:            0.216 m      PR End Diast Vel: 6.97 msec AV Peak Grad:      9.0 mmHg AV Mean Grad:      5.0 mmHg LVOT Vmax:         69.50 cm/s LVOT Vmean:        44.900 cm/s LVOT VTI:          0.104 m LVOT/AV VTI ratio:  0.25 AR Vena Contracta: 0.20 cm  AORTA Ao Root diam: 3.70 cm Ao Asc diam:  3.20 cm MITRAL VALVE                TRICUSPID VALVE MV Area (PHT): 4.17 cm     TR Peak grad:   26.0 mmHg MV Decel Time: 182 msec     TR Vmax:        255.00 cm/s MV E velocity: 65.60 cm/s MV A velocity: 126.00 cm/s  SHUNTS MV E/A ratio:  0.52         Systemic VTI:  0.10 m                             Systemic Diam: 2.30 cm Lyman Bishop MD Electronically signed by Lyman Bishop MD Signature Date/Time: 08/17/2020/2:40:16 PM    Final    DG HIP OPERATIVE UNILAT W OR W/O PELVIS RIGHT  Result Date: 08/13/2020 CLINICAL DATA:  Surgery, elective Z41.9 (ICD-10-CM) EXAM: OPERATIVE RIGHT HIP (WITH PELVIS IF PERFORMED) 3 VIEWS TECHNIQUE: Fluoroscopic spot image(s) were submitted for interpretation post-operatively. COMPARISON:  08/12/2020. FINDINGS: Fluoro time: 26 seconds Reported radiation dose: 4.9 mGy Three C-arm fluoroscopic images were obtained intraoperatively and submitted for post operative interpretation. These images  demonstrate intramedullary nail and screw fixation of an intertrochanteric right femur fracture with near anatomic alignment. Please see the performing provider's procedural report for further detail. IMPRESSION: Intraoperative fluoroscopy, as detailed above. Electronically Signed   By: Margaretha Sheffield MD   On: 08/13/2020 09:08   DG Hip Unilat  With Pelvis 2-3 Views Right  Result Date: 08/12/2020 CLINICAL DATA:  Acute RIGHT hip pain following fall. EXAM: DG HIP (WITH OR WITHOUT PELVIS) 2-3V RIGHT COMPARISON:  None. FINDINGS: An intertrochanteric fracture of the proximal RIGHT femur is identified with slight varus angulation. No dislocation noted. Degenerative changes in the hips and LOWER lumbar spine are present. IMPRESSION: Intertrochanteric RIGHT femur fracture with slight varus angulation. Electronically Signed   By: Margarette Canada M.D.   On: 08/12/2020 11:44   CT HEAD CODE STROKE WO CONTRAST  Addendum Date: 08/15/2020   ADDENDUM REPORT: 08/15/2020 12:02 ADDENDUM: Following multiple attempts to reach the ordering provider, these results were discussed by telephone on 08/15/2020 at 11:58 pm with provider DAVID Sabino Gasser , who verbally acknowledged these results. Electronically Signed   By: Kellie Simmering DO   On: 08/15/2020 12:02   Result Date: 08/15/2020 CLINICAL DATA:  Code stroke. Neuro deficit, acute, stroke suspected. Additional provided: Fall a few days ago, right hip surgery, right facial droop. EXAM: CT HEAD WITHOUT CONTRAST TECHNIQUE: Contiguous axial images were obtained from the base of the skull through the vertex without intravenous contrast. COMPARISON:  Brain MRI 07/02/2019. MRA head 06/29/2019. Head CT 05/20/2019. FINDINGS: Brain: As before, there is lateral and third ventriculomegaly which is out of proportion to the degree of generalized cerebral atrophy, and suggestive of normal pressure hydrocephalus. Moderate/advanced patchy and confluent hypoattenuation within the cerebral white matter,  nonspecific but compatible with chronic small vessel ischemic disease. There is no acute intracranial hemorrhage. No demarcated cortical infarct. No extra-axial fluid collection. No evidence of an intracranial mass. No midline shift. Vascular: No hyperdense vessel.  Atherosclerotic calcifications. Skull: Normal. Negative for fracture or focal lesion. Sinuses/Orbits: Visualized orbits show no acute finding. No significant paranasal sinus disease. Other: Prominent torus palatinus. ASPECTS [facility] Stroke Program Early CT Score) - Ganglionic level infarction (caudate, lentiform nuclei, internal capsule,  insula, M1-M3 cortex): 7 - Supraganglionic infarction (M4-M6 cortex): 3 Total score (0-10 with 10 being normal): 10 IMPRESSION: No acute intracranial hemorrhage or acute demarcated cortical infarction. ASPECTS is 10. As before, there is lateral and third ventriculomegaly which is out of proportion to the degree of generalized cerebral atrophy. 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 Moderate/advanced chronic small vessel ischemic changes within the cerebral white matter. Electronically Signed: By: Kellie Simmering DO On: 08/15/2020 11:38    Assessment/Plan  1. Closed nondisplaced intertrochanteric fracture of right femur, sequela -  S/P IM nail fixation on 08/13/20 - HYDROcodone-acetaminophen (NORCO/VICODIN) 5-325 MG tablet; Take 1 tablet by mouth every 6 (six) hours as needed for moderate pain or severe pain (pain score 4-6).  Dispense: 15 tablet; Refill: 0 -   WBAT -    Follow-up with orthopedics  2. Postoperative anemia due to acute blood loss - ferrous sulfate 325 (65 FE) MG tablet; Take 1 tablet (325 mg total) by mouth in the morning and at bedtime.  Dispense: 60 tablet; Refill: 0 - vitamin C (ASCORBIC ACID) 500 MG tablet; Take 1 tablet (500 mg total) by mouth 2 (two) times daily.  Dispense: 60 tablet; Refill: 0  3. Primary hypertension - amLODipine (NORVASC) 2.5 MG tablet; Take 1  tablet (2.5 mg total) by mouth every evening.  Dispense: 30 tablet; Refill: 0  4. History of TIA (transient ischemic attack) - clopidogrel (PLAVIX) 75 MG tablet; Take 1 tablet (75 mg total) by mouth daily.  Dispense: 30 tablet; Refill: 0 - aspirin EC 81 MG tablet; Take 1 tablet (81 mg total) by mouth daily. Swallow whole.  Dispense: 30 tablet; Refill: 0  5. Hypothyroidism, unspecified type - levothyroxine (SYNTHROID) 125 MCG tablet; Take 1 tablet (125 mcg total) by mouth daily before breakfast.  Dispense: 30 tablet; Refill: 0  6. Hypomagnesemia - magnesium oxide (MAG-OX) 400 MG tablet; Take 1 tablet (400 mg total) by mouth daily.  Dispense: 30 tablet; Refill: 0     I have filled out patient's discharge paperwork and e-prescribed medications. Patient will have home health PT, OT and Aide.  DME provided: Shower transfer bench, front wheeled walker, transport chair and 3 in 1.  Transport chair -  patient had closed nondisplaced intertrochanteric fracture of right femur S/P IM nail fixation which impairs her ability to perform daily activities like toileting, feeding, dressing, grooming and bathing in the home.  A cane or walker will not resolve the issue with performing activities of daily living 80 transport chair will allow patient to safely perform daily activities.  Patient is not able to propel themselves in the home using a standard weight wheelchair due to generalized weakness.  Patient can safely propel in the transport chair.  Total discharge time: Greater than 30 minutes Greater than 50% was spent in counseling and coordination of care.  Discharge time involved coordination of the discharge process with social worker, nursing staff and therapy department. Medical justification for home health services/DME verified.   Durenda Age, DNP, MSN, FNP-BC [facility] [phone] (Monday-Friday 8:00 a.m. - 5:00 p.m.) [phone] (after hours)

## 2020-09-08 ENCOUNTER — Other Ambulatory Visit: Payer: Self-pay | Admitting: Adult Health

## 2020-09-08 DIAGNOSIS — R2689 Other abnormalities of gait and mobility: Secondary | ICD-10-CM | POA: Diagnosis not present

## 2020-09-08 DIAGNOSIS — M6281 Muscle weakness (generalized): Secondary | ICD-10-CM | POA: Diagnosis not present

## 2020-09-08 MED ORDER — MAGNESIUM OXIDE 400 MG PO TABS: 400.0000 mg | ORAL_TABLET | Freq: Every day | ORAL | 0 refills | Status: AC

## 2020-09-08 MED ORDER — VITAMIN C 500 MG PO TABS: 500.0000 mg | ORAL_TABLET | Freq: Two times a day (BID) | ORAL | 0 refills | Status: DC

## 2020-09-08 MED ORDER — LEVOTHYROXINE SODIUM 125 MCG PO TABS: 125.0000 ug | ORAL_TABLET | Freq: Every day | ORAL | 0 refills | Status: DC

## 2020-09-08 MED ORDER — FERROUS SULFATE 325 (65 FE) MG PO TABS: 325.0000 mg | ORAL_TABLET | Freq: Two times a day (BID) | ORAL | 0 refills | Status: AC

## 2020-09-08 MED ORDER — CLOPIDOGREL BISULFATE 75 MG PO TABS: 75.0000 mg | ORAL_TABLET | Freq: Every day | ORAL | 0 refills | Status: AC

## 2020-09-08 MED ORDER — AMLODIPINE BESYLATE 2.5 MG PO TABS: 2.5000 mg | ORAL_TABLET | Freq: Every evening | ORAL | 0 refills | Status: AC

## 2020-09-08 MED ORDER — POLYETHYLENE GLYCOL 3350 17 G PO PACK: 17.0000 g | PACK | Freq: Every day | ORAL | 1 refills | Status: DC

## 2020-09-08 MED ORDER — HYDROCODONE-ACETAMINOPHEN 5-325 MG PO TABS
1.0000 | ORAL_TABLET | Freq: Four times a day (QID) | ORAL | 0 refills | Status: DC | PRN
Start: 2020-09-08 — End: 2020-10-11

## 2020-09-08 MED ORDER — ASPIRIN EC 81 MG PO TBEC: 81.0000 mg | DELAYED_RELEASE_TABLET | Freq: Every day | ORAL | 0 refills | Status: DC

## 2020-09-10 ENCOUNTER — Other Ambulatory Visit: Payer: Self-pay | Admitting: Adult Health

## 2020-09-10 DIAGNOSIS — I10 Essential (primary) hypertension: Secondary | ICD-10-CM

## 2020-09-11 DIAGNOSIS — I10 Essential (primary) hypertension: Secondary | ICD-10-CM | POA: Diagnosis not present

## 2020-09-11 DIAGNOSIS — M1990 Unspecified osteoarthritis, unspecified site: Secondary | ICD-10-CM | POA: Diagnosis not present

## 2020-09-11 DIAGNOSIS — Z9181 History of falling: Secondary | ICD-10-CM | POA: Diagnosis not present

## 2020-09-11 DIAGNOSIS — E039 Hypothyroidism, unspecified: Secondary | ICD-10-CM | POA: Diagnosis not present

## 2020-09-11 DIAGNOSIS — S72141D Displaced intertrochanteric fracture of right femur, subsequent encounter for closed fracture with routine healing: Secondary | ICD-10-CM | POA: Diagnosis not present

## 2020-09-11 DIAGNOSIS — H9190 Unspecified hearing loss, unspecified ear: Secondary | ICD-10-CM | POA: Diagnosis not present

## 2020-09-11 DIAGNOSIS — M8580 Other specified disorders of bone density and structure, unspecified site: Secondary | ICD-10-CM | POA: Diagnosis not present

## 2020-09-12 DIAGNOSIS — R29810 Facial weakness: Secondary | ICD-10-CM | POA: Diagnosis not present

## 2020-09-12 DIAGNOSIS — Z8781 Personal history of (healed) traumatic fracture: Secondary | ICD-10-CM | POA: Diagnosis not present

## 2020-09-12 DIAGNOSIS — Z7982 Long term (current) use of aspirin: Secondary | ICD-10-CM | POA: Diagnosis not present

## 2020-09-12 DIAGNOSIS — E039 Hypothyroidism, unspecified: Secondary | ICD-10-CM | POA: Diagnosis not present

## 2020-09-12 DIAGNOSIS — Z7901 Long term (current) use of anticoagulants: Secondary | ICD-10-CM | POA: Diagnosis not present

## 2020-09-12 DIAGNOSIS — N3000 Acute cystitis without hematuria: Secondary | ICD-10-CM | POA: Diagnosis not present

## 2020-09-12 DIAGNOSIS — G459 Transient cerebral ischemic attack, unspecified: Secondary | ICD-10-CM | POA: Diagnosis not present

## 2020-09-13 DIAGNOSIS — I10 Essential (primary) hypertension: Secondary | ICD-10-CM | POA: Diagnosis not present

## 2020-09-13 DIAGNOSIS — N390 Urinary tract infection, site not specified: Secondary | ICD-10-CM | POA: Diagnosis not present

## 2020-09-22 DIAGNOSIS — S72141D Displaced intertrochanteric fracture of right femur, subsequent encounter for closed fracture with routine healing: Secondary | ICD-10-CM | POA: Diagnosis not present

## 2020-09-22 DIAGNOSIS — Z961 Presence of intraocular lens: Secondary | ICD-10-CM | POA: Diagnosis not present

## 2020-09-22 DIAGNOSIS — H1132 Conjunctival hemorrhage, left eye: Secondary | ICD-10-CM | POA: Diagnosis not present

## 2020-09-22 DIAGNOSIS — M1990 Unspecified osteoarthritis, unspecified site: Secondary | ICD-10-CM | POA: Diagnosis not present

## 2020-09-22 DIAGNOSIS — H04123 Dry eye syndrome of bilateral lacrimal glands: Secondary | ICD-10-CM | POA: Diagnosis not present

## 2020-09-22 DIAGNOSIS — E039 Hypothyroidism, unspecified: Secondary | ICD-10-CM | POA: Diagnosis not present

## 2020-09-22 DIAGNOSIS — I10 Essential (primary) hypertension: Secondary | ICD-10-CM | POA: Diagnosis not present

## 2020-09-22 DIAGNOSIS — Z9181 History of falling: Secondary | ICD-10-CM | POA: Diagnosis not present

## 2020-09-22 DIAGNOSIS — H02133 Senile ectropion of right eye, unspecified eyelid: Secondary | ICD-10-CM | POA: Diagnosis not present

## 2020-09-22 DIAGNOSIS — H9190 Unspecified hearing loss, unspecified ear: Secondary | ICD-10-CM | POA: Diagnosis not present

## 2020-09-22 DIAGNOSIS — M8580 Other specified disorders of bone density and structure, unspecified site: Secondary | ICD-10-CM | POA: Diagnosis not present

## 2020-09-26 DIAGNOSIS — Z4789 Encounter for other orthopedic aftercare: Secondary | ICD-10-CM | POA: Diagnosis not present

## 2020-09-28 ENCOUNTER — Telehealth: Payer: Self-pay | Admitting: Hematology

## 2020-09-28 DIAGNOSIS — M8580 Other specified disorders of bone density and structure, unspecified site: Secondary | ICD-10-CM | POA: Diagnosis not present

## 2020-09-28 DIAGNOSIS — H9190 Unspecified hearing loss, unspecified ear: Secondary | ICD-10-CM | POA: Diagnosis not present

## 2020-09-28 DIAGNOSIS — I10 Essential (primary) hypertension: Secondary | ICD-10-CM | POA: Diagnosis not present

## 2020-09-28 DIAGNOSIS — E039 Hypothyroidism, unspecified: Secondary | ICD-10-CM | POA: Diagnosis not present

## 2020-09-28 DIAGNOSIS — M1990 Unspecified osteoarthritis, unspecified site: Secondary | ICD-10-CM | POA: Diagnosis not present

## 2020-09-28 DIAGNOSIS — Z9181 History of falling: Secondary | ICD-10-CM | POA: Diagnosis not present

## 2020-09-28 DIAGNOSIS — S72141D Displaced intertrochanteric fracture of right femur, subsequent encounter for closed fracture with routine healing: Secondary | ICD-10-CM | POA: Diagnosis not present

## 2020-09-28 NOTE — Telephone Encounter (Signed)
Scheduled appt per 9/22 referral. Pt's daughter in law is aware of appt date and time.

## 2020-10-02 ENCOUNTER — Telehealth: Payer: Self-pay | Admitting: Hematology

## 2020-10-02 ENCOUNTER — Other Ambulatory Visit: Payer: Self-pay

## 2020-10-02 ENCOUNTER — Inpatient Hospital Stay: Payer: PPO | Attending: Hematology | Admitting: Hematology

## 2020-10-02 VITALS — BP 148/69 | HR 99 | Temp 97.8°F | Resp 18 | Ht 62.0 in | Wt 118.0 lb

## 2020-10-02 DIAGNOSIS — Z7982 Long term (current) use of aspirin: Secondary | ICD-10-CM | POA: Diagnosis not present

## 2020-10-02 DIAGNOSIS — D72819 Decreased white blood cell count, unspecified: Secondary | ICD-10-CM

## 2020-10-02 DIAGNOSIS — E039 Hypothyroidism, unspecified: Secondary | ICD-10-CM | POA: Insufficient documentation

## 2020-10-02 DIAGNOSIS — M8580 Other specified disorders of bone density and structure, unspecified site: Secondary | ICD-10-CM

## 2020-10-02 DIAGNOSIS — Z79899 Other long term (current) drug therapy: Secondary | ICD-10-CM

## 2020-10-02 NOTE — Telephone Encounter (Signed)
Per 9/26 los RTC with Dr Irene Limbo as needed

## 2020-10-02 NOTE — Progress Notes (Addendum)
Tanya [name]   HEMATOLOGY/ONCOLOGY CONSULTATION NOTE  Date of Service: 10/02/2020  Patient Care Team: Tanya Pretty, MD as PCP - General (Internal Medicine)  CHIEF COMPLAINTS/PURPOSE OF CONSULTATION:  Leucopenia  HISTORY OF PRESENTING ILLNESS:   Tanya [name] is a wonderful 87 y.o. female who has been referred to Tanya [name] by Dr .Tanya Pretty, MD for evaluation and management of leucopenia.  Patient has a history of hypothyroidism, osteopenia, primary osteoarthritis, hearing loss who notes that she has had low white blood counts on and off for the better part of 10 years or more.  Her last labs with her primary care physician on 09/22/2020 showed CBC with a WBC count of 2.8k with an ANC of 1100 with a hemoglobin of 8.8 with MCV of 91.3 platelets of 499k.  Prior to this on 08/12/2020 patient was admitted to the hospital with intertrochanteric fracture of the right femur following a mechanical fall needing IM nailing. On admission her CBC was within normal limits on 08/12/2020 with a hemoglobin of 12.1, WBC count of 4.9k and platelets of 356k. On discharge on 08/17/2020 the patient had blood loss anemia related to surgery with a hemoglobin of 8, WBC count of 4.4k and platelets of 301k. She previously as long back as 08/27/2005 has had leukopenia with a WBC count of 2.7k and ANC of 700.   Patient notes that she was not eating too well in the skilled nursing facility but is doing better now with her p.o. intake. No fevers no chills no night sweats. No focal symptoms suggestive of active infection. No sudden weight loss.  Patient notes her right hip pain has nearly resolved and she is not needing any pain medications at this time. Continues to regularly take her thyroid replacement. Is on aspirin plus Plavix for possible old CVA. Continues to be on oral iron replacement for blood loss anemia post surgery with improving anemia.  Has been noted in the past to be B12 deficient with a level of 185 on 06/21/2019.   Has been on low-dose replacement with 100 mcg p.o. daily.  Recent thyroid function test showed a TSH of 24.6 on recent 08/13/2020 with a free T4 level of 0.6    MEDICAL HISTORY:  Past Medical History:  Diagnosis Date   Hearing loss    Hypothyroidism    Neutropenia ([place])    Osteopenia    Primary osteoarthritis    Tremor     SURGICAL HISTORY: Past Surgical History:  Procedure Laterality Date   INTRAMEDULLARY (IM) NAIL INTERTROCHANTERIC Right 08/13/2020   Procedure: INTRAMEDULLARY (IM) NAIL INTERTROCHANTRIC;  Surgeon: Gaynelle Arabian, MD;  Location: [facility];  Service: Orthopedics;  Laterality: Right;    SOCIAL HISTORY: Social History   Socioeconomic History   Marital status: Widowed    Spouse name: Not on file   Number of children: Not on file   Years of education: Not on file   Highest education level: Not on file  Occupational History   Not on file  Tobacco Use   Smoking status: Never   Smokeless tobacco: Never  Substance and Sexual Activity   Alcohol use: Yes    Alcohol/week: 1.0 standard drink    Types: 1 Glasses of wine per week    Comment: bourbon per day one cup prn   Drug use: Not Currently   Sexual activity: Not Currently  Other Topics Concern   Not on file  Social History Narrative   Not on file   Social Determinants of Health  Financial Resource Strain: Not on file  Food Insecurity: Not on file  Transportation Needs: Not on file  Physical Activity: Not on file  Stress: Not on file  Social Connections: Not on file  Intimate Partner Violence: Not on file    FAMILY HISTORY: Family History  Problem Relation Age of Onset   Diabetes Sister     ALLERGIES:  is allergic to codeine, shellfish allergy, shellfish-derived products, and amoxicillin.  MEDICATIONS:  Current Outpatient Medications  Medication Sig Dispense Refill   amLODipine (NORVASC) 2.5 MG tablet Take 1 tablet (2.5 mg total) by mouth every evening. 30 tablet 0   Apoaequorin (PREVAGEN) 10  MG CAPS Take 1 capsule by mouth every morning. 1 capsule 1   aspirin EC 81 MG tablet Take 1 tablet (81 mg total) by mouth daily. Swallow whole. 30 tablet 0   bisacodyl (DULCOLAX) 10 MG suppository Place 10 mg rectally as needed for moderate constipation.     Cholecalciferol (VITAMIN D3) 50 MCG (2000 UT) TABS Take 2,000 Units by mouth daily.     clopidogrel (PLAVIX) 75 MG tablet Take 1 tablet (75 mg total) by mouth daily. 30 tablet 0   Emollient (CETAPHIL) cream Apply 1 application topically 2 (two) times daily.     ferrous sulfate 325 (65 FE) MG tablet Take 1 tablet (325 mg total) by mouth in the morning and at bedtime. 60 tablet 0   HYDROcodone-acetaminophen (NORCO/VICODIN) 5-325 MG tablet Take 1 tablet by mouth every 6 (six) hours as needed for moderate pain or severe pain (pain score 4-6). 15 tablet 0   levothyroxine (SYNTHROID) 125 MCG tablet Take 1 tablet (125 mcg total) by mouth daily before breakfast. 30 tablet 0   Magnesium Hydroxide (MILK OF MAGNESIA PO) Take 30 mLs by mouth as needed.     magnesium oxide (MAG-OX) 400 MG tablet Take 1 tablet (400 mg total) by mouth daily. 30 tablet 0   NON FORMULARY Diet:NAS     OXYGEN Inhale into the lungs.     polyethylene glycol (MIRALAX / GLYCOLAX) 17 g packet Take 17 g by mouth daily. in 6 oz of liquid 14 each 1   senna-docusate (SENOKOT-S) 8.6-50 MG tablet Take 1 tablet by mouth daily.     Sodium Phosphates (RA SALINE ENEMA RE) Place 1 Dose rectally as needed.     vitamin B-12 (CYANOCOBALAMIN) 100 MCG tablet Take 100 mcg by mouth daily.     vitamin C (ASCORBIC ACID) 500 MG tablet Take 1 tablet (500 mg total) by mouth 2 (two) times daily. 60 tablet 0   No current facility-administered medications for this visit.    REVIEW OF SYSTEMS:    10 Point review of Systems was done is negative except as noted above.  PHYSICAL EXAMINATION: ECOG PERFORMANCE STATUS: 3 - Symptomatic, >50% confined to bed  . Vitals:   10/02/20 1116  BP: (!) 148/69   Pulse: 99  Resp: 18  Temp: 97.8 F (36.6 C)  SpO2: 99%   Filed Weights   10/02/20 1116  Weight: 118 lb (53.5 kg)   .Body mass index is 21.58 kg/m.  GENERAL:alert, in no acute distress and comfortable SKIN: no acute rashes, no significant lesions EYES: conjunctiva are pink and non-injected, sclera anicteric OROPHARYNX: MMM, no exudates, no oropharyngeal erythema or ulceration NECK: supple, no JVD LYMPH:  no palpable lymphadenopathy in the cervical, axillary or inguinal regions LUNGS: clear to auscultation b/l with normal respiratory effort HEART: regular rate & rhythm ABDOMEN:  normoactive bowel sounds ,  non tender, not distended. Extremity: no pedal edema PSYCH: alert & oriented x 3 with fluent speech NEURO: no focal motor/sensory deficits  LABORATORY DATA:  I have reviewed the data as listed  . CBC Latest Ref Rng & Units 08/17/2020 08/16/2020 08/15/2020  WBC 4.0 - 10.5 K/uL 4.4 3.8(L) 4.1  Hemoglobin 12.0 - 15.0 g/dL 8.0(L) 8.1(L) 8.1(L)  Hematocrit 36.0 - 46.0 % 25.4(L) 25.1(L) 25.0(L)  Platelets 150 - 400 K/uL 301 244 222    . CMP Latest Ref Rng & Units 08/17/2020 08/16/2020 08/15/2020  Glucose 70 - 99 mg/dL 99 90 94  BUN 8 - 23 mg/dL _0 Creatinine 0.44 - 1.00 mg/dL 0.53 0.49 0.58  Sodium 135 - 145 mmol/L 134(L) 135 131(L)  Potassium 3.5 - 5.1 mmol/L 3.7 3.6 3.9  Chloride 98 - 111 mmol/L 100 101 97(L)  CO2 22 - 32 mmol/L 28 32 28  Calcium 8.9 - 10.3 mg/dL 8.3(L) 8.3(L) 8.1(L)  Total Protein 6.0 - 8.3 g/dL - - -  Total Bilirubin 0.3 - 1.2 mg/dL - - -  Alkaline Phos 39 - 117 U/L - - -  AST 0 - 37 U/L - - -  ALT 0 - 40 U/L - - -     RADIOGRAPHIC STUDIES: I have personally reviewed the radiological images as listed and agreed with the findings in the report. No results found.  ASSESSMENT & PLAN:   87 yo with   1) Leucopenia/Mild neutropenia. Patient reports she has had mild leukopenia/neutropenia on and off for more than 10 years. No issues with unusual  or severe or frequent infections. Has had a history of vitamin B12 deficiency on replacement. Recent thyroid function suggest elevated TSH and low free T4 suggesting thyroid replacement might need optimization. 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 Patient's leukopenia/neutropenia was appropriately corrected with the stress of surgery and fracture.  2) new acute blood loss anemia related to surgery.  Patient's hemoglobin was normal prior to surgery.  3) new thrombocytosis after surgery likely reactive much less likely clonal. PLAN -I discussed available lab results with the patient and her daughter. -We discussed presence of mild leukopenia and neutropenia on recent labs. -Patient notes she feels good and is not inclined to pursue significant hematologic work-up at this time and prefers to not do any labs today. 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 -Would recommend optimization of B12 replacement sublingually with her primary care physician to maintain levels of more than 400. -Also recommended she start taking vitamin B complex 1 capsule p.o. daily to address any other associated B vitamin deficiencies. -Reasonable to continue oral iron replacement for her blood loss anemia.  Replacing Iron may also help her reactive thrombocytosis resolve. -Continue to optimize thyroid replacement. -Patient is not inclined to get bone marrow biopsy or any other additional hematologic work-up based on goals of care discussion. -Would recommend she continue following up with Dr. Shelia Media to monitor blood counts every 2 to 3 months and we shall be happy to see her if there is worsening of blood counts. -Patient is agreeable with this plan and is grateful for the explanations.   All of the patients questions were answered with apparent satisfaction. The patient knows  to call the clinic with any problems, questions or concerns.  . The total time spent in the appointment was 45 minutes and more than 50% was on counseling and direct patient  cares.    Sullivan Lone MD [place] AAHIVMS [facility] Hematology/Oncology Physician [facility]  (Office):       [phone] (Work cell):  [phone] (Fax):           [phone]  10/02/2020 11:29 AM

## 2020-10-02 NOTE — Patient Instructions (Signed)
Thank you for choosing [facility] to provide your care.   Should you have questions after your visit to the [facility] ([facility]), please contact this office at [phone] between 8:30 AM and 4:30 PM.  Voice mails left after 4:00 PM may not be returned until the following business day.  Calls received after 4:30 PM will be answered by an off-site Nurse Triage Line.    Prescription Refills:  Please have your pharmacy contact us directly for most prescription requests.  Contact the office directly for refills of narcotics (pain medications). Allow 48-72 hours for refills.  Appointments: Please contact the [facility] scheduling department [phone] for questions regarding [facility] appointment scheduling.  Contact the schedulers with any scheduling changes so that your appointment can be rescheduled in a timely manner.   Central Scheduling for [place] [phone] - Call to schedule procedures such as PET scans, CT scans, MRI, Ultrasound, etc.  To afford each patient quality time with our providers, please arrive 30 minutes before your scheduled appointment time.  If you arrive late for your appointment, you may be asked to reschedule.  We strive to give you quality time with our providers, and arriving late affects you and other patients whose appointments are after yours. If you are a no show for multiple scheduled visits, you may be dismissed from the clinic at the providers discretion.     Resources: [facility] Social Workers [phone] for additional information on assistance programs or assistance connecting with community support programs   [place] DSS  [phone]: Information regarding food stamps, Medicaid, and utility assistance [place] [employer] [place] [phone]   [place] [employer]'s shared-ride transportation service for eligible riders who have a disability that prevents them from riding the fixed route bus.   Medicare Rights Center 800-333-4114  Helps people with Medicare understand their rights and benefits, navigate the Medicare system, and secure the quality healthcare they deserve [employer] 800-227-2345 Assists patients locate various types of support and financial assistance Cancer Care: 1-800-813-HOPE (4673) Provides financial assistance, online support groups, medication/co-pay assistance.   Transportation Assistance for appointments at [facility]: Transportation Coordinator [phone]  Again, thank you for choosing [facility] for your care.       

## 2020-10-04 DIAGNOSIS — H9190 Unspecified hearing loss, unspecified ear: Secondary | ICD-10-CM | POA: Diagnosis not present

## 2020-10-04 DIAGNOSIS — M1990 Unspecified osteoarthritis, unspecified site: Secondary | ICD-10-CM | POA: Diagnosis not present

## 2020-10-04 DIAGNOSIS — Z9181 History of falling: Secondary | ICD-10-CM | POA: Diagnosis not present

## 2020-10-04 DIAGNOSIS — E039 Hypothyroidism, unspecified: Secondary | ICD-10-CM | POA: Diagnosis not present

## 2020-10-04 DIAGNOSIS — M8580 Other specified disorders of bone density and structure, unspecified site: Secondary | ICD-10-CM | POA: Diagnosis not present

## 2020-10-04 DIAGNOSIS — S72141D Displaced intertrochanteric fracture of right femur, subsequent encounter for closed fracture with routine healing: Secondary | ICD-10-CM | POA: Diagnosis not present

## 2020-10-04 DIAGNOSIS — I10 Essential (primary) hypertension: Secondary | ICD-10-CM | POA: Diagnosis not present

## 2020-10-08 DIAGNOSIS — R2689 Other abnormalities of gait and mobility: Secondary | ICD-10-CM | POA: Diagnosis not present

## 2020-10-08 DIAGNOSIS — M6281 Muscle weakness (generalized): Secondary | ICD-10-CM | POA: Diagnosis not present

## 2020-10-09 ENCOUNTER — Other Ambulatory Visit: Payer: Self-pay | Admitting: Adult Health

## 2020-10-09 DIAGNOSIS — S72141D Displaced intertrochanteric fracture of right femur, subsequent encounter for closed fracture with routine healing: Secondary | ICD-10-CM | POA: Diagnosis not present

## 2020-10-09 DIAGNOSIS — H9190 Unspecified hearing loss, unspecified ear: Secondary | ICD-10-CM | POA: Diagnosis not present

## 2020-10-09 DIAGNOSIS — E039 Hypothyroidism, unspecified: Secondary | ICD-10-CM | POA: Diagnosis not present

## 2020-10-09 DIAGNOSIS — Z9181 History of falling: Secondary | ICD-10-CM | POA: Diagnosis not present

## 2020-10-09 DIAGNOSIS — I10 Essential (primary) hypertension: Secondary | ICD-10-CM | POA: Diagnosis not present

## 2020-10-09 DIAGNOSIS — M8580 Other specified disorders of bone density and structure, unspecified site: Secondary | ICD-10-CM | POA: Diagnosis not present

## 2020-10-09 DIAGNOSIS — M1990 Unspecified osteoarthritis, unspecified site: Secondary | ICD-10-CM | POA: Diagnosis not present

## 2020-10-09 DIAGNOSIS — Z8673 Personal history of transient ischemic attack (TIA), and cerebral infarction without residual deficits: Secondary | ICD-10-CM

## 2020-10-11 ENCOUNTER — Ambulatory Visit: Payer: PPO | Admitting: Neurology

## 2020-10-11 ENCOUNTER — Encounter: Payer: Self-pay | Admitting: Neurology

## 2020-10-11 ENCOUNTER — Other Ambulatory Visit: Payer: Self-pay

## 2020-10-11 VITALS — BP 131/71 | HR 90 | Ht 62.0 in | Wt 118.0 lb

## 2020-10-11 DIAGNOSIS — G3184 Mild cognitive impairment, so stated: Secondary | ICD-10-CM | POA: Diagnosis not present

## 2020-10-11 DIAGNOSIS — Z8781 Personal history of (healed) traumatic fracture: Secondary | ICD-10-CM | POA: Diagnosis not present

## 2020-10-11 DIAGNOSIS — D649 Anemia, unspecified: Secondary | ICD-10-CM | POA: Diagnosis not present

## 2020-10-11 DIAGNOSIS — R29810 Facial weakness: Secondary | ICD-10-CM

## 2020-10-11 DIAGNOSIS — G459 Transient cerebral ischemic attack, unspecified: Secondary | ICD-10-CM | POA: Diagnosis not present

## 2020-10-11 DIAGNOSIS — D72819 Decreased white blood cell count, unspecified: Secondary | ICD-10-CM | POA: Diagnosis not present

## 2020-10-11 DIAGNOSIS — E039 Hypothyroidism, unspecified: Secondary | ICD-10-CM | POA: Diagnosis not present

## 2020-10-11 DIAGNOSIS — Z7901 Long term (current) use of anticoagulants: Secondary | ICD-10-CM | POA: Diagnosis not present

## 2020-10-11 NOTE — Progress Notes (Signed)
[facility] [address] [place]. [place] [address] [phone]       OFFICE CONSULT NOTE  [name] Date of Birth:  [birth year] Medical Record Number:  [id number]   Referring MD: Tanya [name]  Reason for Referral: TIA  HPI: Tanya [name] is a pleasant 87 year old Caucasian lady seen today for consultation visit for TIA.  She is accompanied by her daughter.  History is obtained from them and review of electronic medical records and I personally reviewed pertinent imaging films in PACS.87 year old female with a PMHx of TIA, vitamine B12 deficiency, mild cognitive impairment, hearing loss, hypothyroidism, osteopenia and tremor who presented to the hospital on 08/12/20 after tripping and falling at home, fracturing her right hip (displaced right intertrochanteric femur fracture). She underwent intramedullary nailing on 8/7 /22 and since then has been able to ambulate. On 08/15/20 morning, she was noted by daughter to suddenly have slurred speech and right facial droop. LKN 11 AM. Code Stroke was called. She was on ASA CT scan of the head showed no acute abnormality with aspect score of 10.  CT angiogram of the brain and neck showed mild less than 50% bilateral carotid bifurcation stenosis with moderate left vertebral artery origin stenosis and moderate right paraclinoid and mild left paraclinoid stenosis.  Echocardiogram showed ejection fraction of 50 to 55% without definite cardiac source of embolism.  LDL cholesterol 69 mg percent hemoglobin A1c was 5.8.  Patient symptoms improved by the time she came back from CT scan.  She was started on dual antiplatelet therapy of aspirin and Plavix.  Patient has since done well she has been home and getting home physical Occupational Therapy.  She is able to ambulate with a Rollator.  She can walk short distances by herself.  She has had no recurrent TIA or stroke symptoms. Patient has previously been seen in the office and she had an episode  on 05/05/2019 was felt to be complex partial seizure versus a TIA.  She also had mild age-related memory loss and cognitive impairment for which she takes Prevagen.  She is fairly independent despite her age lives alone and daughter lives nearby and provides some help which is mostly supervision.  She has had 2 or 3 falls in the last few years most of these are mechanical.  She denies any progressive memory loss or cognitive impairment or incontinence.  Her MRI scan on 08/16/2020 had shown no acute abnormality but generalized atrophy and white matter changes as well as slight ventriculomegaly out of proportion to the atrophy but this appeared unchanged from previous MRI from 07/02/2019.. ROS:   14 system review of systems is positive for slurred speech, facial weakness, fall, walking difficulty, memory loss all other systems negative  PMH:  Past Medical History:  Diagnosis Date   Hearing loss    Hypothyroidism    Neutropenia (HCC)    Osteopenia    Primary osteoarthritis    Tremor     Social History:  Social History   Socioeconomic History   Marital status: Widowed    Spouse name: Not on file   Number of children: Not on file   Years of education: Not on file   Highest education level: Not on file  Occupational History   Not on file  Tobacco Use   Smoking status: Never   Smokeless tobacco: Never  Substance and Sexual Activity   Alcohol use: Yes    Alcohol/week: 1.0 standard drink    Types: 1  Glasses of wine per week    Comment: bourbon per day one cup prn   Drug use: Not Currently   Sexual activity: Not Currently  Other Topics Concern   Not on file  Social History Narrative   Not on file   Social Determinants of Health   Financial Resource Strain: Not on file  Food Insecurity: Not on file  Transportation Needs: Not on file  Physical Activity: Not on file  Stress: Not on file  Social Connections: Not on file  Intimate Partner Violence: Not on file    Medications:    Current Outpatient Medications on File Prior to Visit  Medication Sig Dispense Refill   amLODipine (NORVASC) 2.5 MG tablet Take 1 tablet (2.5 mg total) by mouth every evening. 30 tablet 0   Apoaequorin (PREVAGEN) 10 MG CAPS Take 1 capsule by mouth every morning. 1 capsule 1   aspirin EC 81 MG tablet Take 1 tablet (81 mg total) by mouth daily. Swallow whole. 30 tablet 0   Cholecalciferol (VITAMIN D3) 50 MCG (2000 UT) TABS Take 2,000 Units by mouth daily.     clopidogrel (PLAVIX) 75 MG tablet Take 1 tablet (75 mg total) by mouth daily. 30 tablet 0   ferrous sulfate 325 (65 FE) MG tablet Take 1 tablet (325 mg total) by mouth in the morning and at bedtime. 60 tablet 0   levothyroxine (SYNTHROID) 125 MCG tablet Take 1 tablet (125 mcg total) by mouth daily before breakfast. 30 tablet 0   magnesium oxide (MAG-OX) 400 MG tablet Take 1 tablet (400 mg total) by mouth daily. 30 tablet 0   NON FORMULARY Diet:NAS     vitamin B-12 (CYANOCOBALAMIN) 100 MCG tablet Take 100 mcg by mouth daily.     vitamin C (ASCORBIC ACID) 500 MG tablet Take 1 tablet (500 mg total) by mouth 2 (two) times daily. 60 tablet 0   No current facility-administered medications on file prior to visit.    Allergies:   Allergies  Allergen Reactions   Codeine Other (See Comments)    Extreme headaches   Shellfish Allergy Hives   Shellfish-Derived Products Hives   Amoxicillin Itching and Rash    Physical Exam General: Frail elderly Caucasian lady, seated, in no evident distress Head: head normocephalic and atraumatic.   Neck: supple with no carotid or supraclavicular bruits Cardiovascular: regular rate and rhythm, no murmurs Musculoskeletal: no deformity Skin:  no rash/petichiae Vascular:  Normal pulses all extremities  Neurologic Exam Mental Status: Awake and fully alert. Oriented to place and time. Recent and remote memory intact. Attention span, concentration and fund of knowledge appropriate. Mood and affect appropriate.   Diminished recall 0/3.  Able to name 10 animals which can walk on 4 legs.  Clock drawing 3/4.  Able to copy intersecting pentagons. Cranial Nerves: Fundoscopic exam reveals sharp disc margins. Pupils equal, briskly reactive to light. Extraocular movements full without nystagmus. Visual fields full to confrontation. Hearing intact. Facial sensation intact. Face, tongue, palate moves normally and symmetrically.  Motor: Normal bulk and tone. Normal strength in all tested extremity muscles. Sensory.: intact to touch , pinprick , position and vibratory sensation.  Coordination: Rapid alternating movements normal in all extremities. Finger-to-nose and heel-to-shin performed accurately bilaterally. Gait and Station: Arises from chair without difficulty. Stance is stooped.  Uses a wheeled walker.  FABER Centrax right hip.  Balance is poor particularly while turning..  Reflexes: 1+ and symmetric. Toes downgoing.   NIHSS  0 Modified Rankin  1  ASSESSMENT: 87 year old Caucasian lady with transient episode of slurred speech and right facial droop in August 2022 likely left subcortical TIA from small vessel disease.  Vascular risk factors of hypertension, intra and extracranial stenosis and age.  She also has mild cognitive impairment which is age-appropriate and stable.  Her brain scans have shown ventriculomegaly out of proportion to the degree of atrophy but her clinical presentation is not compatible with normal pressure hydrocephalus and prominent ventricles have been documented in her prior imaging studies going as far back as MRI in 2002.     PLAN: I had a long d/w patient and her daughter about her recent TIA, mild cognitive impairment, risk for recurrent stroke/TIAs, personally independently reviewed imaging studies and stroke evaluation results and answered questions.Continue Plavix 75 mg daily alone and discontinue aspirin now for secondary stroke prevention and maintain strict control of  hypertension with blood pressure goal below 130/90, diabetes with hemoglobin A1c goal below 6.5% and lipids with LDL cholesterol goal below 70 mg/dL. I also advised the patient to eat a healthy diet with plenty of whole grains, cereals, fruits and vegetables, exercise regularly and maintain ideal body weight.  She was advised to use her walker at all times and we discussed fall prevention precautions.  She was also encouraged to increase participation in cognitively challenging activities like solving crossword puzzles, playing bridge and sodoku.  Followup in the future with my nurse practitioner Janett Billow in 3 months or call earlier if necessary.  Greater than 50% time during this 45-minute consultation visit was spent on counseling and coordination of care about a TIA as well as mild cognitive impairment and answering questions. Antony Contras, MD  Note: This document was prepared with digital dictation and possible smart phrase technology. Any transcriptional errors that result from this process are unintentional.

## 2020-10-11 NOTE — Patient Instructions (Signed)
I had a long d/w patient and her daughter about her recent TIA, mild cognitive impairment, risk for recurrent stroke/TIAs, personally independently reviewed imaging studies and stroke evaluation results and answered questions.Continue Plavix 75 mg daily alone and discontinue aspirin now for secondary stroke prevention and maintain strict control of hypertension with blood pressure goal below 130/90, diabetes with hemoglobin A1c goal below 6.5% and lipids with LDL cholesterol goal below 70 mg/dL. I also advised the patient to eat a healthy diet with plenty of whole grains, cereals, fruits and vegetables, exercise regularly and maintain ideal body weight.  She was advised to use her walker at all times and we discussed fall prevention precautions.  She was also encouraged to increase participation in cognitively challenging activities like solving crossword puzzles, playing bridge and sodoku.  Followup in the future with my nurse practitioner Janett Billow in 3 months or call earlier if necessary. Fall Prevention in the Home, Adult Falls can cause injuries and can happen to people of all ages. There are many things you can do to make your home safe and to help prevent falls. Ask for help when making these changes. What actions can I take to prevent falls? General Instructions Use good lighting in all rooms. Replace any light bulbs that burn out. Turn on the lights in dark areas. Use night-lights. Keep items that you use often in easy-to-reach places. Lower the shelves around your home if needed. Set up your furniture so you have a clear path. Avoid moving your furniture around. Do not have throw rugs or other things on the floor that can make you trip. Avoid walking on wet floors. If any of your floors are uneven, fix them. Add color or contrast paint or tape to clearly mark and help you see: Grab bars or handrails. First and last steps of staircases. Where the edge of each step is. If you use a  stepladder: Make sure that it is fully opened. Do not climb a closed stepladder. Make sure the sides of the stepladder are locked in place. Ask someone to hold the stepladder while you use it. Know where your pets are when moving through your home. What can I do in the bathroom?   Keep the floor dry. Clean up any water on the floor right away. Remove soap buildup in the tub or shower. Use nonskid mats or decals on the floor of the tub or shower. Attach bath mats securely with double-sided, nonslip rug tape. If you need to sit down in the shower, use a plastic, nonslip stool. Install grab bars by the toilet and in the tub and shower. Do not use towel bars as grab bars. What can I do in the bedroom? Make sure that you have a light by your bed that is easy to reach. Do not use any sheets or blankets for your bed that hang to the floor. Have a firm chair with side arms that you can use for support when you get dressed. What can I do in the kitchen? Clean up any spills right away. If you need to reach something above you, use a step stool with a grab bar. Keep electrical cords out of the way. Do not use floor polish or wax that makes floors slippery. What can I do with my stairs? Do not leave any items on the stairs. Make sure that you have a light switch at the top and the bottom of the stairs. Make sure that there are handrails on both sides of  the stairs. Fix handrails that are broken or loose. Install nonslip stair treads on all your stairs. Avoid having throw rugs at the top or bottom of the stairs. Choose a carpet that does not hide the edge of the steps on the stairs. Check carpeting to make sure that it is firmly attached to the stairs. Fix carpet that is loose or worn. What can I do on the outside of my home? Use bright outdoor lighting. Fix the edges of walkways and driveways and fix any cracks. Remove anything that might make you trip as you walk through a door, such as a  raised step or threshold. Trim any bushes or trees on paths to your home. Check to see if handrails are loose or broken and that both sides of all steps have handrails. Install guardrails along the edges of any raised decks and porches. Clear paths of anything that can make you trip, such as tools or rocks. Have leaves, snow, or ice cleared regularly. Use sand or salt on paths during winter. Clean up any spills in your garage right away. This includes grease or oil spills. What other actions can I take? Wear shoes that: Have a low heel. Do not wear high heels. Have rubber bottoms. Feel good on your feet and fit well. Are closed at the toe. Do not wear open-toe sandals. Use tools that help you move around if needed. These include: Canes. Walkers. Scooters. Crutches. Review your medicines with your doctor. Some medicines can make you feel dizzy. This can increase your chance of falling. Ask your doctor what else you can do to help prevent falls. Where to find more information Centers for Disease Control and Prevention, STEADI: http://www.wolf.info/ National Institute on Aging: http://kim-miller.com/ Contact a doctor if: You are afraid of falling at home. You feel weak, drowsy, or dizzy at home. You fall at home. Summary There are many simple things that you can do to make your home safe and to help prevent falls. Ways to make your home safe include removing things that can make you trip and installing grab bars in the bathroom. Ask for help when making these changes in your home. This information is not intended to replace advice given to you by your health care provider. Make sure you discuss any questions you have with your health care provider. Document Revised: 07/28/2019 Document Reviewed: 07/28/2019 Elsevier Patient Education  [place]. Stroke Prevention Some medical conditions and behaviors are associated with a higher chance of having a stroke. You can help prevent a stroke by  making nutrition, lifestyle, and other changes, including managing any medical conditions you may have. What nutrition changes can be made?  Eat healthy foods. You can do this by: Choosing foods high in fiber, such as fresh fruits and vegetables and whole grains. Eating at least 5 or more servings of fruits and vegetables a day. Try to fill half of your plate at each meal with fruits and vegetables. Choosing lean protein foods, such as lean cuts of meat, poultry without skin, fish, tofu, beans, and nuts. Eating low-fat dairy products. Avoiding foods that are high in salt (sodium). This can help lower blood pressure. Avoiding foods that have saturated fat, trans fat, and cholesterol. This can help prevent high cholesterol. Avoiding processed and premade foods. Follow your health care provider's specific guidelines for losing weight, controlling high blood pressure (hypertension), lowering high cholesterol, and managing diabetes. These may include: Reducing your daily calorie intake. Limiting your daily sodium intake to  1,500 milligrams (mg). Using only healthy fats for cooking, such as olive oil, canola oil, or sunflower oil. Counting your daily carbohydrate intake. What lifestyle changes can be made? Maintain a healthy weight. Talk to your health care provider about your ideal weight. Get at least 30 minutes of moderate physical activity at least 5 days a week. Moderate activity includes brisk walking, biking, and swimming. Do not use any products that contain nicotine or tobacco, such as cigarettes and e-cigarettes. If you need help quitting, ask your health care provider. It may also be helpful to avoid exposure to secondhand smoke. Limit alcohol intake to no more than 1 drink a day for nonpregnant women and 2 drinks a day for men. One drink equals 12 oz of beer, 5 oz of wine, or 1 oz of hard liquor. Stop any illegal drug use. Avoid taking birth control pills. Talk to your health care  provider about the risks of taking birth control pills if: You are over 88 years old. You smoke. You get migraines. You have ever had a blood clot. What other changes can be made? Manage your cholesterol levels. Eating a healthy diet is important for preventing high cholesterol. If cholesterol cannot be managed through diet alone, you may also need to take medicines. Take any prescribed medicines to control your cholesterol as told by your health care provider. Manage your diabetes. Eating a healthy diet and exercising regularly are important parts of managing your blood sugar. If your blood sugar cannot be managed through diet and exercise, you may need to take medicines. Take any prescribed medicines to control your diabetes as told by your health care provider. Control your hypertension. To reduce your risk of stroke, try to keep your blood pressure below 130/80. Eating a healthy diet and exercising regularly are an important part of controlling your blood pressure. If your blood pressure cannot be managed through diet and exercise, you may need to take medicines. Take any prescribed medicines to control hypertension as told by your health care provider. Ask your health care provider if you should monitor your blood pressure at home. Have your blood pressure checked every year, even if your blood pressure is normal. Blood pressure increases with age and some medical conditions. Get evaluated for sleep disorders (sleep apnea). Talk to your health care provider about getting a sleep evaluation if you snore a lot or have excessive sleepiness. Take over-the-counter and prescription medicines only as told by your health care provider. Aspirin or blood thinners (antiplatelets or anticoagulants) may be recommended to reduce your risk of forming blood clots that can lead to stroke. Make sure that any other medical conditions you have, such as atrial fibrillation or atherosclerosis, are managed. What  are the warning signs of a stroke? The warning signs of a stroke can be easily remembered as BEFAST. B is for balance. Signs include: Dizziness. Loss of balance or coordination. Sudden trouble walking. E is for eyes. Signs include: A sudden change in vision. Trouble seeing. F is for face. Signs include: Sudden weakness or numbness of the face. The face or eyelid drooping to one side. A is for arms. Signs include: Sudden weakness or numbness of the arm, usually on one side of the body. S is for speech. Signs include: Trouble speaking (aphasia). Trouble understanding. T is for time. These symptoms may represent a serious problem that is an emergency. Do not wait to see if the symptoms will go away. Get medical help right away. Call your local  emergency services (911 in the [place] Do not drive yourself to the hospital. Other signs of stroke may include: A sudden, severe headache with no known cause. Nausea or vomiting. Seizure. Where to find more information For more information, visit: American Stroke Association: www.strokeassociation.org National Stroke Association: www.stroke.org Summary You can prevent a stroke by eating healthy, exercising, not smoking, limiting alcohol intake, and managing any medical conditions you may have. Do not use any products that contain nicotine or tobacco, such as cigarettes and e-cigarettes. If you need help quitting, ask your health care provider. It may also be helpful to avoid exposure to secondhand smoke. Remember BEFAST for warning signs of stroke. Get help right away if you or a loved one has any of these signs. This information is not intended to replace advice given to you by your health care provider. Make sure you discuss any questions you have with your health care provider. Document Revised: 12/06/2016 Document Reviewed: 01/30/2016 Elsevier Patient Education  2021 [employer].

## 2020-10-13 DIAGNOSIS — I10 Essential (primary) hypertension: Secondary | ICD-10-CM | POA: Diagnosis not present

## 2020-10-13 DIAGNOSIS — S72141D Displaced intertrochanteric fracture of right femur, subsequent encounter for closed fracture with routine healing: Secondary | ICD-10-CM | POA: Diagnosis not present

## 2020-10-13 DIAGNOSIS — M8580 Other specified disorders of bone density and structure, unspecified site: Secondary | ICD-10-CM | POA: Diagnosis not present

## 2020-10-13 DIAGNOSIS — E039 Hypothyroidism, unspecified: Secondary | ICD-10-CM | POA: Diagnosis not present

## 2020-10-13 DIAGNOSIS — H9190 Unspecified hearing loss, unspecified ear: Secondary | ICD-10-CM | POA: Diagnosis not present

## 2020-10-13 DIAGNOSIS — Z9181 History of falling: Secondary | ICD-10-CM | POA: Diagnosis not present

## 2020-10-13 DIAGNOSIS — M1990 Unspecified osteoarthritis, unspecified site: Secondary | ICD-10-CM | POA: Diagnosis not present

## 2020-10-20 DIAGNOSIS — I10 Essential (primary) hypertension: Secondary | ICD-10-CM | POA: Diagnosis not present

## 2020-10-20 DIAGNOSIS — H9190 Unspecified hearing loss, unspecified ear: Secondary | ICD-10-CM | POA: Diagnosis not present

## 2020-10-20 DIAGNOSIS — M1990 Unspecified osteoarthritis, unspecified site: Secondary | ICD-10-CM | POA: Diagnosis not present

## 2020-10-20 DIAGNOSIS — E039 Hypothyroidism, unspecified: Secondary | ICD-10-CM | POA: Diagnosis not present

## 2020-10-20 DIAGNOSIS — M8580 Other specified disorders of bone density and structure, unspecified site: Secondary | ICD-10-CM | POA: Diagnosis not present

## 2020-10-20 DIAGNOSIS — S72141D Displaced intertrochanteric fracture of right femur, subsequent encounter for closed fracture with routine healing: Secondary | ICD-10-CM | POA: Diagnosis not present

## 2020-10-20 DIAGNOSIS — Z9181 History of falling: Secondary | ICD-10-CM | POA: Diagnosis not present

## 2020-11-01 DIAGNOSIS — Z9181 History of falling: Secondary | ICD-10-CM | POA: Diagnosis not present

## 2020-11-01 DIAGNOSIS — S72141D Displaced intertrochanteric fracture of right femur, subsequent encounter for closed fracture with routine healing: Secondary | ICD-10-CM | POA: Diagnosis not present

## 2020-11-01 DIAGNOSIS — H9190 Unspecified hearing loss, unspecified ear: Secondary | ICD-10-CM | POA: Diagnosis not present

## 2020-11-01 DIAGNOSIS — M8580 Other specified disorders of bone density and structure, unspecified site: Secondary | ICD-10-CM | POA: Diagnosis not present

## 2020-11-01 DIAGNOSIS — M1990 Unspecified osteoarthritis, unspecified site: Secondary | ICD-10-CM | POA: Diagnosis not present

## 2020-11-01 DIAGNOSIS — I10 Essential (primary) hypertension: Secondary | ICD-10-CM | POA: Diagnosis not present

## 2020-11-01 DIAGNOSIS — E039 Hypothyroidism, unspecified: Secondary | ICD-10-CM | POA: Diagnosis not present

## 2020-11-02 DIAGNOSIS — H9190 Unspecified hearing loss, unspecified ear: Secondary | ICD-10-CM | POA: Diagnosis not present

## 2020-11-02 DIAGNOSIS — Z9181 History of falling: Secondary | ICD-10-CM | POA: Diagnosis not present

## 2020-11-02 DIAGNOSIS — M8580 Other specified disorders of bone density and structure, unspecified site: Secondary | ICD-10-CM | POA: Diagnosis not present

## 2020-11-02 DIAGNOSIS — S72141D Displaced intertrochanteric fracture of right femur, subsequent encounter for closed fracture with routine healing: Secondary | ICD-10-CM | POA: Diagnosis not present

## 2020-11-02 DIAGNOSIS — E039 Hypothyroidism, unspecified: Secondary | ICD-10-CM | POA: Diagnosis not present

## 2020-11-02 DIAGNOSIS — M1990 Unspecified osteoarthritis, unspecified site: Secondary | ICD-10-CM | POA: Diagnosis not present

## 2020-11-02 DIAGNOSIS — I10 Essential (primary) hypertension: Secondary | ICD-10-CM | POA: Diagnosis not present

## 2020-11-07 DIAGNOSIS — Z4789 Encounter for other orthopedic aftercare: Secondary | ICD-10-CM | POA: Diagnosis not present

## 2020-11-08 DIAGNOSIS — M8580 Other specified disorders of bone density and structure, unspecified site: Secondary | ICD-10-CM | POA: Diagnosis not present

## 2020-11-08 DIAGNOSIS — I10 Essential (primary) hypertension: Secondary | ICD-10-CM | POA: Diagnosis not present

## 2020-11-08 DIAGNOSIS — M1990 Unspecified osteoarthritis, unspecified site: Secondary | ICD-10-CM | POA: Diagnosis not present

## 2020-11-08 DIAGNOSIS — R2689 Other abnormalities of gait and mobility: Secondary | ICD-10-CM | POA: Diagnosis not present

## 2020-11-08 DIAGNOSIS — M6281 Muscle weakness (generalized): Secondary | ICD-10-CM | POA: Diagnosis not present

## 2020-11-08 DIAGNOSIS — E039 Hypothyroidism, unspecified: Secondary | ICD-10-CM | POA: Diagnosis not present

## 2020-11-08 DIAGNOSIS — Z9181 History of falling: Secondary | ICD-10-CM | POA: Diagnosis not present

## 2020-11-08 DIAGNOSIS — S72141D Displaced intertrochanteric fracture of right femur, subsequent encounter for closed fracture with routine healing: Secondary | ICD-10-CM | POA: Diagnosis not present

## 2020-11-08 DIAGNOSIS — H9190 Unspecified hearing loss, unspecified ear: Secondary | ICD-10-CM | POA: Diagnosis not present

## 2020-12-08 DIAGNOSIS — M6281 Muscle weakness (generalized): Secondary | ICD-10-CM | POA: Diagnosis not present

## 2020-12-08 DIAGNOSIS — R2689 Other abnormalities of gait and mobility: Secondary | ICD-10-CM | POA: Diagnosis not present

## 2021-01-08 DIAGNOSIS — M6281 Muscle weakness (generalized): Secondary | ICD-10-CM | POA: Diagnosis not present

## 2021-01-08 DIAGNOSIS — R2689 Other abnormalities of gait and mobility: Secondary | ICD-10-CM | POA: Diagnosis not present

## 2021-01-10 ENCOUNTER — Ambulatory Visit: Payer: PPO | Admitting: Adult Health

## 2021-01-10 ENCOUNTER — Encounter: Payer: Self-pay | Admitting: Adult Health

## 2021-01-10 VITALS — BP 132/84 | HR 100 | Ht 62.0 in | Wt 115.0 lb

## 2021-01-10 DIAGNOSIS — G3184 Mild cognitive impairment, so stated: Secondary | ICD-10-CM

## 2021-01-10 DIAGNOSIS — G459 Transient cerebral ischemic attack, unspecified: Secondary | ICD-10-CM

## 2021-01-10 DIAGNOSIS — Z8673 Personal history of transient ischemic attack (TIA), and cerebral infarction without residual deficits: Secondary | ICD-10-CM

## 2021-01-10 NOTE — Progress Notes (Signed)
[facility] [address] [place]. [place] [address] [phone]       OFFICE FOLLOW UP NOTE  Ms. SYNCERE KAMINSKI Date of Birth:  [birth year] Medical Record Number:  [id number]   Referring MD: [name] Reason for Referral: TIA   Chief Complaint  Patient presents with   Follow-up    Rm 2 with Daughter-in-law, reports she has been doing well.       HPI:   Update, 01/11/2020 JM: Ms Broaden is here today for a stroke/seizure follow-up accompanied by daughter-in-law. Denies new stroke/TIA symptoms. No seizures. No new falls. PT/OT completed, but she does perform exercises occasionally. She continues to use a Rollator walker. She continues to live alone with overnight caregivers - DIL reports "clumsiness", and "slow" with performing some ADLs and requires assistance with IADLs. Remains on Plavix without side effects, Aspirin DC by PCP with burst eye vessel and associated black eye (was not previously stopped as Dr. Leonie Man recommended). BP today 132/84. Labs A1c 5.8, LDL 69, and B12 1,201 08/15/2020, has labs scheduled for next week at PCP.  No new concerns at this time   History provided for reference purposes only Update 10/11/2020 PS: Ms. Clagett is a pleasant 88 year old Caucasian lady seen today for consultation visit for TIA.  She is accompanied by her daughter.  History is obtained from them and review of electronic medical records and I personally reviewed pertinent imaging films in PACS.88 year old female with a PMHx of TIA, vitamine B12 deficiency, mild cognitive impairment, hearing loss, hypothyroidism, osteopenia and tremor who presented to the hospital on 08/12/20 after tripping and falling at home, fracturing her right hip (displaced right intertrochanteric femur fracture). She underwent intramedullary nailing on 8/7 /22 and since then has been able to ambulate. On 08/15/20 morning, she was noted by daughter to suddenly have slurred speech and right facial droop. LKN 11  AM. Code Stroke was called. She was on ASA CT scan of the head showed no acute abnormality with aspect score of 10.  CT angiogram of the brain and neck showed mild less than 50% bilateral carotid bifurcation stenosis with moderate left vertebral artery origin stenosis and moderate right paraclinoid and mild left paraclinoid stenosis.  Echocardiogram showed ejection fraction of 50 to 55% without definite cardiac source of embolism.  LDL cholesterol 69 mg percent hemoglobin A1c was 5.8.  Patient symptoms improved by the time she came back from CT scan.  She was started on dual antiplatelet therapy of aspirin and Plavix.  Patient has since done well she has been home and getting home physical Occupational Therapy.  She is able to ambulate with a Rollator.  She can walk short distances by herself.  She has had no recurrent TIA or stroke symptoms. Patient has previously been seen in the office and she had an episode on 05/05/2019 was felt to be complex partial seizure versus a TIA.  She also had mild age-related memory loss and cognitive impairment for which she takes Prevagen.  She is fairly independent despite her age lives alone and daughter lives nearby and provides some help which is mostly supervision.  She has had 2 or 3 falls in the last few years most of these are mechanical.  She denies any progressive memory loss or cognitive impairment or incontinence.  Her MRI scan on 08/16/2020 had shown no acute abnormality but generalized atrophy and white matter changes as well as slight ventriculomegaly out of proportion to the atrophy but this appeared unchanged  from previous MRI from 07/02/2019.[name]   Update 02/28/2020 JM: Mrs. Teichert returns for 86-month follow-up accompanied by daughter-in-law.   No new or reoccurring stroke/TIA symptoms Denies additional transient speech and AMS episodes Cognition stable without worsening and remains on Prevagen  Reports compliance on aspirin 81 mg daily -denies side  effects Blood pressure today 138/78 Recent lipid panel showed LDL 84 (01/13/2020) Remains on B12 for B12 deficiency  Update 08/25/2019 JM: Ms. Benda is being seen for 86-month follow-up accompanied by her daughter in law.   Previously seen by Dr. Leonie Man for transient speech and altered awareness episode of unknown etiology.   She has not had additional transient episodes or new neurological symptoms Cognition has been stable with MMSE today 23/30 Remains on Prevagen tolerating well Further extensive work-up including EEG, MRI, MRA and lab work completed.   Dementia panel did show B12 deficiency at 40 -advised to follow-up with PCP MR brain w wo contrast showed ventriculomegaly but felt likely chronic with history of meningitis All other work-up unremarkable No concerns at this time  Initial consult visit 06/21/2019 Dr. Leonie Man: Ms. Breault is a pleasant 88 year old Caucasian lady seen today for initial office consultation visit. She is accompanied by her daughter-in-law. History is obtained from them and review of referral notes. I have personally reviewed imaging studies in PACS and electronic medical records. Patient states that on 05/05/2019 she and her granddaughter cleaned out some closets in her house and after that she felt tired and wanted to take a nap in a chair. Her other granddaughter called on the phone and when she was given the phone to speak she had trouble speaking and was talking gibberish and garbled speech. She was also not able to comprehend what granddaughter and daughter-in-law spoke to her. Her symptoms are improving gradually took about 30 minutes. She did not go to the hospital but felt okay for the rest of the day. She was a little slow in thinking for a little while but that improved. She subsequently saw her primary care physician who ordered a CT scan of the head which was done on 05/20/2019 which shows enlarged ventricular size and some periventricular lucencies. 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 There is no acute abnormality. Carotid ultrasound on the same day shows bilateral carotid bifurcation plaques with less than 50% stenosis. Patient denies any known prior history of strokes, TIAs, seizures significant head injury with loss of consciousness. She however does remember his childhood illness possibly diphtheria which she was quite sick and had altered mental status for several days. [place] had to go to another city to get her some medications presumably antibiotics which worked and she gradually got better. She did not have any brain imaging at that time. The patient states she has been having some mild short-term memory difficulties over the years which may be progressing slightly. She does have some sundowning and gets irritated very easily and the daughter-in-law state that she tries to speak to her and get work done mostly in the morning part of the day. Patient denies any symptoms of gait difficulties gait ataxia falls or injuries. She walks quite well with good balance independently and very rarely needs a cane. She is independent and active daily living and recently has stopped driving. She denies any bladder urgency, frequency or incontinence. She has been referred to see neurosurgeon to discuss abnormal CT scan findings and normal pressure hydrocephalus  ROS:   14 system review of systems is positive for memory loss and all other systems negative  PMH:  Past Medical History:  Diagnosis Date   Hearing loss    Hypothyroidism    Neutropenia ([place])    Osteopenia    Primary osteoarthritis    Tremor     Social History:  Social History   Socioeconomic History   Marital status: Widowed    Spouse name: Not on file   Number of children: Not on file   Years of education: Not on file   Highest education level: Not on file  Occupational History   Not on file  Tobacco Use   Smoking  status: Never   Smokeless tobacco: Never  Substance and Sexual Activity   Alcohol use: Yes    Alcohol/week: 1.0 standard drink    Types: 1 Glasses of wine per week    Comment: bourbon per day one cup prn   Drug use: Not Currently   Sexual activity: Not Currently  Other Topics Concern   Not on file  Social History Narrative   Not on file   Social Determinants of Health   Financial Resource Strain: Not on file  Food Insecurity: Not on file  Transportation Needs: Not on file  Physical Activity: Not on file  Stress: Not on file  Social Connections: Not on file  Intimate Partner Violence: Not on file    Medications:   Current Outpatient Medications on File Prior to Visit  Medication Sig Dispense Refill   amLODipine (NORVASC) 2.5 MG tablet Take 1 tablet (2.5 mg total) by mouth every evening. 30 tablet 0   Apoaequorin (PREVAGEN) 10 MG CAPS Take 1 capsule by mouth every morning. 1 capsule 1   Cholecalciferol (VITAMIN D3) 50 MCG (2000 UT) TABS Take 2,000 Units by mouth daily.     clopidogrel (PLAVIX) 75 MG tablet Take 1 tablet (75 mg total) by mouth daily. 30 tablet 0   ferrous sulfate 325 (65 FE) MG tablet Take 1 tablet (325 mg total) by mouth in the morning and at bedtime. 60 tablet 0   levothyroxine (SYNTHROID) 125 MCG tablet Take 1 tablet (125 mcg total) by mouth daily before breakfast. 30 tablet 0   magnesium oxide (MAG-OX) 400 MG tablet Take 1 tablet (400 mg total) by mouth daily. 30 tablet 0   NON FORMULARY Diet:NAS     vitamin B-12 (CYANOCOBALAMIN) 100 MCG tablet Take 100 mcg by mouth daily.     vitamin C (ASCORBIC ACID) 500 MG tablet Take 1 tablet (500 mg total) by mouth 2 (two) times daily. 60 tablet 0   No current facility-administered medications on file prior to visit.    Allergies:   Allergies  Allergen Reactions   Codeine Other (See Comments)    Extreme headaches   Shellfish Allergy Hives   Shellfish-Derived Products Hives   Amoxicillin Itching and Rash     Today's Vitals   01/10/21 0810  BP: 132/84  Pulse: 100  SpO2: 98%  Weight: 115 lb (52.2 kg)  Height: 5\' 2"  (1.575 m)    Body mass index is 21.03 kg/m.  Physical Exam General: Frail very pleasant elderly Caucasian lady, seated, in no evident distress Head: head normocephalic and atraumatic.   Neck: supple with no carotid or supraclavicular bruits Cardiovascular: regular rate and rhythm, no murmurs Musculoskeletal: no deformity Skin:  no rash few scattered petichiae Vascular:  Normal pulses all extremities  Neurologic Exam Mental Status: Awake and fully alert.  Fluent speech and language.  Oriented to place and time. Recent and remote memory diminished. Attention span, concentration and fund of knowledge diminished mood and affect appropriate.  Cranial Nerves: Pupils equal, briskly reactive to light. Extraocular movements full without nystagmus. Visual fields full to confrontation. Hearing diminished bilaterally despite hearing aid. Facial sensation intact. Face, tongue, palate moves normally and symmetrically.  Motor: Normal bulk and tone. Normal strength in all tested extremity muscles. Sensory.: intact to touch , pinprick , position and vibratory sensation.  Coordination: Rapid alternating movements normal in all extremities. Finger-to-nose and heel-to-shin performed accurately bilaterally. Gait and Station: Arises from chair without difficulty. Stance is normal. Gait demonstrates normal balance mild favoring of right leg. She uses a rollator walker. Reflexes: 1+ and symmetric. Toes downgoing.      ASSESSMENT/PLAN: 88 year old pleasant Caucasian lady with transient episode of speech and language difficulties with altered awareness in 04/2019 of unclear etiology possibilities include left hemispheric TIA versus complex partial seizure and transient episode of slurred speech and right facial droop in 08/2020. Vascular risk factors of hypertension, intra and extracranial stenosis  and age.  She also has mild cognitive impairment which is age-appropriate and stable.  Her brain scans have shown ventriculomegaly out of proportion to the degree of atrophy but her clinical presentation is not compatible with normal pressure hydrocephalus and prominent ventricles have been documented in her prior imaging studies going as far back as MRI in 2002.     History of stroke/TIA -Continue Plavix 75mg  daily for secondary stroke -maintain aggressive risk factor modification with strict control of hypertension with blood pressure goal below 130/90, lipids with LDL cholesterol goal below 70 mg percent and maintain a healthy diet and be active and exercise regularly.    Mild cognitive impairment -Stable -Discussed importance of memory exercises, healthy diet, routine exercise and adequate sleep    Follow up in 6 months or call earlier if needed   CC:  [name], MD    I spent 34 minutes of face-to-face and non-face-to-face time with patient and DIL.  This included previsit chart review, lab review, study review, electronic health record documentation, patient and DIL education regarding prior hx stroke/TIA history, mild cognitive impairment, importance of managing stroke risk factors and answered all other questions to patient and daughter in laws [place], [facility]  [facility] [address] [place], [place] [address]  Phone [phone] Fax [phone] Note: This document was prepared with digital dictation and possible smart phrase technology. Any transcriptional errors that result from this process are unintentional.

## 2021-01-10 NOTE — Patient Instructions (Addendum)
Continue clopidogrel 75 mg daily  for secondary stroke prevention  Continue to follow up with PCP regarding cholesterol and blood pressure management/monitoring  Maintain strict control of hypertension with blood pressure goal below 130/90 and cholesterol with LDL cholesterol (bad cholesterol) goal below 70 mg/dL.   Signs of a Stroke? Follow the BEFAST method:  Balance Watch for a sudden loss of balance, trouble with coordination or vertigo Eyes Is there a sudden loss of vision in one or both eyes? Or double vision?  Face: Ask the person to smile. Does one side of the face droop or is it numb?  Arms: Ask the person to raise both arms. Does one arm drift downward? Is there weakness or numbness of a leg? Speech: Ask the person to repeat a simple phrase. Does the speech sound slurred/strange? Is the person confused ? Time: If you observe any of these signs, call 911.     Followup in the future with me in 6 months or call earlier if needed - if remains stable, can follow up as needed at that time       Thank you for coming to see [place] at [facility]. I hope we have been able to provide you high quality care today.  You may receive a patient satisfaction survey over the next few weeks. We would appreciate your feedback and comments so that we may continue to improve ourselves and the health of our patients.

## 2021-01-19 DIAGNOSIS — I10 Essential (primary) hypertension: Secondary | ICD-10-CM | POA: Diagnosis not present

## 2021-01-19 DIAGNOSIS — M8580 Other specified disorders of bone density and structure, unspecified site: Secondary | ICD-10-CM | POA: Diagnosis not present

## 2021-01-19 DIAGNOSIS — E559 Vitamin D deficiency, unspecified: Secondary | ICD-10-CM | POA: Diagnosis not present

## 2021-01-19 DIAGNOSIS — Z0000 Encounter for general adult medical examination without abnormal findings: Secondary | ICD-10-CM | POA: Diagnosis not present

## 2021-01-19 DIAGNOSIS — E611 Iron deficiency: Secondary | ICD-10-CM | POA: Diagnosis not present

## 2021-01-24 DIAGNOSIS — E538 Deficiency of other specified B group vitamins: Secondary | ICD-10-CM | POA: Diagnosis not present

## 2021-01-24 DIAGNOSIS — G459 Transient cerebral ischemic attack, unspecified: Secondary | ICD-10-CM | POA: Diagnosis not present

## 2021-01-24 DIAGNOSIS — Z880 Allergy status to penicillin: Secondary | ICD-10-CM | POA: Diagnosis not present

## 2021-01-24 DIAGNOSIS — D508 Other iron deficiency anemias: Secondary | ICD-10-CM | POA: Diagnosis not present

## 2021-01-24 DIAGNOSIS — Z0000 Encounter for general adult medical examination without abnormal findings: Secondary | ICD-10-CM | POA: Diagnosis not present

## 2021-01-24 DIAGNOSIS — M8580 Other specified disorders of bone density and structure, unspecified site: Secondary | ICD-10-CM | POA: Diagnosis not present

## 2021-01-24 DIAGNOSIS — E039 Hypothyroidism, unspecified: Secondary | ICD-10-CM | POA: Diagnosis not present

## 2021-01-24 DIAGNOSIS — I10 Essential (primary) hypertension: Secondary | ICD-10-CM | POA: Diagnosis not present

## 2021-01-24 DIAGNOSIS — Z23 Encounter for immunization: Secondary | ICD-10-CM | POA: Diagnosis not present

## 2021-02-08 DIAGNOSIS — R2689 Other abnormalities of gait and mobility: Secondary | ICD-10-CM | POA: Diagnosis not present

## 2021-02-08 DIAGNOSIS — M6281 Muscle weakness (generalized): Secondary | ICD-10-CM | POA: Diagnosis not present

## 2021-02-14 DIAGNOSIS — Z8673 Personal history of transient ischemic attack (TIA), and cerebral infarction without residual deficits: Secondary | ICD-10-CM | POA: Diagnosis not present

## 2021-02-14 DIAGNOSIS — E538 Deficiency of other specified B group vitamins: Secondary | ICD-10-CM | POA: Diagnosis not present

## 2021-02-14 DIAGNOSIS — R251 Tremor, unspecified: Secondary | ICD-10-CM | POA: Diagnosis not present

## 2021-02-14 DIAGNOSIS — I10 Essential (primary) hypertension: Secondary | ICD-10-CM | POA: Diagnosis not present

## 2021-02-14 DIAGNOSIS — Z880 Allergy status to penicillin: Secondary | ICD-10-CM | POA: Diagnosis not present

## 2021-02-14 DIAGNOSIS — M8580 Other specified disorders of bone density and structure, unspecified site: Secondary | ICD-10-CM | POA: Diagnosis not present

## 2021-02-14 DIAGNOSIS — R2681 Unsteadiness on feet: Secondary | ICD-10-CM | POA: Diagnosis not present

## 2021-02-14 DIAGNOSIS — Z9181 History of falling: Secondary | ICD-10-CM | POA: Diagnosis not present

## 2021-02-14 DIAGNOSIS — R29810 Facial weakness: Secondary | ICD-10-CM | POA: Diagnosis not present

## 2021-02-14 DIAGNOSIS — E039 Hypothyroidism, unspecified: Secondary | ICD-10-CM | POA: Diagnosis not present

## 2021-02-14 DIAGNOSIS — Z7902 Long term (current) use of antithrombotics/antiplatelets: Secondary | ICD-10-CM | POA: Diagnosis not present

## 2021-02-14 DIAGNOSIS — D508 Other iron deficiency anemias: Secondary | ICD-10-CM | POA: Diagnosis not present

## 2021-02-14 DIAGNOSIS — H9190 Unspecified hearing loss, unspecified ear: Secondary | ICD-10-CM | POA: Diagnosis not present

## 2021-02-19 DIAGNOSIS — E039 Hypothyroidism, unspecified: Secondary | ICD-10-CM | POA: Diagnosis not present

## 2021-02-19 DIAGNOSIS — E538 Deficiency of other specified B group vitamins: Secondary | ICD-10-CM | POA: Diagnosis not present

## 2021-02-19 DIAGNOSIS — D508 Other iron deficiency anemias: Secondary | ICD-10-CM | POA: Diagnosis not present

## 2021-02-19 DIAGNOSIS — I10 Essential (primary) hypertension: Secondary | ICD-10-CM | POA: Diagnosis not present

## 2021-03-05 DIAGNOSIS — D508 Other iron deficiency anemias: Secondary | ICD-10-CM | POA: Diagnosis not present

## 2021-03-05 DIAGNOSIS — E039 Hypothyroidism, unspecified: Secondary | ICD-10-CM | POA: Diagnosis not present

## 2021-03-05 DIAGNOSIS — Z880 Allergy status to penicillin: Secondary | ICD-10-CM | POA: Diagnosis not present

## 2021-03-05 DIAGNOSIS — Z8673 Personal history of transient ischemic attack (TIA), and cerebral infarction without residual deficits: Secondary | ICD-10-CM | POA: Diagnosis not present

## 2021-03-05 DIAGNOSIS — R2681 Unsteadiness on feet: Secondary | ICD-10-CM | POA: Diagnosis not present

## 2021-03-05 DIAGNOSIS — E538 Deficiency of other specified B group vitamins: Secondary | ICD-10-CM | POA: Diagnosis not present

## 2021-03-05 DIAGNOSIS — Z7902 Long term (current) use of antithrombotics/antiplatelets: Secondary | ICD-10-CM | POA: Diagnosis not present

## 2021-03-05 DIAGNOSIS — R251 Tremor, unspecified: Secondary | ICD-10-CM | POA: Diagnosis not present

## 2021-03-05 DIAGNOSIS — Z9181 History of falling: Secondary | ICD-10-CM | POA: Diagnosis not present

## 2021-03-05 DIAGNOSIS — R29810 Facial weakness: Secondary | ICD-10-CM | POA: Diagnosis not present

## 2021-03-05 DIAGNOSIS — I10 Essential (primary) hypertension: Secondary | ICD-10-CM | POA: Diagnosis not present

## 2021-03-05 DIAGNOSIS — M8580 Other specified disorders of bone density and structure, unspecified site: Secondary | ICD-10-CM | POA: Diagnosis not present

## 2021-03-05 DIAGNOSIS — H9190 Unspecified hearing loss, unspecified ear: Secondary | ICD-10-CM | POA: Diagnosis not present

## 2021-03-08 DIAGNOSIS — R2689 Other abnormalities of gait and mobility: Secondary | ICD-10-CM | POA: Diagnosis not present

## 2021-03-08 DIAGNOSIS — M6281 Muscle weakness (generalized): Secondary | ICD-10-CM | POA: Diagnosis not present

## 2021-03-09 DIAGNOSIS — R29810 Facial weakness: Secondary | ICD-10-CM | POA: Diagnosis not present

## 2021-03-09 DIAGNOSIS — R251 Tremor, unspecified: Secondary | ICD-10-CM | POA: Diagnosis not present

## 2021-03-09 DIAGNOSIS — M8580 Other specified disorders of bone density and structure, unspecified site: Secondary | ICD-10-CM | POA: Diagnosis not present

## 2021-03-09 DIAGNOSIS — Z7902 Long term (current) use of antithrombotics/antiplatelets: Secondary | ICD-10-CM | POA: Diagnosis not present

## 2021-03-09 DIAGNOSIS — Z8673 Personal history of transient ischemic attack (TIA), and cerebral infarction without residual deficits: Secondary | ICD-10-CM | POA: Diagnosis not present

## 2021-03-09 DIAGNOSIS — H9190 Unspecified hearing loss, unspecified ear: Secondary | ICD-10-CM | POA: Diagnosis not present

## 2021-03-09 DIAGNOSIS — Z9181 History of falling: Secondary | ICD-10-CM | POA: Diagnosis not present

## 2021-03-09 DIAGNOSIS — E538 Deficiency of other specified B group vitamins: Secondary | ICD-10-CM | POA: Diagnosis not present

## 2021-03-09 DIAGNOSIS — D508 Other iron deficiency anemias: Secondary | ICD-10-CM | POA: Diagnosis not present

## 2021-03-09 DIAGNOSIS — E039 Hypothyroidism, unspecified: Secondary | ICD-10-CM | POA: Diagnosis not present

## 2021-03-09 DIAGNOSIS — R2681 Unsteadiness on feet: Secondary | ICD-10-CM | POA: Diagnosis not present

## 2021-03-09 DIAGNOSIS — Z880 Allergy status to penicillin: Secondary | ICD-10-CM | POA: Diagnosis not present

## 2021-03-09 DIAGNOSIS — I10 Essential (primary) hypertension: Secondary | ICD-10-CM | POA: Diagnosis not present

## 2021-03-12 DIAGNOSIS — R2681 Unsteadiness on feet: Secondary | ICD-10-CM | POA: Diagnosis not present

## 2021-03-12 DIAGNOSIS — H9190 Unspecified hearing loss, unspecified ear: Secondary | ICD-10-CM | POA: Diagnosis not present

## 2021-03-12 DIAGNOSIS — I10 Essential (primary) hypertension: Secondary | ICD-10-CM | POA: Diagnosis not present

## 2021-03-12 DIAGNOSIS — R29810 Facial weakness: Secondary | ICD-10-CM | POA: Diagnosis not present

## 2021-03-12 DIAGNOSIS — M8580 Other specified disorders of bone density and structure, unspecified site: Secondary | ICD-10-CM | POA: Diagnosis not present

## 2021-03-12 DIAGNOSIS — E039 Hypothyroidism, unspecified: Secondary | ICD-10-CM | POA: Diagnosis not present

## 2021-03-12 DIAGNOSIS — R251 Tremor, unspecified: Secondary | ICD-10-CM | POA: Diagnosis not present

## 2021-03-12 DIAGNOSIS — D508 Other iron deficiency anemias: Secondary | ICD-10-CM | POA: Diagnosis not present

## 2021-03-12 DIAGNOSIS — E538 Deficiency of other specified B group vitamins: Secondary | ICD-10-CM | POA: Diagnosis not present

## 2021-03-12 DIAGNOSIS — Z880 Allergy status to penicillin: Secondary | ICD-10-CM | POA: Diagnosis not present

## 2021-03-12 DIAGNOSIS — Z9181 History of falling: Secondary | ICD-10-CM | POA: Diagnosis not present

## 2021-03-12 DIAGNOSIS — Z7902 Long term (current) use of antithrombotics/antiplatelets: Secondary | ICD-10-CM | POA: Diagnosis not present

## 2021-03-12 DIAGNOSIS — Z8673 Personal history of transient ischemic attack (TIA), and cerebral infarction without residual deficits: Secondary | ICD-10-CM | POA: Diagnosis not present

## 2021-03-14 DIAGNOSIS — R251 Tremor, unspecified: Secondary | ICD-10-CM | POA: Diagnosis not present

## 2021-03-14 DIAGNOSIS — Z9181 History of falling: Secondary | ICD-10-CM | POA: Diagnosis not present

## 2021-03-14 DIAGNOSIS — Z7902 Long term (current) use of antithrombotics/antiplatelets: Secondary | ICD-10-CM | POA: Diagnosis not present

## 2021-03-14 DIAGNOSIS — E538 Deficiency of other specified B group vitamins: Secondary | ICD-10-CM | POA: Diagnosis not present

## 2021-03-14 DIAGNOSIS — R2681 Unsteadiness on feet: Secondary | ICD-10-CM | POA: Diagnosis not present

## 2021-03-14 DIAGNOSIS — R29810 Facial weakness: Secondary | ICD-10-CM | POA: Diagnosis not present

## 2021-03-14 DIAGNOSIS — D508 Other iron deficiency anemias: Secondary | ICD-10-CM | POA: Diagnosis not present

## 2021-03-14 DIAGNOSIS — Z880 Allergy status to penicillin: Secondary | ICD-10-CM | POA: Diagnosis not present

## 2021-03-14 DIAGNOSIS — E039 Hypothyroidism, unspecified: Secondary | ICD-10-CM | POA: Diagnosis not present

## 2021-03-14 DIAGNOSIS — M8580 Other specified disorders of bone density and structure, unspecified site: Secondary | ICD-10-CM | POA: Diagnosis not present

## 2021-03-14 DIAGNOSIS — Z8673 Personal history of transient ischemic attack (TIA), and cerebral infarction without residual deficits: Secondary | ICD-10-CM | POA: Diagnosis not present

## 2021-03-14 DIAGNOSIS — I10 Essential (primary) hypertension: Secondary | ICD-10-CM | POA: Diagnosis not present

## 2021-03-14 DIAGNOSIS — H9190 Unspecified hearing loss, unspecified ear: Secondary | ICD-10-CM | POA: Diagnosis not present

## 2021-03-19 DIAGNOSIS — E039 Hypothyroidism, unspecified: Secondary | ICD-10-CM | POA: Diagnosis not present

## 2021-03-19 DIAGNOSIS — Z880 Allergy status to penicillin: Secondary | ICD-10-CM | POA: Diagnosis not present

## 2021-03-19 DIAGNOSIS — D508 Other iron deficiency anemias: Secondary | ICD-10-CM | POA: Diagnosis not present

## 2021-03-19 DIAGNOSIS — Z9181 History of falling: Secondary | ICD-10-CM | POA: Diagnosis not present

## 2021-03-19 DIAGNOSIS — Z8673 Personal history of transient ischemic attack (TIA), and cerebral infarction without residual deficits: Secondary | ICD-10-CM | POA: Diagnosis not present

## 2021-03-19 DIAGNOSIS — Z7902 Long term (current) use of antithrombotics/antiplatelets: Secondary | ICD-10-CM | POA: Diagnosis not present

## 2021-03-19 DIAGNOSIS — E538 Deficiency of other specified B group vitamins: Secondary | ICD-10-CM | POA: Diagnosis not present

## 2021-03-19 DIAGNOSIS — R251 Tremor, unspecified: Secondary | ICD-10-CM | POA: Diagnosis not present

## 2021-03-19 DIAGNOSIS — R2681 Unsteadiness on feet: Secondary | ICD-10-CM | POA: Diagnosis not present

## 2021-03-19 DIAGNOSIS — I10 Essential (primary) hypertension: Secondary | ICD-10-CM | POA: Diagnosis not present

## 2021-03-19 DIAGNOSIS — M8580 Other specified disorders of bone density and structure, unspecified site: Secondary | ICD-10-CM | POA: Diagnosis not present

## 2021-03-19 DIAGNOSIS — R29810 Facial weakness: Secondary | ICD-10-CM | POA: Diagnosis not present

## 2021-03-19 DIAGNOSIS — H9190 Unspecified hearing loss, unspecified ear: Secondary | ICD-10-CM | POA: Diagnosis not present

## 2021-03-21 DIAGNOSIS — I10 Essential (primary) hypertension: Secondary | ICD-10-CM | POA: Diagnosis not present

## 2021-03-21 DIAGNOSIS — Z7902 Long term (current) use of antithrombotics/antiplatelets: Secondary | ICD-10-CM | POA: Diagnosis not present

## 2021-03-21 DIAGNOSIS — Z9181 History of falling: Secondary | ICD-10-CM | POA: Diagnosis not present

## 2021-03-21 DIAGNOSIS — M8580 Other specified disorders of bone density and structure, unspecified site: Secondary | ICD-10-CM | POA: Diagnosis not present

## 2021-03-21 DIAGNOSIS — Z880 Allergy status to penicillin: Secondary | ICD-10-CM | POA: Diagnosis not present

## 2021-03-21 DIAGNOSIS — R2681 Unsteadiness on feet: Secondary | ICD-10-CM | POA: Diagnosis not present

## 2021-03-21 DIAGNOSIS — E039 Hypothyroidism, unspecified: Secondary | ICD-10-CM | POA: Diagnosis not present

## 2021-03-21 DIAGNOSIS — R251 Tremor, unspecified: Secondary | ICD-10-CM | POA: Diagnosis not present

## 2021-03-21 DIAGNOSIS — H9190 Unspecified hearing loss, unspecified ear: Secondary | ICD-10-CM | POA: Diagnosis not present

## 2021-03-21 DIAGNOSIS — R29810 Facial weakness: Secondary | ICD-10-CM | POA: Diagnosis not present

## 2021-03-21 DIAGNOSIS — Z8673 Personal history of transient ischemic attack (TIA), and cerebral infarction without residual deficits: Secondary | ICD-10-CM | POA: Diagnosis not present

## 2021-03-21 DIAGNOSIS — D508 Other iron deficiency anemias: Secondary | ICD-10-CM | POA: Diagnosis not present

## 2021-03-21 DIAGNOSIS — E538 Deficiency of other specified B group vitamins: Secondary | ICD-10-CM | POA: Diagnosis not present

## 2021-03-28 DIAGNOSIS — E039 Hypothyroidism, unspecified: Secondary | ICD-10-CM | POA: Diagnosis not present

## 2021-03-28 DIAGNOSIS — Z8673 Personal history of transient ischemic attack (TIA), and cerebral infarction without residual deficits: Secondary | ICD-10-CM | POA: Diagnosis not present

## 2021-03-28 DIAGNOSIS — Z7902 Long term (current) use of antithrombotics/antiplatelets: Secondary | ICD-10-CM | POA: Diagnosis not present

## 2021-03-28 DIAGNOSIS — R2681 Unsteadiness on feet: Secondary | ICD-10-CM | POA: Diagnosis not present

## 2021-03-28 DIAGNOSIS — Z9181 History of falling: Secondary | ICD-10-CM | POA: Diagnosis not present

## 2021-03-28 DIAGNOSIS — E538 Deficiency of other specified B group vitamins: Secondary | ICD-10-CM | POA: Diagnosis not present

## 2021-03-28 DIAGNOSIS — D508 Other iron deficiency anemias: Secondary | ICD-10-CM | POA: Diagnosis not present

## 2021-03-28 DIAGNOSIS — H9190 Unspecified hearing loss, unspecified ear: Secondary | ICD-10-CM | POA: Diagnosis not present

## 2021-03-28 DIAGNOSIS — R251 Tremor, unspecified: Secondary | ICD-10-CM | POA: Diagnosis not present

## 2021-03-28 DIAGNOSIS — M8580 Other specified disorders of bone density and structure, unspecified site: Secondary | ICD-10-CM | POA: Diagnosis not present

## 2021-03-28 DIAGNOSIS — Z880 Allergy status to penicillin: Secondary | ICD-10-CM | POA: Diagnosis not present

## 2021-03-28 DIAGNOSIS — R29810 Facial weakness: Secondary | ICD-10-CM | POA: Diagnosis not present

## 2021-03-28 DIAGNOSIS — I10 Essential (primary) hypertension: Secondary | ICD-10-CM | POA: Diagnosis not present

## 2021-04-08 DIAGNOSIS — R2689 Other abnormalities of gait and mobility: Secondary | ICD-10-CM | POA: Diagnosis not present

## 2021-04-08 DIAGNOSIS — M6281 Muscle weakness (generalized): Secondary | ICD-10-CM | POA: Diagnosis not present

## 2021-04-13 DIAGNOSIS — Z880 Allergy status to penicillin: Secondary | ICD-10-CM | POA: Diagnosis not present

## 2021-04-13 DIAGNOSIS — E039 Hypothyroidism, unspecified: Secondary | ICD-10-CM | POA: Diagnosis not present

## 2021-04-13 DIAGNOSIS — E538 Deficiency of other specified B group vitamins: Secondary | ICD-10-CM | POA: Diagnosis not present

## 2021-04-13 DIAGNOSIS — R29810 Facial weakness: Secondary | ICD-10-CM | POA: Diagnosis not present

## 2021-04-13 DIAGNOSIS — R251 Tremor, unspecified: Secondary | ICD-10-CM | POA: Diagnosis not present

## 2021-04-13 DIAGNOSIS — H9190 Unspecified hearing loss, unspecified ear: Secondary | ICD-10-CM | POA: Diagnosis not present

## 2021-04-13 DIAGNOSIS — M8580 Other specified disorders of bone density and structure, unspecified site: Secondary | ICD-10-CM | POA: Diagnosis not present

## 2021-04-13 DIAGNOSIS — Z9181 History of falling: Secondary | ICD-10-CM | POA: Diagnosis not present

## 2021-04-13 DIAGNOSIS — Z7902 Long term (current) use of antithrombotics/antiplatelets: Secondary | ICD-10-CM | POA: Diagnosis not present

## 2021-04-13 DIAGNOSIS — Z8673 Personal history of transient ischemic attack (TIA), and cerebral infarction without residual deficits: Secondary | ICD-10-CM | POA: Diagnosis not present

## 2021-04-13 DIAGNOSIS — R2681 Unsteadiness on feet: Secondary | ICD-10-CM | POA: Diagnosis not present

## 2021-04-13 DIAGNOSIS — I10 Essential (primary) hypertension: Secondary | ICD-10-CM | POA: Diagnosis not present

## 2021-04-13 DIAGNOSIS — D508 Other iron deficiency anemias: Secondary | ICD-10-CM | POA: Diagnosis not present

## 2021-05-08 DIAGNOSIS — R2689 Other abnormalities of gait and mobility: Secondary | ICD-10-CM | POA: Diagnosis not present

## 2021-05-08 DIAGNOSIS — M6281 Muscle weakness (generalized): Secondary | ICD-10-CM | POA: Diagnosis not present

## 2021-06-08 DIAGNOSIS — M6281 Muscle weakness (generalized): Secondary | ICD-10-CM | POA: Diagnosis not present

## 2021-06-08 DIAGNOSIS — R2689 Other abnormalities of gait and mobility: Secondary | ICD-10-CM | POA: Diagnosis not present

## 2021-07-08 DIAGNOSIS — R2689 Other abnormalities of gait and mobility: Secondary | ICD-10-CM | POA: Diagnosis not present

## 2021-07-08 DIAGNOSIS — M6281 Muscle weakness (generalized): Secondary | ICD-10-CM | POA: Diagnosis not present

## 2021-07-12 ENCOUNTER — Ambulatory Visit: Payer: PPO | Admitting: Adult Health

## 2021-07-16 ENCOUNTER — Ambulatory Visit: Payer: PPO | Admitting: Adult Health

## 2021-07-16 ENCOUNTER — Encounter: Payer: Self-pay | Admitting: Adult Health

## 2021-07-16 VITALS — BP 161/84 | HR 87

## 2021-07-16 DIAGNOSIS — Z8673 Personal history of transient ischemic attack (TIA), and cerebral infarction without residual deficits: Secondary | ICD-10-CM | POA: Diagnosis not present

## 2021-07-16 DIAGNOSIS — G459 Transient cerebral ischemic attack, unspecified: Secondary | ICD-10-CM

## 2021-07-16 DIAGNOSIS — R4189 Other symptoms and signs involving cognitive functions and awareness: Secondary | ICD-10-CM | POA: Diagnosis not present

## 2021-07-16 NOTE — Progress Notes (Signed)
[facility] [address] [place]. [place] [address] [phone]       OFFICE FOLLOW UP NOTE  Tanya [name] Date of Birth:  Sep 26, 1925 Medical Record Number:  [id number]   Referring MD: [name] Reason for Referral: TIA   Chief Complaint  Patient presents with   Follow-up    Pt reports feeling fine. Daughter in law does have some concerns regarding her memory. Room 3 with daughter in law      HPI:   Update 07/16/2021 JM: Patient returns for 86-monthfollow-up visit accompanied by her daughter.  Overall stable without new or reoccurring stroke/TIA symptoms or any other transient neurological symptoms. Daughter is concerned regarding gradual memory decline and occasional confusion. Has good days/bad days. Denies any abrupt worsening.  Does continue to live alone, no longer has overnight aide supervision, family lives close by who checks on her frequently and assists with IADLs.  Able to maintain ADLs independently. Some concern of underlying depression. Relatively sedentary with limited physical activity, admits to doing no memory exercises. MMSE today 17/30 (prior 23/30 08/2019). Reports recent lab work with PCP back in May, has f/u visit next week. Compliant on Plavix, denies side effects.  Blood pressure today elevated, has not been routinely monitoring at home. PCP d/c'd amlodipine back in January.  No further concerns at this time.      History provided for reference purposes only Update, 01/11/2020 JM: Tanya THollingworthis here today for a stroke/seizure follow-up accompanied by daughter-in-law. Denies new stroke/TIA symptoms. No seizures. No new falls. PT/OT completed, but she does perform exercises occasionally. She continues to use a Rollator walker. She continues to live alone with overnight caregivers - DIL reports "clumsiness", and "slow" with performing some ADLs and requires assistance with IADLs. Remains on Plavix without side effects, Aspirin DC by PCP with  burst eye vessel and associated black eye (was not previously stopped as Dr. SLeonie Manrecommended). BP today 132/84. Labs A1c 5.8, LDL 69, and B12 1,201 08/15/2020, has labs scheduled for next week at PCP.  No new concerns at this time  Update 10/11/2020 PS: Tanya [name] a pleasant 88year old Caucasian lady seen today for consultation visit for TIA.  She is accompanied by her daughter.  History is obtained from them and review of electronic medical records and I personally reviewed pertinent imaging films in PACS.88year old female with a PMHx of TIA, vitamine B12 deficiency, mild cognitive impairment, hearing loss, hypothyroidism, osteopenia and tremor who presented to the hospital on 08/12/20 after tripping and falling at home, fracturing her right hip (displaced right intertrochanteric femur fracture). She underwent intramedullary nailing on 8/7 /22 and since then has been able to ambulate. On 08/15/20 morning, she was noted by daughter to suddenly have slurred speech and right facial droop. LKN 11 AM. Code Stroke was called. She was on ASA CT scan of the head showed no acute abnormality with aspect score of 10.  CT angiogram of the brain and neck showed mild less than 50% bilateral carotid bifurcation stenosis with moderate left vertebral artery origin stenosis and moderate right paraclinoid and mild left paraclinoid stenosis.  Echocardiogram showed ejection fraction of 50 to 55% without definite cardiac source of embolism.  LDL cholesterol 69 mg percent hemoglobin A1c was 5.8.  Patient symptoms improved by the time she came back from CT scan.  She was started on dual antiplatelet therapy of aspirin and Plavix.  Patient has since done well she has been home and  getting home physical Occupational Therapy.  She is able to ambulate with a Rollator.  She can walk short distances by herself.  She has had no recurrent TIA or stroke symptoms. Patient has previously been seen in the office and she had an episode on  05/05/2019 was felt to be complex partial seizure versus a TIA.  She also had mild age-related memory loss and cognitive impairment for which she takes Prevagen.  She is fairly independent despite her age lives alone and daughter lives nearby and provides some help which is mostly supervision.  She has had 2 or 3 falls in the last few years most of these are mechanical.  She denies any progressive memory loss or cognitive impairment or incontinence.  Her MRI scan on 08/16/2020 had shown no acute abnormality but generalized atrophy and white matter changes as well as slight ventriculomegaly out of proportion to the atrophy but this appeared unchanged from previous MRI from 07/02/2019.[name]   Update 02/28/2020 JM: Tanya [name] returns for 86-monthfollow-up accompanied by daughter-in-law.   No new or reoccurring stroke/TIA symptoms Denies additional transient speech and AMS episodes Cognition stable without worsening and remains on Prevagen  Reports compliance on aspirin 81 mg daily -denies side effects Blood pressure today 138/78 Recent lipid panel showed LDL 84 (01/13/2020) Remains on B12 for B12 deficiency   Update 08/25/2019 JM: Tanya [name] being seen for 263-monthollow-up accompanied by her daughter in law.   Previously seen by Dr. SeLeonie Manor transient speech and altered awareness episode of unknown etiology.   She has not had additional transient episodes or new neurological symptoms Cognition has been stable with MMSE today 23/30 Remains on Prevagen tolerating well Further extensive work-up including EEG, MRI, MRA and lab work completed.   Dementia panel did show B12 deficiency at 1837advised to follow-up with PCP MR brain w wo contrast showed ventriculomegaly but felt likely chronic with history of meningitis All other work-up unremarkable No concerns at this time   Initial consult visit 06/21/2019 Dr. SeLeonie ManMs. [name] a pleasant [age]ear old Caucasian lady seen today for initial office  consultation visit. She is accompanied by her daughter-in-law. History is obtained from them and review of referral notes. I have personally reviewed imaging studies in PACS and electronic medical records. Patient states that on 05/05/2019 she and her granddaughter cleaned out some closets in her house and after that she felt tired and wanted to take a nap in a chair. Her other granddaughter called on the phone and when she was given the phone to speak she had trouble speaking and was talking gibberish and garbled speech. She was also not able to comprehend what granddaughter and daughter-in-law spoke to her. Her symptoms are improving gradually took about 30 minutes. She did not go to the hospital but felt okay for the rest of the day. She was a little slow in thinking for a little while but that improved. She subsequently saw her primary care physician who ordered a CT scan of the head which was done on 05/20/2019 which shows enlarged ventricular size and some periventricular lucencies. 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 There is no acute abnormality. Carotid ultrasound on the same day shows bilateral carotid bifurcation plaques with less than 50% stenosis. Patient denies any known prior history of strokes, TIAs, seizures significant head injury with loss of consciousness. She however does remember his childhood illness possibly diphtheria which she was quite sick and had altered mental status for several days. [place] physician had to go to another city to get her some medications presumably antibiotics which worked and she gradually got better. She did not have any brain imaging at that time. The patient states she has been having some mild short-term memory difficulties over the years which may be progressing slightly. She does have some sundowning and gets irritated very easily and the daughter-in-law state that she tries to speak to her and get work  done mostly in the morning part of the day. Patient denies any symptoms of gait difficulties gait ataxia falls or injuries. She walks quite well with good balance independently and very rarely needs a cane. She is independent and active daily living and recently has stopped driving. She denies any bladder urgency, frequency or incontinence. She has been referred to see neurosurgeon to discuss abnormal CT scan findings and normal pressure hydrocephalus     ROS:   14 system review of systems is positive for memory loss and all other systems negative  PMH:  Past Medical History:  Diagnosis Date   Hearing loss    Hypothyroidism    Neutropenia ([place])    Osteopenia    Primary osteoarthritis    Tremor     Social History:  Social History   Socioeconomic History   Marital status: Widowed    Spouse name: Not on file   Number of children: Not on file   Years of education: Not on file   Highest education level: Not on file  Occupational History   Not on file  Tobacco Use   Smoking status: Never   Smokeless tobacco: Never  Substance and Sexual Activity   Alcohol use: Yes    Alcohol/week: 1.0 standard drink of alcohol    Types: 1 Glasses of wine per week    Comment: bourbon per day one cup prn   Drug use: Not Currently   Sexual activity: Not Currently  Other Topics Concern   Not on file  Social History Narrative   Not on file   Social Determinants of Health   Financial Resource Strain: Not on file  Food Insecurity: Not on file  Transportation Needs: Not on file  Physical Activity: Not on file  Stress: Not on file  Social Connections: Not on file  Intimate Partner Violence: Not on file    Medications:   Current Outpatient Medications on File Prior to Visit  Medication Sig Dispense Refill   Apoaequorin (PREVAGEN) 10 MG CAPS Take 1 capsule by mouth every morning. 1 capsule 1   Cholecalciferol (VITAMIN D3) 50 MCG (2000 UT) TABS Take 2,000 Units by mouth daily.      clopidogrel (PLAVIX) 75 MG tablet Take 1 tablet (75 mg total) by mouth daily. 30 tablet 0   ferrous sulfate 325 (65 FE) MG tablet Take 1 tablet (325 mg total) by mouth in the morning and at bedtime. 60 tablet 0   levothyroxine (SYNTHROID) 125 MCG tablet Take 1 tablet (125 mcg total) by mouth daily before breakfast. 30 tablet 0   magnesium oxide (MAG-OX) 400 MG tablet Take 1 tablet (400 mg total) by mouth daily. 30 tablet 0   NON FORMULARY Diet:NAS     vitamin B-12 (CYANOCOBALAMIN) 100 MCG tablet Take 100 mcg by mouth daily.     vitamin C (ASCORBIC ACID) 500 MG tablet Take 1 tablet (500 mg total) by mouth 2 (two) times daily. 60 tablet 0   amLODipine (NORVASC) 2.5 MG  tablet Take 1 tablet (2.5 mg total) by mouth every evening. (Patient not taking: Reported on 07/16/2021) 30 tablet 0   No current facility-administered medications on file prior to visit.    Allergies:   Allergies  Allergen Reactions   Codeine Other (See Comments)    Extreme headaches   Shellfish Allergy Hives   Shellfish-Derived Products Hives   Amoxicillin Itching and Rash    Today's Vitals   07/16/21 0932  BP: (!) 161/84  Pulse: 87   There is no height or weight on file to calculate BMI.   Physical Exam General: Frail very pleasant elderly Caucasian lady, seated, in no evident distress Head: head normocephalic and atraumatic.   Neck: supple with no carotid or supraclavicular bruits Cardiovascular: regular rate and rhythm, no murmurs Musculoskeletal: no deformity Skin:  no rash few scattered petichiae Vascular:  Normal pulses all extremities  Neurologic Exam Mental Status: Awake and fully alert.  Fluent speech and language.  Oriented to place and time. Recent and remote memory diminished. Attention span, concentration and fund of knowledge diminished mood and affect appropriate.     07/16/2021    9:47 AM 08/25/2019    2:42 PM  MMSE - Mini Mental State Exam  Orientation to time 1 3  Orientation to Place 2 4   Registration 3 2  Attention/ Calculation 0 5  Recall 3 1  Language- name 2 objects 2 2  Language- repeat 1 1  Language- follow 3 step command 3 3  Language- read & follow direction 1 1  Write a sentence 1 1  Copy design 0 0  Total score 17 23   Cranial Nerves: Pupils equal, briskly reactive to light. Extraocular movements full without nystagmus. Visual fields full to confrontation. Hearing diminished bilaterally despite hearing aid. Facial sensation intact. Face, tongue, palate moves normally and symmetrically.  Motor: Normal bulk and tone. Normal strength in all tested extremity muscles. Sensory.: intact to touch , pinprick , position and vibratory sensation.  Coordination: Rapid alternating movements normal in all extremities. Finger-to-nose and heel-to-shin performed accurately bilaterally. Gait and Station: Arises from chair without difficulty. Stance is normal. Gait demonstrates normal balance mild favoring of right leg. She uses a rollator walker. Reflexes: 1+ and symmetric. Toes downgoing.      ASSESSMENT/PLAN: 88 year old pleasant Caucasian lady with transient episode of speech and language difficulties with altered awareness in 04/2019 of unclear etiology possibilities include left hemispheric TIA versus complex partial seizure and transient episode of slurred speech and right facial droop in 08/2020. Vascular risk factors of hypertension, intra and extracranial stenosis and age.  She also has mild cognitive impairment.  Her brain scans have shown ventriculomegaly out of proportion to the degree of atrophy but her clinical presentation is not compatible with normal pressure hydrocephalus and prominent ventricles have been documented in her prior imaging studies going as far back as MRI in 2002.     History of stroke/TIA -Continue Plavix '75mg'$  daily for secondary stroke -maintain aggressive risk factor modification with strict control of hypertension with blood pressure goal below  130/90, lipids with LDL cholesterol goal below 70 mg percent and maintain a healthy diet and be active and exercise regularly.    Mild cognitive impairment -gradual decline likely in setting of progression -discussed trial of Namenda - daughter and patient will further consider and call if interested  -continue Pravagen  -f/u with PCP next week for repeat lab work including B12 with hx of B12 deficiency as well discussion about  possible underlying depression -Discussed importance of increasing participation with memory exercises and physical exercises    Follow up in 6 months or call earlier if needed    CC:  [name], MD    I spent 38 minutes of face-to-face and non-face-to-face time with patient and DIL.  This included previsit chart review, lab review, study review, electronic health record documentation, patient and DIL education regarding prior hx stroke/TIA history, cognitive impairment with gradual decline, importance of managing stroke risk factors and answered all other questions to patient and daughter in laws [place], [facility]  [facility] [address] [place], [place] [address]  Phone [phone] Fax [phone] Note: This document was prepared with digital dictation and possible smart phrase technology. Any transcriptional errors that result from this process are unintentional.

## 2021-07-16 NOTE — Patient Instructions (Signed)
Please ensure you follow up with your PCP Dr. Shelia Media with history of B12 deficiency and need of routine monitoring as low levels can contribute to your memory loss  Would highly recommend working on memory exercises which can help slow memory decline  Continue clopidogrel 75 mg daily  and atorvastatin  for secondary stroke prevention  Continue to follow up with PCP regarding cholesterol and blood pressure management  Maintain strict control of hypertension with blood pressure goal below 130/90 and cholesterol with LDL cholesterol (bad cholesterol) goal below 70 mg/dL.   Signs of a Stroke? Follow the BEFAST method:  Balance Watch for a sudden loss of balance, trouble with coordination or vertigo Eyes Is there a sudden loss of vision in one or both eyes? Or double vision?  Face: Ask the person to smile. Does one side of the face droop or is it numb?  Arms: Ask the person to raise both arms. Does one arm drift downward? Is there weakness or numbness of a leg? Speech: Ask the person to repeat a simple phrase. Does the speech sound slurred/strange? Is the person confused ? Time: If you observe any of these signs, call 911.     Followup in the future with me in 6 months or call earlier if needed       Thank you for coming to see [place] at [facility]. I hope we have been able to provide you high quality care today.  You may receive a patient satisfaction survey over the next few weeks. We would appreciate your feedback and comments so that we may continue to improve ourselves and the health of our patients.     Memantine Tablets What is this medication? MEMANTINE (MEM an teen) treats memory loss and confusion (dementia) in people who have Alzheimer disease. It works by improving attention, memory, and the ability to engage in daily activities. It is not a cure for dementia or Alzheimer disease. This medicine may be used for other purposes; ask your health care provider or  pharmacist if you have questions. COMMON BRAND NAME(S): Namenda What should I tell my care team before I take this medication? They need to know if you have any of these conditions: Kidney disease Liver disease Seizures Trouble passing urine An unusual or allergic reaction to memantine, other medications, foods, dyes, or preservatives Pregnant or trying to get pregnant Breast-feeding How should I use this medication? Take this medication by mouth with water. Follow the directions on the prescription label. You may take this medication with or without food. Take your doses at regular intervals. Do not take your medication more often than directed. Continue to take your medication even if you feel better. Do not stop taking except on the advice of your care team. Talk to your care team about the use of this medication in children. Special care may be needed Overdosage: If you think you have taken too much of this medicine contact a poison control center or emergency room at once. NOTE: This medicine is only for you. Do not share this medicine with others. What if I miss a dose? If you miss a dose, take it as soon as you can. If it is almost time for your next dose, take only that dose. Do not take double or extra doses. If you do not take your medication for several days, contact your care team. Your dose may need to be changed. What may interact with this medication? Acetazolamide Amantadine Cimetidine Dextromethorphan Dofetilide Hydrochlorothiazide  Ketamine Metformin Methazolamide Quinidine Ranitidine Sodium bicarbonate Triamterene This list may not describe all possible interactions. Give your health care provider a list of all the medicines, herbs, non-prescription drugs, or dietary supplements you use. Also tell them if you smoke, drink alcohol, or use illegal drugs. Some items may interact with your medicine. What should I watch for while using this medication? Visit your care  team for regular checks on your progress. Check with your care team if there is no improvement in your symptoms or if they get worse. This medication may affect your coordination, reaction time, or judgment. Do not drive or operate machinery until you know how this medication affects you. Sit up or stand slowly to reduce the risk of dizzy or fainting spells. Drinking alcohol with this medication can increase the risk of these side effects. What side effects may I notice from receiving this medication? Side effects that you should report to your care team as soon as possible: Allergic reactions--skin rash, itching, hives, swelling of the face, lips, tongue, or throat Side effects that usually do not require medical attention (report to your care team if they continue or are bothersome): Confusion Constipation Diarrhea Dizziness Headache This list may not describe all possible side effects. Call your doctor for medical advice about side effects. You may report side effects to FDA at 1-800-FDA-1088. Where should I keep my medication? Keep out of the reach of children. Store at room temperature between 15 degrees and 30 degrees C (59 degrees and 86 degrees F). Throw away any unused medication after the expiration date. NOTE: This sheet is a summary. It may not cover all possible information. If you have questions about this medicine, talk to your doctor, pharmacist, or health care provider.  2023 Elsevier/Gold Standard (2021-01-16 00:00:00)

## 2021-07-24 DIAGNOSIS — E039 Hypothyroidism, unspecified: Secondary | ICD-10-CM | POA: Diagnosis not present

## 2021-07-24 DIAGNOSIS — E538 Deficiency of other specified B group vitamins: Secondary | ICD-10-CM | POA: Diagnosis not present

## 2021-07-24 DIAGNOSIS — I10 Essential (primary) hypertension: Secondary | ICD-10-CM | POA: Diagnosis not present

## 2021-07-24 DIAGNOSIS — D508 Other iron deficiency anemias: Secondary | ICD-10-CM | POA: Diagnosis not present

## 2021-07-31 DIAGNOSIS — E039 Hypothyroidism, unspecified: Secondary | ICD-10-CM | POA: Diagnosis not present

## 2021-07-31 DIAGNOSIS — R4189 Other symptoms and signs involving cognitive functions and awareness: Secondary | ICD-10-CM | POA: Diagnosis not present

## 2021-07-31 DIAGNOSIS — Z7901 Long term (current) use of anticoagulants: Secondary | ICD-10-CM | POA: Diagnosis not present

## 2021-07-31 DIAGNOSIS — D508 Other iron deficiency anemias: Secondary | ICD-10-CM | POA: Diagnosis not present

## 2021-07-31 DIAGNOSIS — I10 Essential (primary) hypertension: Secondary | ICD-10-CM | POA: Diagnosis not present

## 2021-07-31 DIAGNOSIS — G459 Transient cerebral ischemic attack, unspecified: Secondary | ICD-10-CM | POA: Diagnosis not present

## 2021-08-01 DIAGNOSIS — R4189 Other symptoms and signs involving cognitive functions and awareness: Secondary | ICD-10-CM | POA: Diagnosis not present

## 2021-08-08 DIAGNOSIS — R2689 Other abnormalities of gait and mobility: Secondary | ICD-10-CM | POA: Diagnosis not present

## 2021-08-08 DIAGNOSIS — M6281 Muscle weakness (generalized): Secondary | ICD-10-CM | POA: Diagnosis not present

## 2021-08-26 IMAGING — DX DG HIP (WITH OR WITHOUT PELVIS) 2-3V*R*
2 series · 3 of 3 positions shown · non-contrast
Comparison: None.

CLINICAL DATA: Acute RIGHT hip pain following fall.

EXAM:
DG HIP (WITH OR WITHOUT PELVIS) 2-3V RIGHT

[Series 1: pelvis · 0.14mm/px · 2 of 2 slices shown]
[im 1/2]
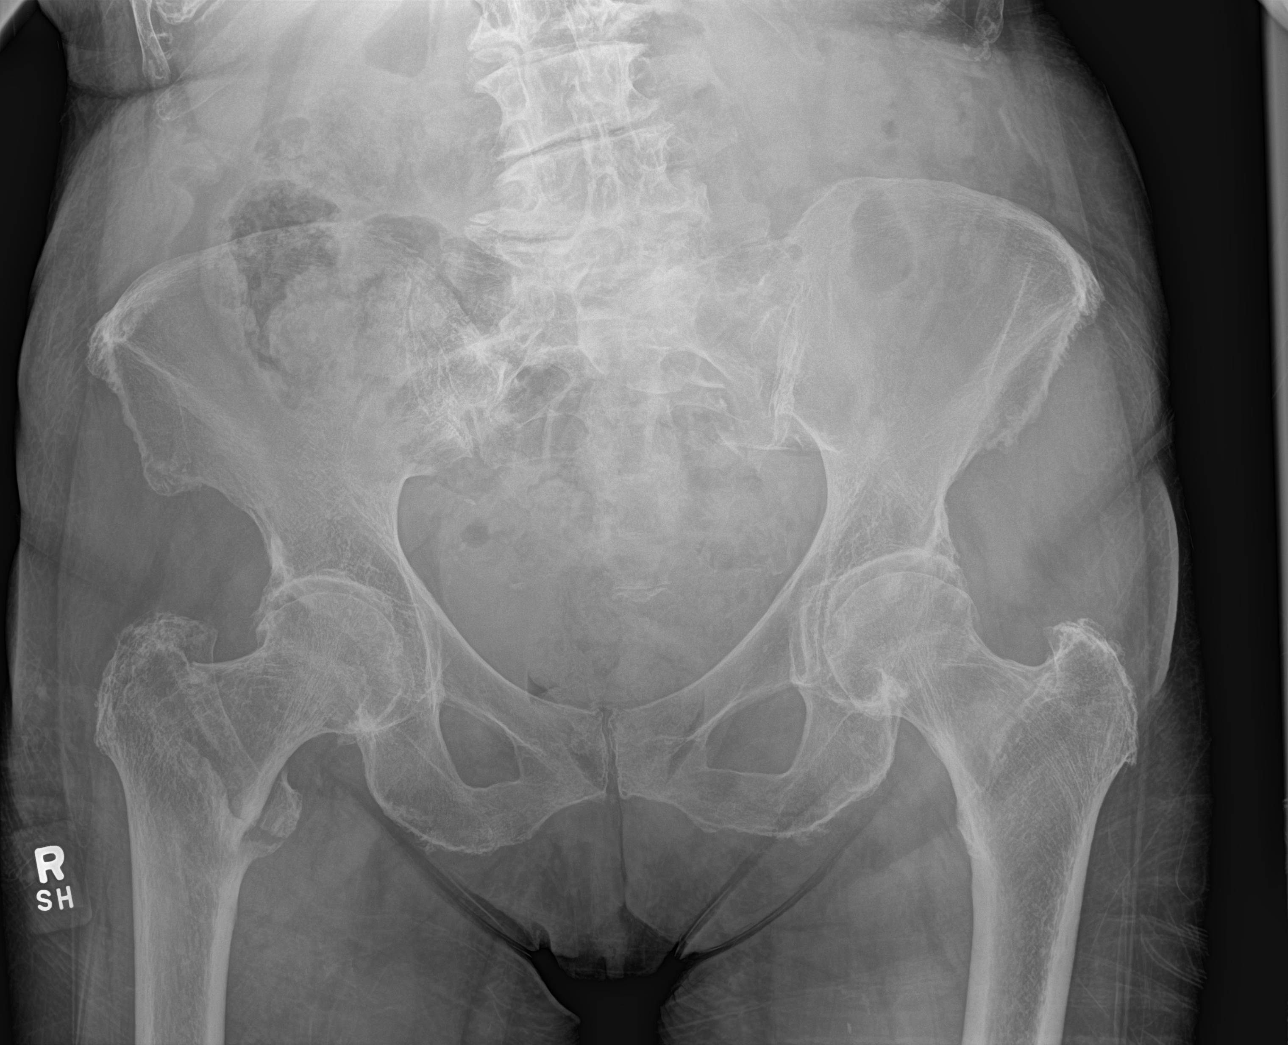
[im 2/2]
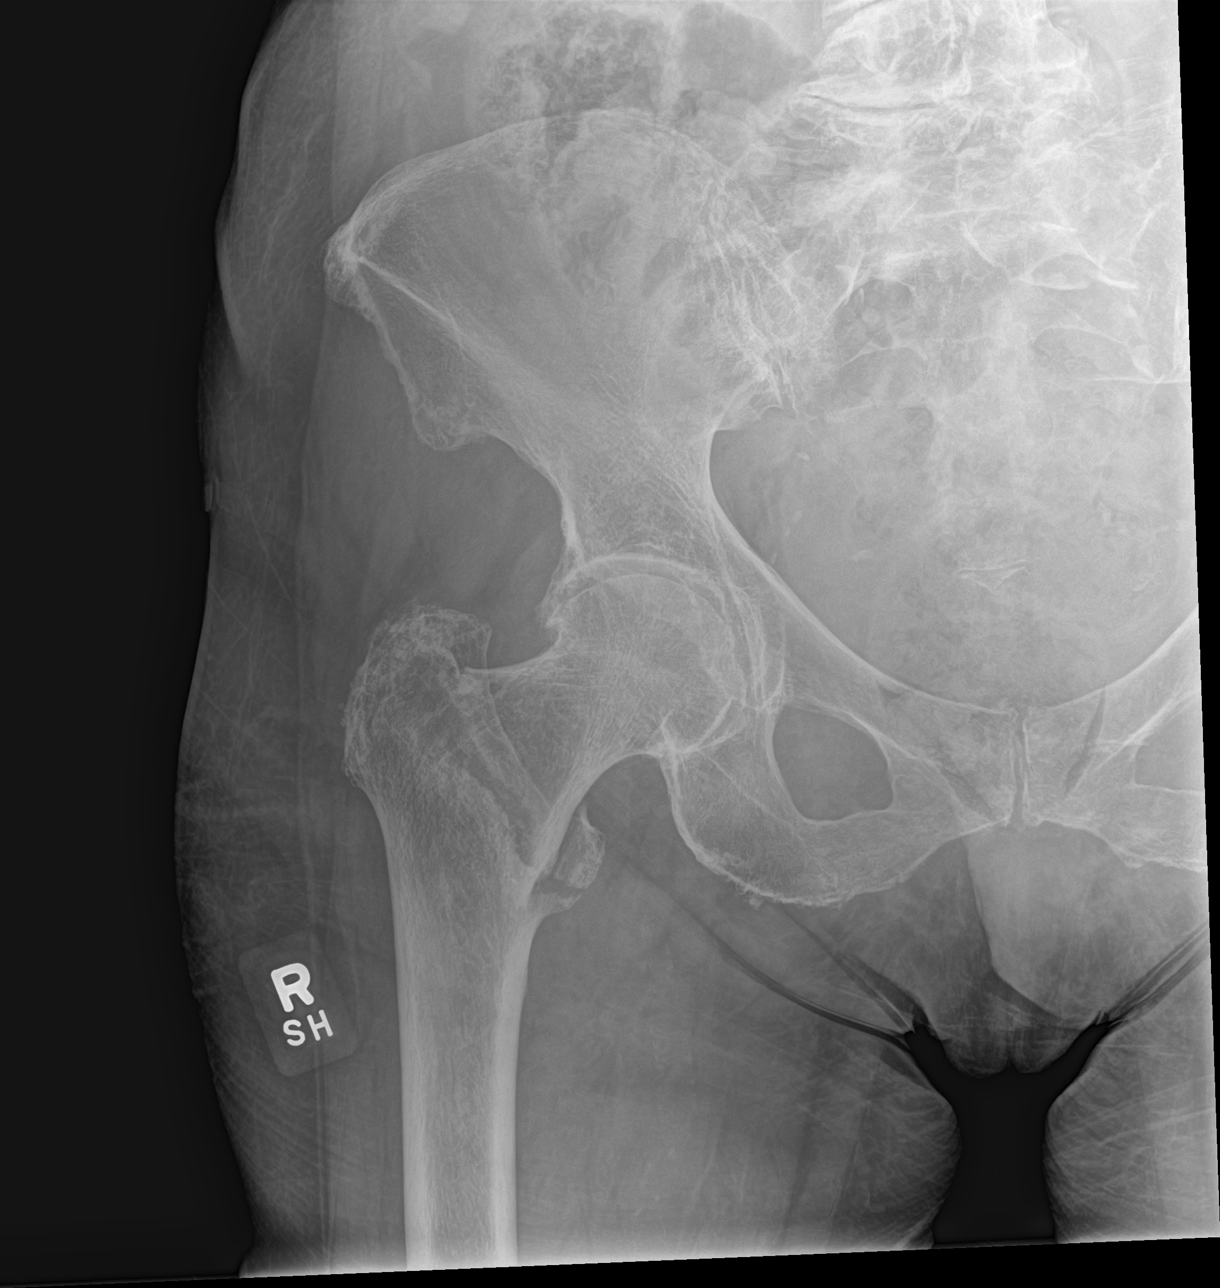

[hip]
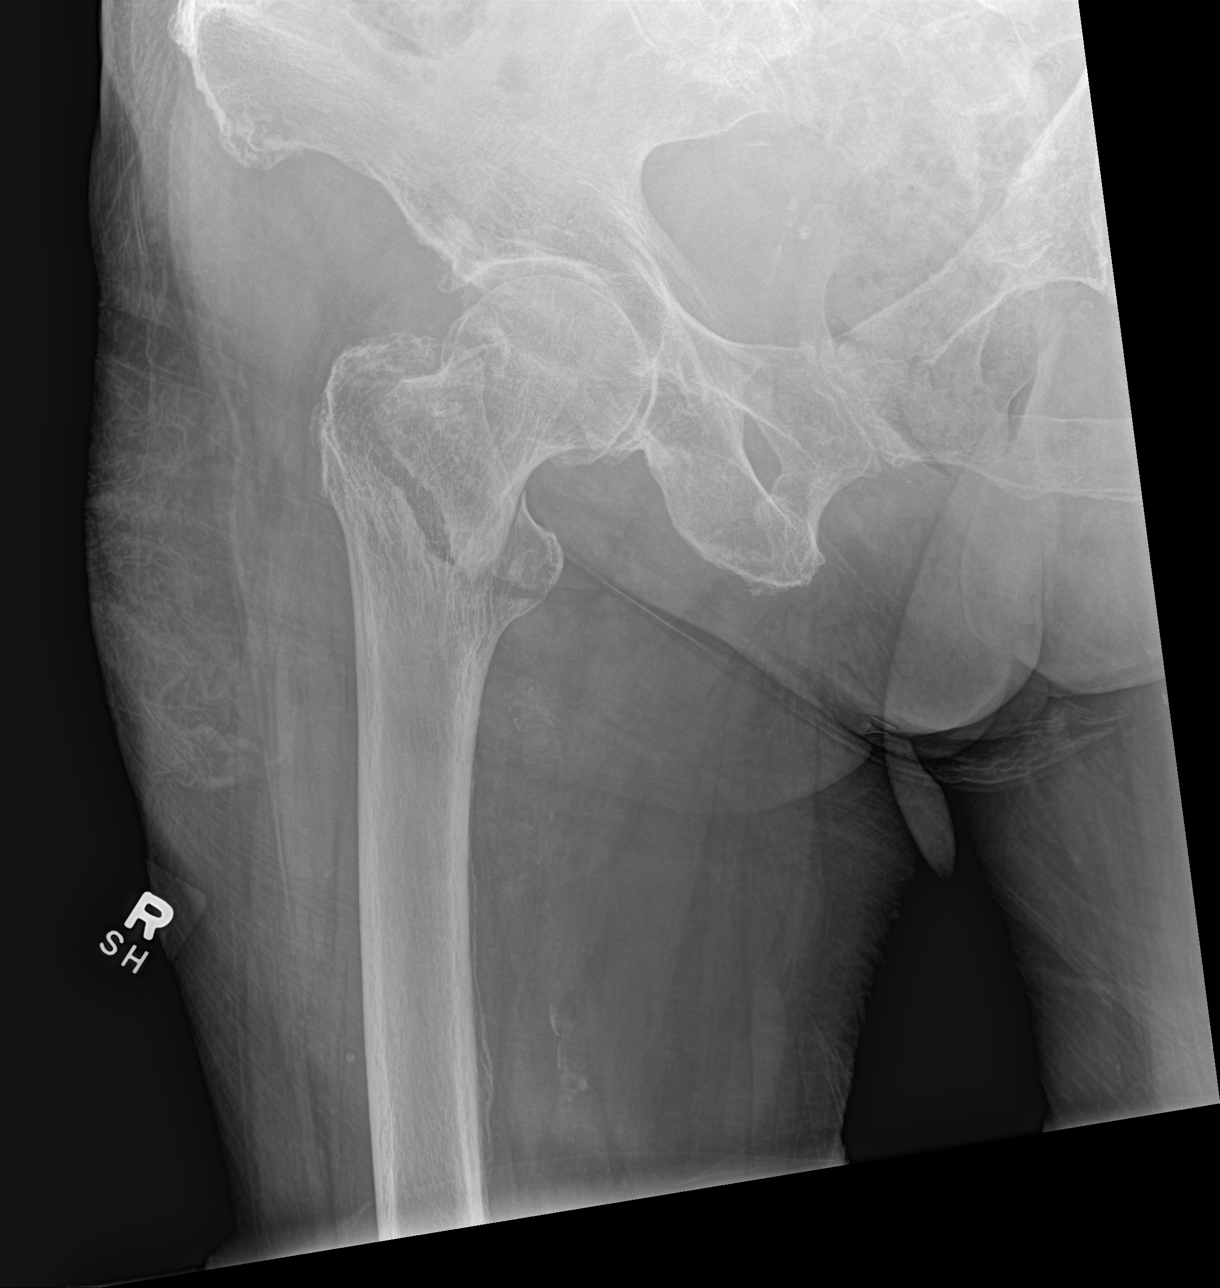

[3 of 3 positions shown; findings below may reference images not displayed]

FINDINGS: An intertrochanteric fracture of the proximal RIGHT femur is
identified with slight varus angulation.

No dislocation noted.

Degenerative changes in the hips and LOWER lumbar spine are present.
IMPRESSION: Intertrochanteric RIGHT femur fracture with slight varus angulation.

## 2021-08-27 IMAGING — RF DG HIP (WITH PELVIS) OPERATIVE*R*
1 series · 3 of 3 positions shown · non-contrast
Comparison: 08/12/2020.

CLINICAL DATA: Surgery, elective ZNO.3 (1QA-T0-CM)

EXAM:
OPERATIVE RIGHT HIP (WITH PELVIS IF PERFORMED) 3 VIEWS
TECHNIQUE: Fluoroscopic spot image(s) were submitted for interpretation
post-operatively.

[Series 1: unknown protocol · 0.14mm/px · 3 of 3 slices shown]
[im 1/3]
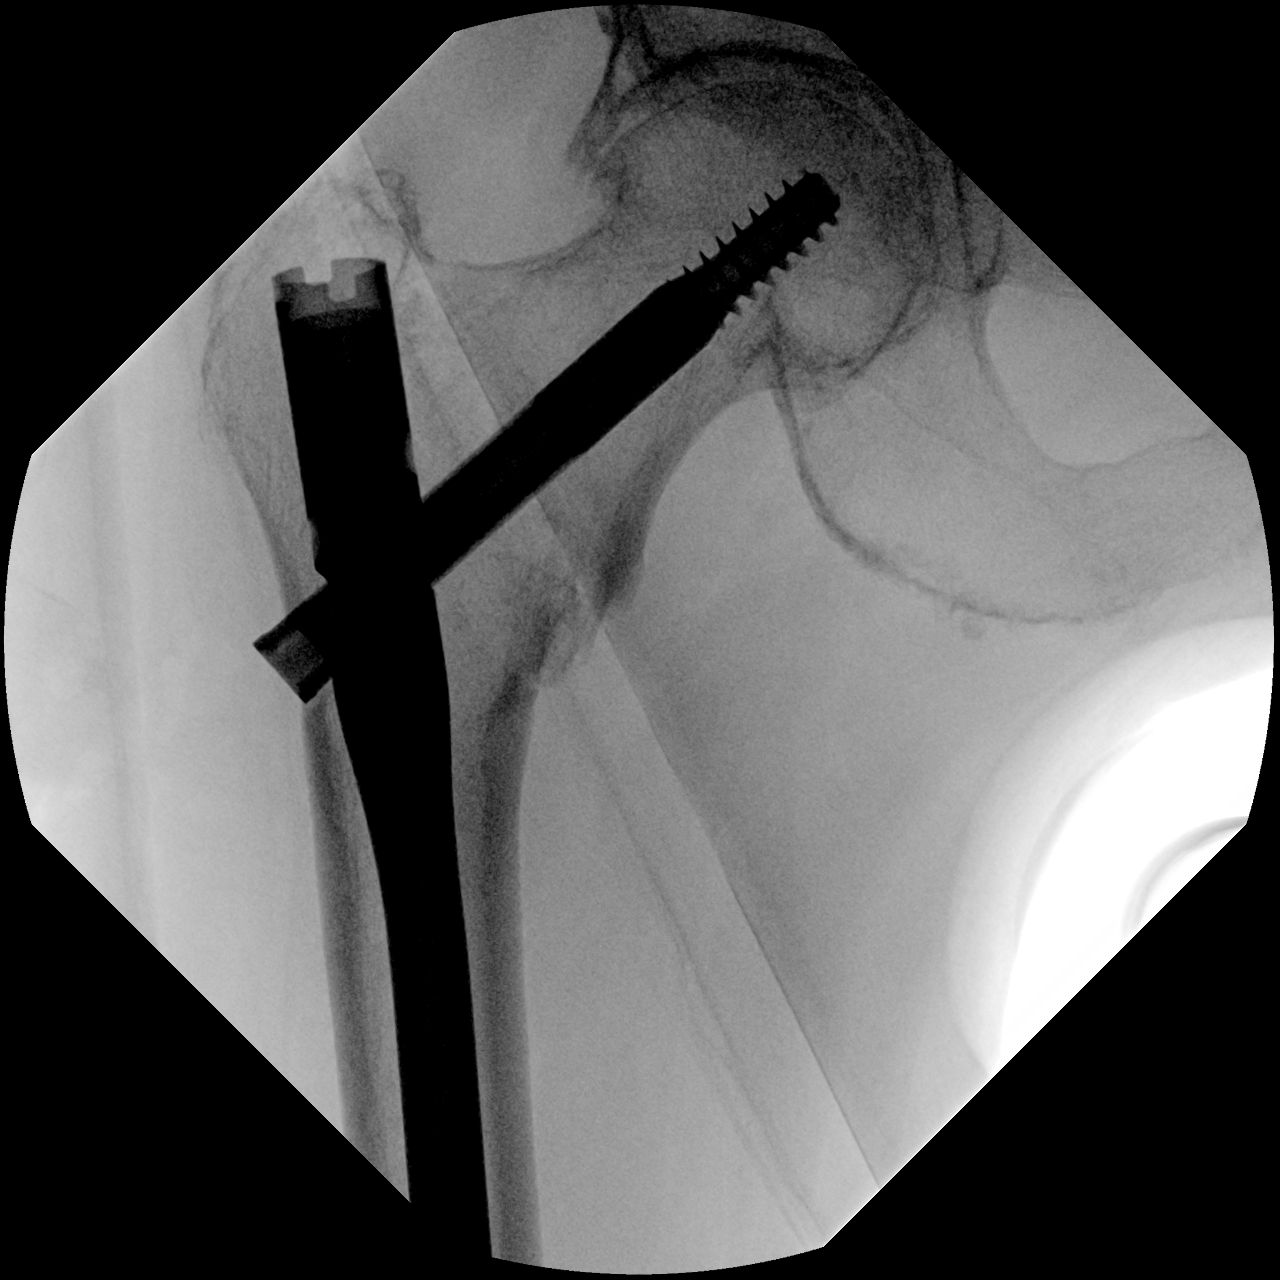
[im 2/3]
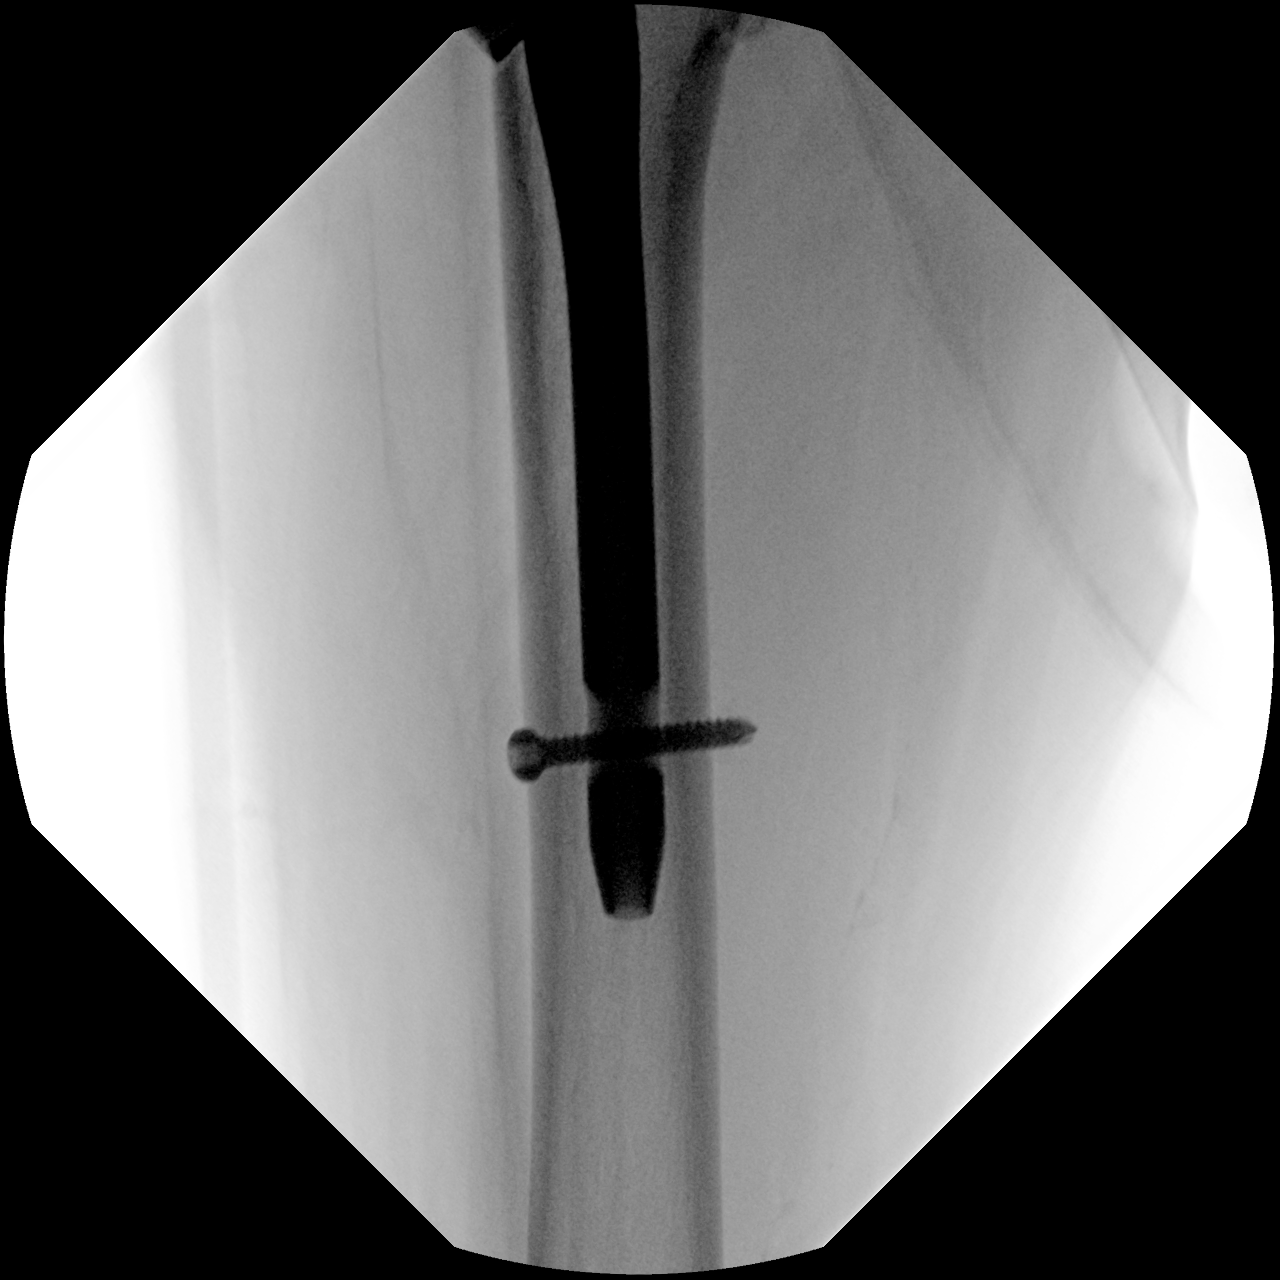
[im 3/3]
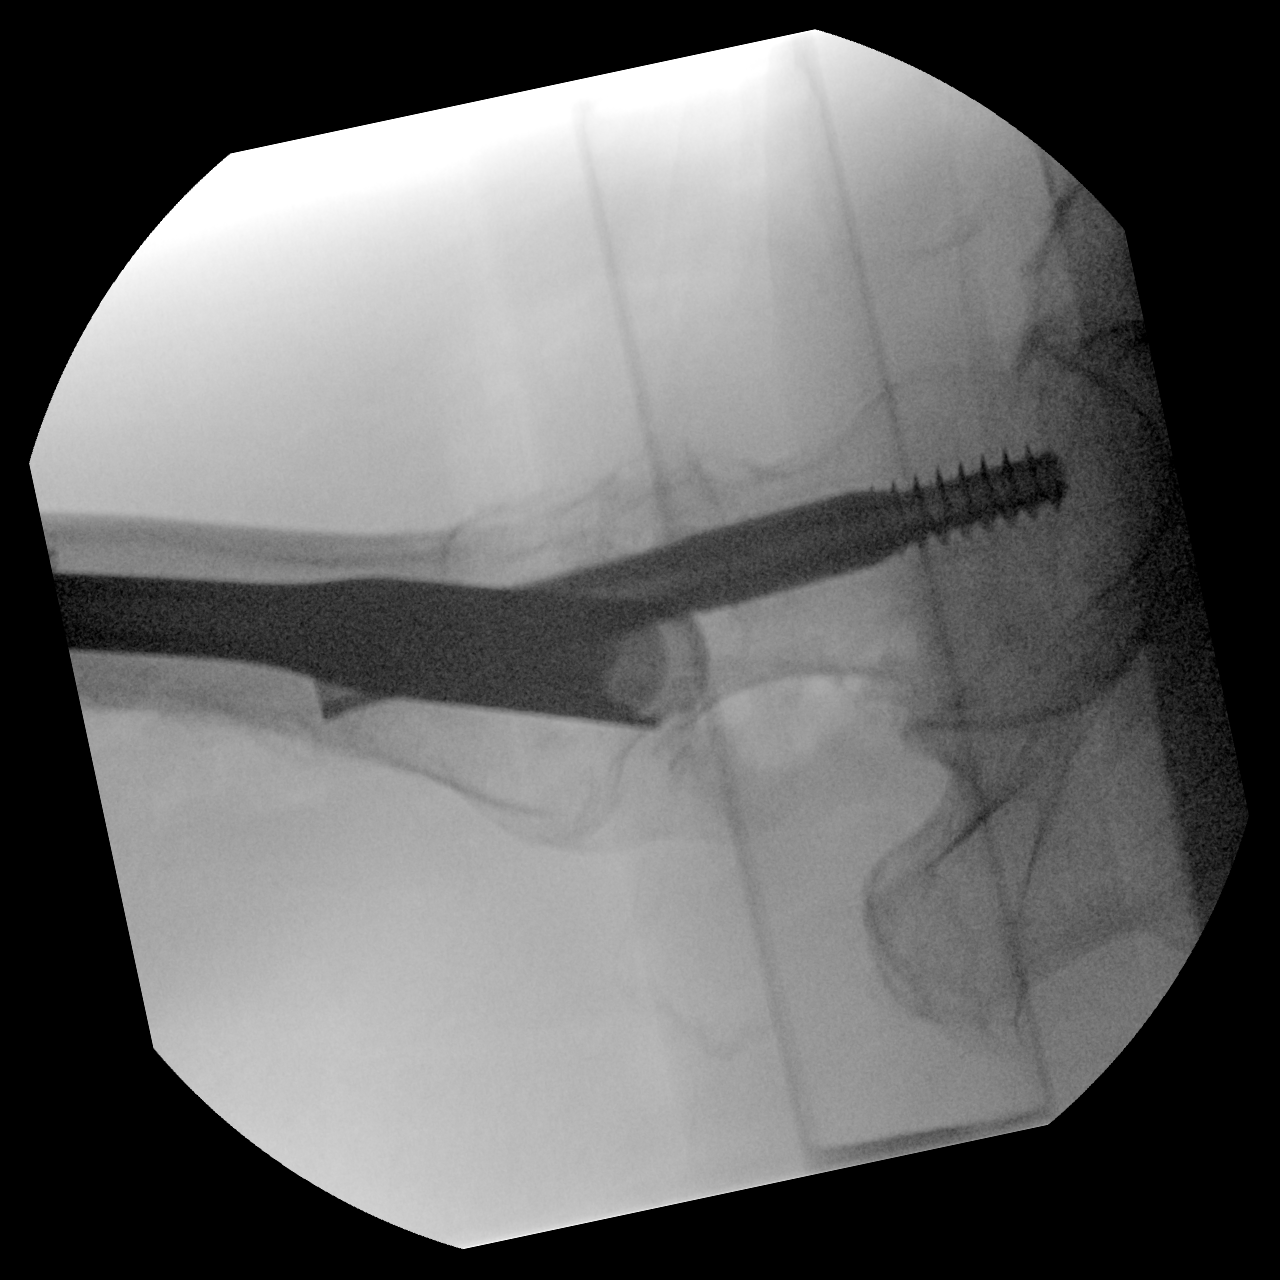

[3 of 3 positions shown; findings below may reference images not displayed]

FINDINGS: Fluoro time: 26 seconds

Reported radiation dose: 4.9 mGy

Three C-arm fluoroscopic images were obtained intraoperatively and
submitted for post operative interpretation. These images
demonstrate intramedullary nail and screw fixation of an
intertrochanteric right femur fracture with near anatomic alignment.
Please see the performing provider's procedural report for further
detail.
IMPRESSION: Intraoperative fluoroscopy, as detailed above.

## 2021-08-31 IMAGING — DX DG ABDOMEN 1V
1 series · 1 of 1 positions shown · non-contrast
Comparison: None.

CLINICAL DATA: Nausea and vomiting.

EXAM:
ABDOMEN - 1 VIEW

[abdomen kub]
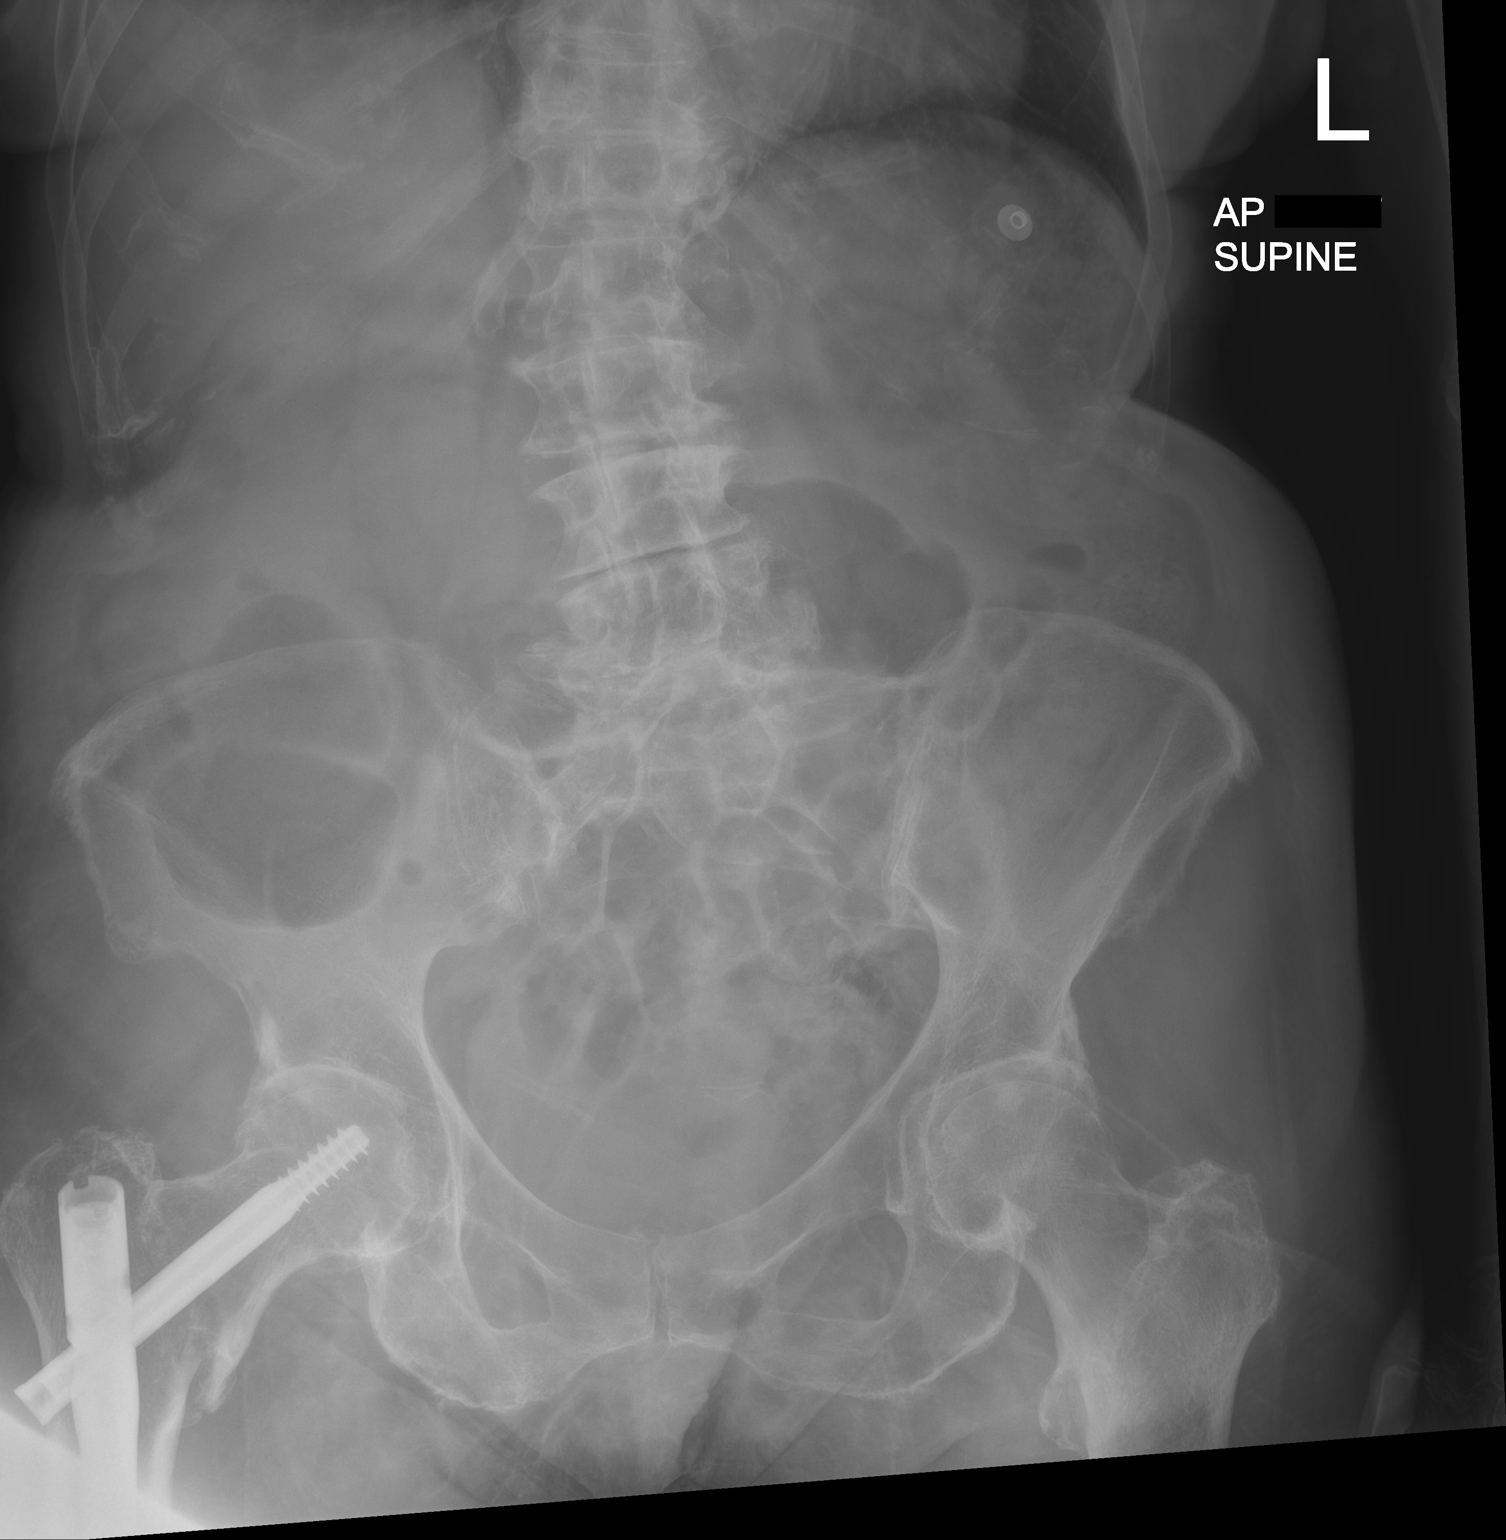

[1 of 1 positions shown; findings below may reference images not displayed]

FINDINGS: Bowel gas pattern is within normal limits. There does not appear to
be an abnormal amount of fecal matter. No abnormal soft tissue
calcifications other than atherosclerotic vascular calcification.
Previous ORIF for basicervical right femoral neck fracture.
Curvature and chronic degenerative change of the spine.
IMPRESSION: Gas pattern within normal limits. There does not appear to be an
abnormal amount of fecal matter.

## 2021-09-08 DIAGNOSIS — M6281 Muscle weakness (generalized): Secondary | ICD-10-CM | POA: Diagnosis not present

## 2021-09-08 DIAGNOSIS — R2689 Other abnormalities of gait and mobility: Secondary | ICD-10-CM | POA: Diagnosis not present

## 2021-11-08 DIAGNOSIS — H02133 Senile ectropion of right eye, unspecified eyelid: Secondary | ICD-10-CM | POA: Diagnosis not present

## 2021-11-08 DIAGNOSIS — H04123 Dry eye syndrome of bilateral lacrimal glands: Secondary | ICD-10-CM | POA: Diagnosis not present

## 2021-11-08 DIAGNOSIS — Z961 Presence of intraocular lens: Secondary | ICD-10-CM | POA: Diagnosis not present

## 2021-11-08 DIAGNOSIS — H353132 Nonexudative age-related macular degeneration, bilateral, intermediate dry stage: Secondary | ICD-10-CM | POA: Diagnosis not present

## 2021-11-08 DIAGNOSIS — H524 Presbyopia: Secondary | ICD-10-CM | POA: Diagnosis not present

## 2021-11-08 DIAGNOSIS — H52223 Regular astigmatism, bilateral: Secondary | ICD-10-CM | POA: Diagnosis not present

## 2021-12-06 DIAGNOSIS — H04123 Dry eye syndrome of bilateral lacrimal glands: Secondary | ICD-10-CM | POA: Diagnosis not present

## 2021-12-06 DIAGNOSIS — H353132 Nonexudative age-related macular degeneration, bilateral, intermediate dry stage: Secondary | ICD-10-CM | POA: Diagnosis not present

## 2021-12-06 DIAGNOSIS — H35352 Cystoid macular degeneration, left eye: Secondary | ICD-10-CM | POA: Diagnosis not present

## 2021-12-06 DIAGNOSIS — H40023 Open angle with borderline findings, high risk, bilateral: Secondary | ICD-10-CM | POA: Diagnosis not present

## 2021-12-06 DIAGNOSIS — H02133 Senile ectropion of right eye, unspecified eyelid: Secondary | ICD-10-CM | POA: Diagnosis not present

## 2021-12-06 DIAGNOSIS — H5203 Hypermetropia, bilateral: Secondary | ICD-10-CM | POA: Diagnosis not present

## 2021-12-06 DIAGNOSIS — H524 Presbyopia: Secondary | ICD-10-CM | POA: Diagnosis not present

## 2021-12-06 DIAGNOSIS — H52223 Regular astigmatism, bilateral: Secondary | ICD-10-CM | POA: Diagnosis not present

## 2021-12-06 DIAGNOSIS — Z961 Presence of intraocular lens: Secondary | ICD-10-CM | POA: Diagnosis not present

## 2021-12-21 DIAGNOSIS — H353112 Nonexudative age-related macular degeneration, right eye, intermediate dry stage: Secondary | ICD-10-CM | POA: Diagnosis not present

## 2021-12-21 DIAGNOSIS — H353221 Exudative age-related macular degeneration, left eye, with active choroidal neovascularization: Secondary | ICD-10-CM | POA: Diagnosis not present

## 2021-12-21 DIAGNOSIS — H43811 Vitreous degeneration, right eye: Secondary | ICD-10-CM | POA: Diagnosis not present

## 2022-01-16 ENCOUNTER — Ambulatory Visit: Payer: PPO | Admitting: Adult Health

## 2022-01-21 DIAGNOSIS — I10 Essential (primary) hypertension: Secondary | ICD-10-CM | POA: Diagnosis not present

## 2022-01-21 DIAGNOSIS — E559 Vitamin D deficiency, unspecified: Secondary | ICD-10-CM | POA: Diagnosis not present

## 2022-01-21 DIAGNOSIS — E039 Hypothyroidism, unspecified: Secondary | ICD-10-CM | POA: Diagnosis not present

## 2022-01-21 DIAGNOSIS — E538 Deficiency of other specified B group vitamins: Secondary | ICD-10-CM | POA: Diagnosis not present

## 2022-01-21 DIAGNOSIS — D508 Other iron deficiency anemias: Secondary | ICD-10-CM | POA: Diagnosis not present

## 2022-01-23 ENCOUNTER — Encounter: Payer: Self-pay | Admitting: Adult Health

## 2022-01-23 DIAGNOSIS — E559 Vitamin D deficiency, unspecified: Secondary | ICD-10-CM | POA: Diagnosis not present

## 2022-01-23 DIAGNOSIS — Z23 Encounter for immunization: Secondary | ICD-10-CM | POA: Diagnosis not present

## 2022-01-23 DIAGNOSIS — Z8673 Personal history of transient ischemic attack (TIA), and cerebral infarction without residual deficits: Secondary | ICD-10-CM | POA: Diagnosis not present

## 2022-01-23 DIAGNOSIS — Z7901 Long term (current) use of anticoagulants: Secondary | ICD-10-CM | POA: Diagnosis not present

## 2022-01-23 DIAGNOSIS — Z01818 Encounter for other preprocedural examination: Secondary | ICD-10-CM | POA: Diagnosis not present

## 2022-01-23 DIAGNOSIS — Z0000 Encounter for general adult medical examination without abnormal findings: Secondary | ICD-10-CM | POA: Diagnosis not present

## 2022-01-23 DIAGNOSIS — E039 Hypothyroidism, unspecified: Secondary | ICD-10-CM | POA: Diagnosis not present

## 2022-01-24 NOTE — Telephone Encounter (Signed)
Received a clearance report from the patient's oral surgeon to make sure sure from neurology standpoint, pt is clear to have oral surgery.  Will place on [place] desk for review upon return

## 2022-01-28 NOTE — Telephone Encounter (Signed)
Patient has upcoming visit on 1/29. Can discuss clearance and risk of stopping plavix in regards to stroke/TIA history at that visit. Will complete form at that time if appropriate.  Thank you.

## 2022-01-31 NOTE — Progress Notes (Signed)
[facility] [address] [place]. [place] [address] [phone]       OFFICE FOLLOW UP NOTE  [name] Date of Birth:  1925-08-05 Medical Record Number:  [id number]   Referring MD: [name] Reason for Referral: TIA   Chief Complaint  Patient presents with   Follow-up    Patient in room #8n with her daughter in law Tanya [name]. Pt here today to f/u with TIA and sates she has been doing fine.      HPI:   Update 02/04/2022 JM: Patient returns for 87-monthfollow-up accompanied by her daughter-in-law.  Overall stable without new or reoccurring stroke/TIA symptoms.  Feels like cognition has been gradually declining. Lives alone, maintains ADLs independently.  Family assists with IADLs. Has been having more issues with hearing loss, has not yet been seen by audiology.   Compliant on Plavix.  Blood pressure well controlled.  Routinely follows with PCP.   She is requesting clearance to undergo single tooth extraction under local anesthesia in need of holding Plavix currently scheduled 02/11/2022.     History provided for reference purposes only Update 07/16/2021 JM: Patient returns for 87-monthollow-up visit accompanied by her daughter.  Overall stable without new or reoccurring stroke/TIA symptoms or any other transient neurological symptoms. Daughter is concerned regarding gradual memory decline and occasional confusion. Has good days/bad days. Denies any abrupt worsening.  Does continue to live alone, no longer has overnight aide supervision, family lives close by who checks on her frequently and assists with IADLs.  Able to maintain ADLs independently. Some concern of underlying depression. Relatively sedentary with limited physical activity, admits to doing no memory exercises. MMSE today 17/30 (prior 23/30 08/2019). Reports recent lab work with PCP back in May, has f/u visit next week. Compliant on Plavix, denies side effects.  Blood pressure today elevated, has  not been routinely monitoring at home. PCP d/c'd amlodipine back in January.  No further concerns at this time.   Update, 01/11/2020 JM: [name] here today for a stroke/seizure follow-up accompanied by daughter-in-law. Denies new stroke/TIA symptoms. No seizures. No new falls. PT/OT completed, but she does perform exercises occasionally. She continues to use a Rollator walker. She continues to live alone with overnight caregivers - DIL reports "clumsiness", and "slow" with performing some ADLs and requires assistance with IADLs. Remains on Plavix without side effects, Aspirin DC by PCP with burst eye vessel and associated black eye (was not previously stopped as Dr. SeLeonie Manecommended). BP today 132/84. Labs A1c 5.8, LDL 69, and B12 1,201 08/15/2020, has labs scheduled for next week at PCP.  No new concerns at this time  Update 10/11/2020 PS: [name] a pleasant 89ear old Caucasian lady seen today for consultation visit for TIA.  She is accompanied by her daughter.  History is obtained from them and review of electronic medical records and I personally reviewed pertinent imaging films in PACS.[age]ear old female with a PMHx of TIA, vitamine B12 deficiency, mild cognitive impairment, hearing loss, hypothyroidism, osteopenia and tremor who presented to the hospital on 08/12/20 after tripping and falling at home, fracturing her right hip (displaced right intertrochanteric femur fracture). She underwent intramedullary nailing on 8/7 /22 and since then has been able to ambulate. On 08/15/20 morning, she was noted by daughter to suddenly have slurred speech and right facial droop. LKN 11 AM. Code Stroke was called. She was on ASA CT scan of the head showed no acute abnormality with aspect score  of 10.  CT angiogram of the brain and neck showed mild less than 50% bilateral carotid bifurcation stenosis with moderate left vertebral artery origin stenosis and moderate right paraclinoid and mild left paraclinoid  stenosis.  Echocardiogram showed ejection fraction of 50 to 55% without definite cardiac source of embolism.  LDL cholesterol 69 mg percent hemoglobin A1c was 5.8.  Patient symptoms improved by the time she came back from CT scan.  She was started on dual antiplatelet therapy of aspirin and Plavix.  Patient has since done well she has been home and getting home physical Occupational Therapy.  She is able to ambulate with a Rollator.  She can walk short distances by herself.  She has had no recurrent TIA or stroke symptoms. Patient has previously been seen in the office and she had an episode on 05/05/2019 was felt to be complex partial seizure versus a TIA.  She also had mild age-related memory loss and cognitive impairment for which she takes Prevagen.  She is fairly independent despite her age lives alone and daughter lives nearby and provides some help which is mostly supervision.  She has had 2 or 3 falls in the last few years most of these are mechanical.  She denies any progressive memory loss or cognitive impairment or incontinence.  Her MRI scan on 08/16/2020 had shown no acute abnormality but generalized atrophy and white matter changes as well as slight ventriculomegaly out of proportion to the atrophy but this appeared unchanged from previous MRI from 07/02/2019.[name]   Update 02/28/2020 JM: [name] returns for 87-monthfollow-up accompanied by daughter-in-law.   No new or reoccurring stroke/TIA symptoms Denies additional transient speech and AMS episodes Cognition stable without worsening and remains on Prevagen  Reports compliance on aspirin 81 mg daily -denies side effects Blood pressure today 138/78 Recent lipid panel showed LDL 84 (01/13/2020) Remains on B12 for B12 deficiency   Update 08/25/2019 JM: [name] being seen for 87-monthollow-up accompanied by her daughter in law.   Previously seen by Dr. SeLeonie Manor transient speech and altered awareness episode of unknown etiology.   She  has not had additional transient episodes or new neurological symptoms Cognition has been stable with MMSE today 23/30 Remains on Prevagen tolerating well Further extensive work-up including EEG, MRI, MRA and lab work completed.   Dementia panel did show B12 deficiency at 1820advised to follow-up with PCP MR brain w wo contrast showed ventriculomegaly but felt likely chronic with history of meningitis All other work-up unremarkable No concerns at this time   Initial consult visit 06/21/2019 Dr. SeLeonie ManMs. TuMilbergers a pleasant [age]ear old Caucasian lady seen today for initial office consultation visit. She is accompanied by her daughter-in-law. History is obtained from them and review of referral notes. I have personally reviewed imaging studies in PACS and electronic medical records. Patient states that on 05/05/2019 she and her granddaughter cleaned out some closets in her house and after that she felt tired and wanted to take a nap in a chair. Her other granddaughter called on the phone and when she was given the phone to speak she had trouble speaking and was talking gibberish and garbled speech. She was also not able to comprehend what granddaughter and daughter-in-law spoke to her. Her symptoms are improving gradually took about 30 minutes. She did not go to the hospital but felt okay for the rest of the day. She was a little slow in thinking for a little while but that improved.  She subsequently saw her primary care physician who ordered a CT scan of the head which was done on 05/20/2019 which shows enlarged ventricular size and some periventricular lucencies. 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 There is no acute abnormality. Carotid ultrasound on the same day shows bilateral carotid bifurcation plaques with less than 50% stenosis. Patient denies any known prior history of strokes, TIAs, seizures significant head injury with loss of consciousness. She however does remember his childhood  illness possibly diphtheria which she was quite sick and had altered mental status for several days. [place] had to go to another city to get her some medications presumably antibiotics which worked and she gradually got better. She did not have any brain imaging at that time. The patient states she has been having some mild short-term memory difficulties over the years which may be progressing slightly. She does have some sundowning and gets irritated very easily and the daughter-in-law state that she tries to speak to her and get work done mostly in the morning part of the day. Patient denies any symptoms of gait difficulties gait ataxia falls or injuries. She walks quite well with good balance independently and very rarely needs a cane. She is independent and active daily living and recently has stopped driving. She denies any bladder urgency, frequency or incontinence. She has been referred to see neurosurgeon to discuss abnormal CT scan findings and normal pressure hydrocephalus     ROS:   14 system review of systems is positive for memory loss and all other systems negative  PMH:  Past Medical History:  Diagnosis Date   Hearing loss    Hypothyroidism    Neutropenia ([place])    Osteopenia    Primary osteoarthritis    Tremor     Social History:  Social History   Socioeconomic History   Marital status: Widowed    Spouse name: Not on file   Number of children: Not on file   Years of education: Not on file   Highest education level: Not on file  Occupational History   Not on file  Tobacco Use   Smoking status: Never   Smokeless tobacco: Never  Substance and Sexual Activity   Alcohol use: Yes    Alcohol/week: 1.0 standard drink of alcohol    Types: 1 Glasses of wine per week    Comment: bourbon per day one cup prn   Drug use: Not Currently   Sexual activity: Not Currently  Other Topics Concern   Not on file  Social  History Narrative   Not on file   Social Determinants of Health   Financial Resource Strain: Not on file  Food Insecurity: Not on file  Transportation Needs: Not on file  Physical Activity: Not on file  Stress: Not on file  Social Connections: Not on file  Intimate Partner Violence: Not on file    Medications:   Current Outpatient Medications on File Prior to Visit  Medication Sig Dispense Refill   amLODipine (NORVASC) 2.5 MG tablet Take 1 tablet (2.5 mg total) by mouth every evening. 30 tablet 0   Apoaequorin (PREVAGEN) 10 MG CAPS Take 1 capsule by mouth every morning. 1 capsule 1   Cholecalciferol (VITAMIN D3) 50 MCG (2000 UT) TABS Take 2,000 Units by mouth daily.     clopidogrel (PLAVIX) 75 MG tablet Take 1 tablet (75 mg total) by mouth daily. 30 tablet 0   ferrous sulfate  325 (65 FE) MG tablet Take 1 tablet (325 mg total) by mouth in the morning and at bedtime. 60 tablet 0   levothyroxine (SYNTHROID) 112 MCG tablet Take 112 mcg by mouth daily before breakfast.     magnesium oxide (MAG-OX) 400 MG tablet Take 1 tablet (400 mg total) by mouth daily. 30 tablet 0   NON FORMULARY Diet:NAS     vitamin B-12 (CYANOCOBALAMIN) 100 MCG tablet Take 100 mcg by mouth daily.     vitamin C (ASCORBIC ACID) 500 MG tablet Take 1 tablet (500 mg total) by mouth 2 (two) times daily. (Patient not taking: Reported on 02/04/2022) 60 tablet 0   No current facility-administered medications on file prior to visit.    Allergies:   Allergies  Allergen Reactions   Codeine Other (See Comments)    Extreme headaches   Shellfish Allergy Hives   Shellfish-Derived Products Hives   Amoxicillin Itching and Rash    Today's Vitals   02/04/22 1307  BP: 133/63  Pulse: 88  Weight: 116 lb 6.4 oz (52.8 kg)  Height: '5\' 2"'$  (1.575 m)    Body mass index is 21.29 kg/m.   Physical Exam General: Frail very pleasant elderly Caucasian lady, seated, in no evident distress Head: head normocephalic and atraumatic.    Neck: supple with no carotid or supraclavicular bruits Cardiovascular: regular rate and rhythm, no murmurs Musculoskeletal: no deformity Skin:  no rash few scattered petichiae Vascular:  Normal pulses all extremities  Neurologic Exam Mental Status: Awake and fully alert.  Fluent speech and language.  Oriented to place and time. Recent and remote memory diminished. Attention span, concentration and fund of knowledge diminished mood and affect appropriate.     02/04/2022    1:44 PM 07/16/2021    9:47 AM 08/25/2019    2:42 PM  MMSE - Mini Mental State Exam  Orientation to time '3 1 3  '$ Orientation to Place '5 2 4  '$ Registration '3 3 2  '$ Attention/ Calculation 1 0 5  Recall '2 3 1  '$ Language- name 2 objects '2 2 2  '$ Language- repeat '1 1 1  '$ Language- follow 3 step command '3 3 3  '$ Language- read & follow direction '1 1 1  '$ Write a sentence '1 1 1  '$ Copy design 1 0 0  Total score '23 17 23   '$ Cranial Nerves: Pupils equal, briskly reactive to light. Extraocular movements full without nystagmus. Visual fields full to confrontation. Hearing diminished bilaterally despite hearing aid. Facial sensation intact. Face, tongue, palate moves normally and symmetrically.  Motor: Normal bulk and tone. Normal strength in all tested extremity muscles. Sensory.: intact to touch , pinprick , position and vibratory sensation.  Coordination: Rapid alternating movements normal in all extremities. Finger-to-nose and heel-to-shin performed accurately bilaterally. Gait and Station: Arises from chair without difficulty. Stance is normal. Gait demonstrates normal balance mild favoring of right leg. She uses a rollator walker. Reflexes: 1+ and symmetric. Toes downgoing.      ASSESSMENT/PLAN: 89 year old pleasant Caucasian lady with transient episode of speech and language difficulties with altered awareness in 04/2019 of unclear etiology possibilities include left hemispheric TIA versus complex partial seizure and transient  episode of slurred speech and right facial droop in 08/2020. Vascular risk factors of hypertension, intra and extracranial stenosis and age.  She also has mild cognitive impairment.  Her brain scans have shown ventriculomegaly out of proportion to the degree of atrophy but her clinical presentation is not compatible with normal pressure hydrocephalus and prominent  ventricles have been documented in her prior imaging studies going as far back as MRI in 2002.     History of stroke/TIA -Continue Plavix '75mg'$  daily for secondary stroke -maintain aggressive risk factor modification with strict control of hypertension with blood pressure goal below 130/90, lipids with LDL cholesterol goal below 70 mg percent and maintain a healthy diet and be active and exercise regularly.    Mild cognitive impairment -MMSE today 23/30 (prior 17/30) -due to subjective gradual progression, recommend starting Namenda titration with eventual dose goal of '10mg'$  BID -Discussed importance of increasing participation with memory exercises and physical exercises as well as importance of managing vascular risk factors  Surgical clearance -As stable from stroke standpoint without any recurrent stroke/stroke symptoms since 08/2020, patient is cleared to undergo tooth extraction currently scheduled on 02/11/2022, can hold Plavix if needed for 3-5 days prior to surgery with small but acceptable risk of recurrent stroke while off therapy, recommend restarting immediately after once hemodynamically stable. This was discussed with patient and daughter-in-law who verbalized understanding    Follow up in 6 months or call earlier if needed    CC:  [name], MD    I spent 36 minutes of face-to-face and non-face-to-face time with patient and DIL.  This included previsit chart review, lab review, study review, electronic health record documentation, patient and DIL education regarding prior hx stroke/TIA history, cognitive impairment  with gradual decline with review of MMSE, importance of managing stroke risk factors and answered all other questions to patient and daughter in laws [place], [facility]  [facility] [address] [place], [place] [address]  Phone [phone] Fax [phone] Note: This document was prepared with digital dictation and possible smart phrase technology. Any transcriptional errors that result from this process are unintentional.

## 2022-02-01 DIAGNOSIS — H353221 Exudative age-related macular degeneration, left eye, with active choroidal neovascularization: Secondary | ICD-10-CM | POA: Diagnosis not present

## 2022-02-04 ENCOUNTER — Encounter: Payer: Self-pay | Admitting: Adult Health

## 2022-02-04 ENCOUNTER — Ambulatory Visit: Payer: PPO | Admitting: Adult Health

## 2022-02-04 VITALS — BP 133/63 | HR 88 | Ht 62.0 in | Wt 116.4 lb

## 2022-02-04 DIAGNOSIS — Z8673 Personal history of transient ischemic attack (TIA), and cerebral infarction without residual deficits: Secondary | ICD-10-CM | POA: Diagnosis not present

## 2022-02-04 DIAGNOSIS — G3184 Mild cognitive impairment, so stated: Secondary | ICD-10-CM

## 2022-02-04 DIAGNOSIS — G459 Transient cerebral ischemic attack, unspecified: Secondary | ICD-10-CM | POA: Diagnosis not present

## 2022-02-04 DIAGNOSIS — Z01818 Encounter for other preprocedural examination: Secondary | ICD-10-CM | POA: Diagnosis not present

## 2022-02-04 MED ORDER — MEMANTINE HCL 10 MG PO TABS: 10.0000 mg | ORAL_TABLET | Freq: Two times a day (BID) | ORAL | 5 refills | Status: DC

## 2022-02-04 MED ORDER — MEMANTINE HCL 28 X 5 MG & 21 X 10 MG PO TABS: ORAL_TABLET | ORAL | 0 refills | Status: DC

## 2022-02-04 NOTE — Patient Instructions (Addendum)
Recommend trying Namenda to help slow memory decline - will do this in a titration format. Please follow directions on titration pak. End dose will be Namenda '10mg'$  twice daily. If any difficulty tolerating while gradually increasing dosage, please let me know.   Continue clopidogrel 75 mg daily for secondary stroke prevention  Continue to follow up with PCP regarding blood pressure and cholesterol management  Maintain strict control of hypertension with blood pressure goal below 130/90 and cholesterol with LDL cholesterol (bad cholesterol) goal below 70 mg/dL.   Signs of a Stroke? Follow the BEFAST method:  Balance Watch for a sudden loss of balance, trouble with coordination or vertigo Eyes Is there a sudden loss of vision in one or both eyes? Or double vision?  Face: Ask the person to smile. Does one side of the face droop or is it numb?  Arms: Ask the person to raise both arms. Does one arm drift downward? Is there weakness or numbness of a leg? Speech: Ask the person to repeat a simple phrase. Does the speech sound slurred/strange? Is the person confused ? Time: If you observe any of these signs, call 911.     Followup in the future with me in 6 months or call earlier if needed       Thank you for coming to see [place] at [facility]. I hope we have been able to provide you high quality care today.  You may receive a patient satisfaction survey over the next few weeks. We would appreciate your feedback and comments so that we may continue to improve ourselves and the health of our patients.

## 2022-02-05 NOTE — Telephone Encounter (Signed)
Forms signed and faxed back to [phone], confirmation received.

## 2022-02-11 DIAGNOSIS — K1321 Leukoplakia of oral mucosa, including tongue: Secondary | ICD-10-CM | POA: Diagnosis not present

## 2022-03-25 DIAGNOSIS — E039 Hypothyroidism, unspecified: Secondary | ICD-10-CM | POA: Diagnosis not present

## 2022-03-29 DIAGNOSIS — H353112 Nonexudative age-related macular degeneration, right eye, intermediate dry stage: Secondary | ICD-10-CM | POA: Diagnosis not present

## 2022-03-29 DIAGNOSIS — H43811 Vitreous degeneration, right eye: Secondary | ICD-10-CM | POA: Diagnosis not present

## 2022-03-29 DIAGNOSIS — H353221 Exudative age-related macular degeneration, left eye, with active choroidal neovascularization: Secondary | ICD-10-CM | POA: Diagnosis not present

## 2022-03-29 DIAGNOSIS — Z961 Presence of intraocular lens: Secondary | ICD-10-CM | POA: Diagnosis not present

## 2022-04-18 DIAGNOSIS — H903 Sensorineural hearing loss, bilateral: Secondary | ICD-10-CM | POA: Diagnosis not present

## 2022-06-06 DIAGNOSIS — H353112 Nonexudative age-related macular degeneration, right eye, intermediate dry stage: Secondary | ICD-10-CM | POA: Diagnosis not present

## 2022-06-06 DIAGNOSIS — Z961 Presence of intraocular lens: Secondary | ICD-10-CM | POA: Diagnosis not present

## 2022-06-06 DIAGNOSIS — H43811 Vitreous degeneration, right eye: Secondary | ICD-10-CM | POA: Diagnosis not present

## 2022-06-06 DIAGNOSIS — H353221 Exudative age-related macular degeneration, left eye, with active choroidal neovascularization: Secondary | ICD-10-CM | POA: Diagnosis not present

## 2022-07-02 ENCOUNTER — Telehealth: Payer: Self-pay | Admitting: *Deleted

## 2022-07-02 NOTE — Patient Outreach (Signed)
  Care Coordination   Initial Visit Note   07/02/2022 Name: Tanya [name] MRN: [id number] DOB: [birth year]  Tanya [name] is a 89 y.o. year old female who sees Merri Brunette, MD for primary care. I  spoke with pt's family member by phone.  What matters to the patients health and wellness today?  CSW spoke with family by phone and introduced them to the Care Coordination program. Family declines need or interest at this time.      Goals Addressed   None     SDOH assessments and interventions completed:  No     Care Coordination Interventions:  No, not indicated   Follow up plan: No further intervention required.   Encounter Outcome:  Pt. Refused

## 2022-08-05 ENCOUNTER — Ambulatory Visit: Payer: PPO | Admitting: Adult Health

## 2022-08-05 ENCOUNTER — Encounter: Payer: Self-pay | Admitting: Adult Health

## 2022-08-05 VITALS — BP 138/68 | HR 80 | Ht 60.0 in | Wt 120.0 lb

## 2022-08-05 DIAGNOSIS — G3184 Mild cognitive impairment, so stated: Secondary | ICD-10-CM

## 2022-08-05 DIAGNOSIS — G459 Transient cerebral ischemic attack, unspecified: Secondary | ICD-10-CM

## 2022-08-05 NOTE — Patient Instructions (Addendum)
Continue Namenda 10 mg twice daily for memory impairment  Try to stay mentally and physically active  Continue clopidogrel 75 mg daily for secondary stroke prevention  Continue to follow up with PCP regarding blood pressure management  Maintain strict control of hypertension with blood pressure goal below 130/90  Signs of a Stroke? Follow the BEFAST method:  Balance Watch for a sudden loss of balance, trouble with coordination or vertigo Eyes Is there a sudden loss of vision in one or both eyes? Or double vision?  Face: Ask the person to smile. Does one side of the face droop or is it numb?  Arms: Ask the person to raise both arms. Does one arm drift downward? Is there weakness or numbness of a leg? Speech: Ask the person to repeat a simple phrase. Does the speech sound slurred/strange? Is the person confused ? Time: If you observe any of these signs, call 911.     Followup in the future with me in 6 months or call earlier if needed       Thank you for coming to see [place] at [facility]. I hope we have been able to provide you high quality care today.  You may receive a patient satisfaction survey over the next few weeks. We would appreciate your feedback and comments so that we may continue to improve ourselves and the health of our patients.

## 2022-08-05 NOTE — Progress Notes (Signed)
[facility] [address] [place]. [place] [address] [phone]       OFFICE FOLLOW UP NOTE  Tanya. Tanya [name] Date of Birth:  13-Mar-1925 Medical Record Number:  [id number]   Referring MD: Merri Brunette Reason for Referral: TIA, MCI   Chief Complaint  Patient presents with   Follow-up    Rm 3, here with daughter in law Junious Dresser Pt is following up on stroke. Pt states she has been doing well. Daughter in law states pt has been doing well.       HPI:   Update 08/05/2022 JM: Returns for follow-up visit accompanied by her daughter-in-law.  Stable from stroke standpoint without new stroke/TIA symptoms.  Initiated Namenda at prior visit, tolerating well, reports cognition has been stable since prior visit.  Lives alone, will call daughter in law every morning, continues to maintain ADLs independently, family continues to assist with IADLs. Ambulates with RW, recent fall a few weeks ago when bending down to pick up a book, hit top of head, did have bruising but no headache or neurological symptoms. No falls since then. Does read routinely. Does have some insomnia but once falls asleep, will stay asleep. Has tried melatonin before but no benefit. Will take a nap during the day.  No behavioral concerns.  Remains on Plavix, no side effects.  Routinely follows with PCP for stroke risk factor management.     History provided for reference purposes only Update 02/04/2022 JM: Patient returns for 13-month follow-up accompanied by her daughter-in-law.  Overall stable without new or reoccurring stroke/TIA symptoms.  Feels like cognition has been gradually declining. Lives alone, maintains ADLs independently.  Family assists with IADLs. Has been having more issues with hearing loss, has not yet been seen by audiology.   Compliant on Plavix.  Blood pressure well controlled.  Routinely follows with PCP.   She is requesting clearance to undergo single tooth extraction under local  anesthesia in need of holding Plavix currently scheduled 02/11/2022.  Update 07/16/2021 JM: Patient returns for 39-month follow-up visit accompanied by her daughter.  Overall stable without new or reoccurring stroke/TIA symptoms or any other transient neurological symptoms. Daughter is concerned regarding gradual memory decline and occasional confusion. Has good days/bad days. Denies any abrupt worsening.  Does continue to live alone, no longer has overnight aide supervision, family lives close by who checks on her frequently and assists with IADLs.  Able to maintain ADLs independently. Some concern of underlying depression. Relatively sedentary with limited physical activity, admits to doing no memory exercises. MMSE today 17/30 (prior 23/30 08/2019). Reports recent lab work with PCP back in May, has f/u visit next week. Compliant on Plavix, denies side effects.  Blood pressure today elevated, has not been routinely monitoring at home. PCP d/c'd amlodipine back in January.  No further concerns at this time.   Update, 01/11/2020 JM: Tanya [name] is here today for a stroke/seizure follow-up accompanied by daughter-in-law. Denies new stroke/TIA symptoms. No seizures. No new falls. PT/OT completed, but she does perform exercises occasionally. She continues to use a Rollator walker. She continues to live alone with overnight caregivers - DIL reports "clumsiness", and "slow" with performing some ADLs and requires assistance with IADLs. Remains on Plavix without side effects, Aspirin DC by PCP with burst eye vessel and associated black eye (was not previously stopped as Dr. Pearlean Brownie recommended). BP today 132/84. Labs A1c 5.8, LDL 69, and B12 1,201 08/15/2020, has labs scheduled for next week at PCP.  No  new concerns at this time  Update 10/11/2020 PS: Tanya [name] is a pleasant 89 year old Caucasian lady seen today for consultation visit for TIA.  She is accompanied by her daughter.  History is obtained from them and review of  electronic medical records and I personally reviewed pertinent imaging films in PACS.89 year old female with a PMHx of TIA, vitamine B12 deficiency, mild cognitive impairment, hearing loss, hypothyroidism, osteopenia and tremor who presented to the hospital on 08/12/20 after tripping and falling at home, fracturing her right hip (displaced right intertrochanteric femur fracture). She underwent intramedullary nailing on 8/7 /22 and since then has been able to ambulate. On 08/15/20 morning, she was noted by daughter to suddenly have slurred speech and right facial droop. LKN 11 AM. Code Stroke was called. She was on ASA CT scan of the head showed no acute abnormality with aspect score of 10.  CT angiogram of the brain and neck showed mild less than 50% bilateral carotid bifurcation stenosis with moderate left vertebral artery origin stenosis and moderate right paraclinoid and mild left paraclinoid stenosis.  Echocardiogram showed ejection fraction of 50 to 55% without definite cardiac source of embolism.  LDL cholesterol 69 mg percent hemoglobin A1c was 5.8.  Patient symptoms improved by the time she came back from CT scan.  She was started on dual antiplatelet therapy of aspirin and Plavix.  Patient has since done well she has been home and getting home physical Occupational Therapy.  She is able to ambulate with a Rollator.  She can walk short distances by herself.  She has had no recurrent TIA or stroke symptoms. Patient has previously been seen in the office and she had an episode on 05/05/2019 was felt to be complex partial seizure versus a TIA.  She also had mild age-related memory loss and cognitive impairment for which she takes Prevagen.  She is fairly independent despite her age lives alone and daughter lives nearby and provides some help which is mostly supervision.  She has had 2 or 3 falls in the last few years most of these are mechanical.  She denies any progressive memory loss or cognitive impairment or  incontinence.  Her MRI scan on 08/16/2020 had shown no acute abnormality but generalized atrophy and white matter changes as well as slight ventriculomegaly out of proportion to the atrophy but this appeared unchanged from previous MRI from 07/02/2019.[name]   Update 02/28/2020 JM: Tanya [name] returns for 45-month follow-up accompanied by daughter-in-law.   No new or reoccurring stroke/TIA symptoms Denies additional transient speech and AMS episodes Cognition stable without worsening and remains on Prevagen  Reports compliance on aspirin 81 mg daily -denies side effects Blood pressure today 138/78 Recent lipid panel showed LDL 84 (01/13/2020) Remains on B12 for B12 deficiency   Update 08/25/2019 JM: Tanya [name] is being seen for 68-month follow-up accompanied by her daughter in law.   Previously seen by Dr. Pearlean Brownie for transient speech and altered awareness episode of unknown etiology.   She has not had additional transient episodes or new neurological symptoms Cognition has been stable with MMSE today 23/30 Remains on Prevagen tolerating well Further extensive work-up including EEG, MRI, MRA and lab work completed.   Dementia panel did show B12 deficiency at 185 -advised to follow-up with PCP MR brain w wo contrast showed ventriculomegaly but felt likely chronic with history of meningitis All other work-up unremarkable No concerns at this time   Initial consult visit 06/21/2019 Dr. Pearlean Brownie: Tanya [name] is a pleasant  89 year old Caucasian lady seen today for initial office consultation visit. She is accompanied by her daughter-in-law. History is obtained from them and review of referral notes. I have personally reviewed imaging studies in PACS and electronic medical records. Patient states that on 05/05/2019 she and her granddaughter cleaned out some closets in her house and after that she felt tired and wanted to take a nap in a chair. Her other granddaughter called on the phone and when she was given the  phone to speak she had trouble speaking and was talking gibberish and garbled speech. She was also not able to comprehend what granddaughter and daughter-in-law spoke to her. Her symptoms are improving gradually took about 30 minutes. She did not go to the hospital but felt okay for the rest of the day. She was a little slow in thinking for a little while but that improved. She subsequently saw her primary care physician who ordered a CT scan of the head which was done on 05/20/2019 which shows enlarged ventricular size and some periventricular lucencies. 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 There is no acute abnormality. Carotid ultrasound on the same day shows bilateral carotid bifurcation plaques with less than 50% stenosis. Patient denies any known prior history of strokes, TIAs, seizures significant head injury with loss of consciousness. She however does remember his childhood illness possibly diphtheria which she was quite sick and had altered mental status for several days. [place] had to go to another city to get her some medications presumably antibiotics which worked and she gradually got better. She did not have any brain imaging at that time. The patient states she has been having some mild short-term memory difficulties over the years which may be progressing slightly. She does have some sundowning and gets irritated very easily and the daughter-in-law state that she tries to speak to her and get work done mostly in the morning part of the day. Patient denies any symptoms of gait difficulties gait ataxia falls or injuries. She walks quite well with good balance independently and very rarely needs a cane. She is independent and active daily living and recently has stopped driving. She denies any bladder urgency, frequency or incontinence. She has been referred to see neurosurgeon to discuss abnormal CT scan findings and normal pressure  hydrocephalus     ROS:   14 system review of systems is positive for memory loss and all other systems negative  PMH:  Past Medical History:  Diagnosis Date   Hearing loss    Hypothyroidism    Neutropenia (HCC)    Osteopenia    Primary osteoarthritis    Tremor     Social History:  Social History   Socioeconomic History   Marital status: Widowed    Spouse name: Not on file   Number of children: Not on file   Years of education: Not on file   Highest education level: Not on file  Occupational History   Not on file  Tobacco Use   Smoking status: Never   Smokeless tobacco: Never  Substance and Sexual Activity   Alcohol use: Yes    Alcohol/week: 1.0 standard drink of alcohol    Types: 1 Glasses of wine per week    Comment: bourbon per day one cup prn   Drug use: Not Currently   Sexual activity: Not Currently  Other Topics Concern   Not on file  Social History Narrative   Not  on file   Social Determinants of Health   Financial Resource Strain: Not on file  Food Insecurity: Not on file  Transportation Needs: Not on file  Physical Activity: Not on file  Stress: Not on file  Social Connections: Not on file  Intimate Partner Violence: Not on file    Medications:   Current Outpatient Medications on File Prior to Visit  Medication Sig Dispense Refill   amLODipine (NORVASC) 2.5 MG tablet Take 1 tablet (2.5 mg total) by mouth every evening. 30 tablet 0   Cholecalciferol (VITAMIN D3) 50 MCG (2000 UT) TABS Take 2,000 Units by mouth daily.     clopidogrel (PLAVIX) 75 MG tablet Take 1 tablet (75 mg total) by mouth daily. 30 tablet 0   ferrous sulfate 325 (65 FE) MG tablet Take 1 tablet (325 mg total) by mouth in the morning and at bedtime. 60 tablet 0   levothyroxine (SYNTHROID) 112 MCG tablet Take 112 mcg by mouth daily before breakfast.     magnesium oxide (MAG-OX) 400 MG tablet Take 1 tablet (400 mg total) by mouth daily. 30 tablet 0   memantine (NAMENDA) 10 MG  tablet Take 1 tablet (10 mg total) by mouth 2 (two) times daily. 60 tablet 5   NON FORMULARY Diet:NAS     vitamin B-12 (CYANOCOBALAMIN) 100 MCG tablet Take 100 mcg by mouth daily.     memantine (NAMENDA TITRATION PAK) tablet pack 5 mg/day for =1 week; 5 mg twice daily for =1 week; 15 mg/day given in 5 mg and 10 mg separated doses for =1 week; then 10 mg twice daily 49 tablet 0   No current facility-administered medications on file prior to visit.    Allergies:   Allergies  Allergen Reactions   Codeine Other (See Comments)    Extreme headaches   Shellfish Allergy Hives   Shellfish-Derived Products Hives   Amoxicillin Itching and Rash    Today's Vitals   08/05/22 1431  BP: 138/68  Pulse: 80  Weight: 120 lb (54.4 kg)  Height: 5' (1.524 m)   Body mass index is 23.44 kg/m.  Physical Exam General: Frail very pleasant elderly Caucasian lady, seated, in no evident distress Head: head normocephalic and atraumatic.   Neck: supple with no carotid or supraclavicular bruits Cardiovascular: regular rate and rhythm, no murmurs Musculoskeletal: no deformity Skin:  no rash few scattered petichiae Vascular:  Normal pulses all extremities  Neurologic Exam Mental Status: Awake and fully alert.  Fluent speech and language. Recent and remote memory diminished. Attention span, concentration and fund of knowledge diminished mood and affect appropriate.     02/04/2022    1:44 PM 07/16/2021    9:47 AM 08/25/2019    2:42 PM  MMSE - Mini Mental State Exam  Orientation to time 3 1 3   Orientation to Place 5 2 4   Registration 3 3 2   Attention/ Calculation 1 0 5  Recall 2 3 1   Language- name 2 objects 2 2 2   Language- repeat 1 1 1   Language- follow 3 step command 3 3 3   Language- read & follow direction 1 1 1   Write a sentence 1 1 1   Copy design 1 0 0  Total score 23 17 23    Cranial Nerves: Pupils equal, briskly reactive to light. Extraocular movements full without nystagmus. Visual fields  full to confrontation. Hearing diminished bilaterally despite hearing aid. Facial sensation intact. Face, tongue, palate moves normally and symmetrically.  Motor: Normal bulk and tone. Normal strength  in all tested extremity muscles. Sensory.: intact to touch , pinprick , position and vibratory sensation.  Coordination: Rapid alternating movements normal in all extremities. Finger-to-nose and heel-to-shin performed accurately bilaterally. Gait and Station: Arises from chair without difficulty. Stance is hunched. Gait demonstrates normal balance mild favoring of right leg. She uses a rollator walker. Reflexes: 1+ and symmetric. Toes downgoing.      ASSESSMENT/PLAN: 89 year old pleasant Caucasian lady with transient episode of speech and language difficulties with altered awareness in 04/2019 of unclear etiology possibilities include left hemispheric TIA versus complex partial seizure and transient episode of slurred speech and right facial droop in 08/2020. Vascular risk factors of hypertension, intra and extracranial stenosis and age.  She also has mild cognitive impairment.  Her brain scans have shown ventriculomegaly out of proportion to the degree of atrophy but her clinical presentation is not compatible with normal pressure hydrocephalus and prominent ventricles have been documented in her prior imaging studies going as far back as MRI in 2002.     History of stroke/TIA -Continue Plavix 75mg  daily for secondary stroke -maintain aggressive risk factor modification with strict control of hypertension with blood pressure goal below 130/90, lipids with LDL cholesterol goal below 70 mg percent and maintain a healthy diet and be active and exercise regularly.    Mild cognitive impairment -Subjectively stable over the past 6 months -MMSE 23/30 (01/2022) - will repeat at f/u visit -Continue Namenda 10 mg twice daily -Discussed importance of increasing participation with memory exercises and physical  exercises as well as importance of managing vascular risk factors     Follow up in 6 months or call earlier if needed    CC:  Merri Brunette, MD    I spent 31 minutes of face-to-face and non-face-to-face time with patient and DIL.  This included previsit chart review, lab review, study review, electronic health record documentation, patient and DIL education and discussion regarding above diagnoses and treatment plan and answered all other questions to patient and daughter-in-law satisfaction  Ihor Austin, [facility]  [facility] [address] [place], [place] [address]  Phone [phone] Fax [phone] Note: This document was prepared with digital dictation and possible smart phrase technology. Any transcriptional errors that result from this process are unintentional.

## 2022-08-29 DIAGNOSIS — H43811 Vitreous degeneration, right eye: Secondary | ICD-10-CM | POA: Diagnosis not present

## 2022-08-29 DIAGNOSIS — Z961 Presence of intraocular lens: Secondary | ICD-10-CM | POA: Diagnosis not present

## 2022-08-29 DIAGNOSIS — H353221 Exudative age-related macular degeneration, left eye, with active choroidal neovascularization: Secondary | ICD-10-CM | POA: Diagnosis not present

## 2022-08-29 DIAGNOSIS — H353112 Nonexudative age-related macular degeneration, right eye, intermediate dry stage: Secondary | ICD-10-CM | POA: Diagnosis not present

## 2022-09-16 ENCOUNTER — Other Ambulatory Visit: Payer: Self-pay | Admitting: Anesthesiology

## 2022-09-16 ENCOUNTER — Telehealth: Payer: Self-pay | Admitting: Adult Health

## 2022-09-16 MED ORDER — MEMANTINE HCL 10 MG PO TABS: 10.0000 mg | ORAL_TABLET | Freq: Two times a day (BID) | ORAL | 1 refills | Status: DC

## 2022-09-16 NOTE — Telephone Encounter (Signed)
Pt is needing a refill on her memantine (NAMENDA) 10 MG tablet sent in to the [facility] on [address]

## 2022-09-16 NOTE — Telephone Encounter (Signed)
Refill sent for the pt as requested

## 2022-12-12 DIAGNOSIS — H353112 Nonexudative age-related macular degeneration, right eye, intermediate dry stage: Secondary | ICD-10-CM | POA: Diagnosis not present

## 2022-12-12 DIAGNOSIS — Z961 Presence of intraocular lens: Secondary | ICD-10-CM | POA: Diagnosis not present

## 2022-12-12 DIAGNOSIS — H43811 Vitreous degeneration, right eye: Secondary | ICD-10-CM | POA: Diagnosis not present

## 2022-12-12 DIAGNOSIS — H353221 Exudative age-related macular degeneration, left eye, with active choroidal neovascularization: Secondary | ICD-10-CM | POA: Diagnosis not present

## 2023-01-03 DIAGNOSIS — H40023 Open angle with borderline findings, high risk, bilateral: Secondary | ICD-10-CM | POA: Diagnosis not present

## 2023-01-03 DIAGNOSIS — H524 Presbyopia: Secondary | ICD-10-CM | POA: Diagnosis not present

## 2023-01-03 DIAGNOSIS — H35352 Cystoid macular degeneration, left eye: Secondary | ICD-10-CM | POA: Diagnosis not present

## 2023-01-03 DIAGNOSIS — H5203 Hypermetropia, bilateral: Secondary | ICD-10-CM | POA: Diagnosis not present

## 2023-01-03 DIAGNOSIS — H353132 Nonexudative age-related macular degeneration, bilateral, intermediate dry stage: Secondary | ICD-10-CM | POA: Diagnosis not present

## 2023-01-03 DIAGNOSIS — H02133 Senile ectropion of right eye, unspecified eyelid: Secondary | ICD-10-CM | POA: Diagnosis not present

## 2023-01-03 DIAGNOSIS — H04123 Dry eye syndrome of bilateral lacrimal glands: Secondary | ICD-10-CM | POA: Diagnosis not present

## 2023-01-03 DIAGNOSIS — Z961 Presence of intraocular lens: Secondary | ICD-10-CM | POA: Diagnosis not present

## 2023-01-03 DIAGNOSIS — H52223 Regular astigmatism, bilateral: Secondary | ICD-10-CM | POA: Diagnosis not present

## 2023-01-22 DIAGNOSIS — E559 Vitamin D deficiency, unspecified: Secondary | ICD-10-CM | POA: Diagnosis not present

## 2023-01-22 DIAGNOSIS — D508 Other iron deficiency anemias: Secondary | ICD-10-CM | POA: Diagnosis not present

## 2023-01-22 DIAGNOSIS — E538 Deficiency of other specified B group vitamins: Secondary | ICD-10-CM | POA: Diagnosis not present

## 2023-01-22 DIAGNOSIS — E039 Hypothyroidism, unspecified: Secondary | ICD-10-CM | POA: Diagnosis not present

## 2023-01-22 DIAGNOSIS — I10 Essential (primary) hypertension: Secondary | ICD-10-CM | POA: Diagnosis not present

## 2023-01-23 LAB — LAB REPORT - SCANNED
Calcium: 9.1
EGFR: 80
TSH: 2.95 (ref 0.41–5.90)

## 2023-01-29 DIAGNOSIS — Z0000 Encounter for general adult medical examination without abnormal findings: Secondary | ICD-10-CM | POA: Diagnosis not present

## 2023-01-29 DIAGNOSIS — E039 Hypothyroidism, unspecified: Secondary | ICD-10-CM | POA: Diagnosis not present

## 2023-01-29 DIAGNOSIS — F5101 Primary insomnia: Secondary | ICD-10-CM | POA: Diagnosis not present

## 2023-01-29 DIAGNOSIS — R21 Rash and other nonspecific skin eruption: Secondary | ICD-10-CM | POA: Diagnosis not present

## 2023-02-03 NOTE — Progress Notes (Unsigned)
[facility] [address] [place]. [place] [address] [phone]       OFFICE FOLLOW UP NOTE  Tanya [name] Date of Birth:  [birth year] Medical Record Number:  [id number]   Referring MD: Merri Brunette Reason for Referral: TIA, MCI   No chief complaint on file.     HPI:   Update 02/04/2023 JM: Patient returns for follow-up visit.  Overall stable since prior visit.  No new stroke/TIA symptoms.  Reports cognition ***.  Continues on memantine without side effects.  Continues to live alone, maintains ADLs independently, ambulates with RW, no recent falls.  Family assists with IADLs.  Routinely follows with PCP for stroke risk factor management.         History provided for reference purposes only Update 08/05/2022 JM: Returns for follow-up visit accompanied by her daughter-in-law.  Stable from stroke standpoint without new stroke/TIA symptoms.  Initiated Namenda at prior visit, tolerating well, reports cognition has been stable since prior visit.  Lives alone, will call daughter in law every morning, continues to maintain ADLs independently, family continues to assist with IADLs. Ambulates with RW, recent fall a few weeks ago when bending down to pick up a book, hit top of head, did have bruising but no headache or neurological symptoms. No falls since then. Does read routinely. Does have some insomnia but once falls asleep, will stay asleep. Has tried melatonin before but no benefit. Will take a nap during the day.  No behavioral concerns.  Remains on Plavix, no side effects.  Routinely follows with PCP for stroke risk factor management.  Update 02/04/2022 JM: Patient returns for 65-month follow-up accompanied by her daughter-in-law.  Overall stable without new or reoccurring stroke/TIA symptoms.  Feels like cognition has been gradually declining. Lives alone, maintains ADLs independently.  Family assists with IADLs. Has been having more issues with hearing loss, has  not yet been seen by audiology.   Compliant on Plavix.  Blood pressure well controlled.  Routinely follows with PCP.   She is requesting clearance to undergo single tooth extraction under local anesthesia in need of holding Plavix currently scheduled 02/11/2022.  Update 07/16/2021 JM: Patient returns for 37-month follow-up visit accompanied by her daughter.  Overall stable without new or reoccurring stroke/TIA symptoms or any other transient neurological symptoms. Daughter is concerned regarding gradual memory decline and occasional confusion. Has good days/bad days. Denies any abrupt worsening.  Does continue to live alone, no longer has overnight aide supervision, family lives close by who checks on her frequently and assists with IADLs.  Able to maintain ADLs independently. Some concern of underlying depression. Relatively sedentary with limited physical activity, admits to doing no memory exercises. MMSE today 17/30 (prior 23/30 08/2019). Reports recent lab work with PCP back in May, has f/u visit next week. Compliant on Plavix, denies side effects.  Blood pressure today elevated, has not been routinely monitoring at home. PCP d/c'd amlodipine back in January.  No further concerns at this time.   Update, 01/11/2020 JM: Tanya [name] is here today for a stroke/seizure follow-up accompanied by daughter-in-law. Denies new stroke/TIA symptoms. No seizures. No new falls. PT/OT completed, but she does perform exercises occasionally. She continues to use a Rollator walker. She continues to live alone with overnight caregivers - DIL reports "clumsiness", and "slow" with performing some ADLs and requires assistance with IADLs. Remains on Plavix without side effects, Aspirin DC by PCP with burst eye vessel and associated black eye (was not previously stopped as  Dr. Pearlean Brownie recommended). BP today 132/84. Labs A1c 5.8, LDL 69, and B12 1,201 08/15/2020, has labs scheduled for next week at PCP.  No new concerns at this  time  Update 10/11/2020 PS: Tanya [name] is a pleasant 89 year old Caucasian lady seen today for consultation visit for TIA.  She is accompanied by her daughter.  History is obtained from them and review of electronic medical records and I personally reviewed pertinent imaging films in PACS.89 year old female with a PMHx of TIA, vitamine B12 deficiency, mild cognitive impairment, hearing loss, hypothyroidism, osteopenia and tremor who presented to the hospital on 08/12/20 after tripping and falling at home, fracturing her right hip (displaced right intertrochanteric femur fracture). She underwent intramedullary nailing on 8/7 /22 and since then has been able to ambulate. On 08/15/20 morning, she was noted by daughter to suddenly have slurred speech and right facial droop. LKN 11 AM. Code Stroke was called. She was on ASA CT scan of the head showed no acute abnormality with aspect score of 10.  CT angiogram of the brain and neck showed mild less than 50% bilateral carotid bifurcation stenosis with moderate left vertebral artery origin stenosis and moderate right paraclinoid and mild left paraclinoid stenosis.  Echocardiogram showed ejection fraction of 50 to 55% without definite cardiac source of embolism.  LDL cholesterol 69 mg percent hemoglobin A1c was 5.8.  Patient symptoms improved by the time she came back from CT scan.  She was started on dual antiplatelet therapy of aspirin and Plavix.  Patient has since done well she has been home and getting home physical Occupational Therapy.  She is able to ambulate with a Rollator.  She can walk short distances by herself.  She has had no recurrent TIA or stroke symptoms. Patient has previously been seen in the office and she had an episode on 05/05/2019 was felt to be complex partial seizure versus a TIA.  She also had mild age-related memory loss and cognitive impairment for which she takes Prevagen.  She is fairly independent despite her age lives alone and daughter lives  nearby and provides some help which is mostly supervision.  She has had 2 or 3 falls in the last few years most of these are mechanical.  She denies any progressive memory loss or cognitive impairment or incontinence.  Her MRI scan on 08/16/2020 had shown no acute abnormality but generalized atrophy and white matter changes as well as slight ventriculomegaly out of proportion to the atrophy but this appeared unchanged from previous MRI from 07/02/2019.[name]   Update 02/28/2020 JM: Tanya [name] returns for 6-month follow-up accompanied by daughter-in-law.   No new or reoccurring stroke/TIA symptoms Denies additional transient speech and AMS episodes Cognition stable without worsening and remains on Prevagen  Reports compliance on aspirin 81 mg daily -denies side effects Blood pressure today 138/78 Recent lipid panel showed LDL 84 (01/13/2020) Remains on B12 for B12 deficiency   Update 08/25/2019 JM: Tanya. [name] is being seen for 46-month follow-up accompanied by her daughter in law.   Previously seen by Dr. Pearlean Brownie for transient speech and altered awareness episode of unknown etiology.   She has not had additional transient episodes or new neurological symptoms Cognition has been stable with MMSE today 23/30 Remains on Prevagen tolerating well Further extensive work-up including EEG, MRI, MRA and lab work completed.   Dementia panel did show B12 deficiency at 185 -advised to follow-up with PCP MR brain w wo contrast showed ventriculomegaly but felt likely chronic with  history of meningitis All other work-up unremarkable No concerns at this time   Initial consult visit 06/21/2019 Dr. Pearlean Brownie: Tanya [name] is a pleasant 89 year old Caucasian lady seen today for initial office consultation visit. She is accompanied by her daughter-in-law. History is obtained from them and review of referral notes. I have personally reviewed imaging studies in PACS and electronic medical records. Patient states that on 05/05/2019  she and her granddaughter cleaned out some closets in her house and after that she felt tired and wanted to take a nap in a chair. Her other granddaughter called on the phone and when she was given the phone to speak she had trouble speaking and was talking gibberish and garbled speech. She was also not able to comprehend what granddaughter and daughter-in-law spoke to her. Her symptoms are improving gradually took about 30 minutes. She did not go to the hospital but felt okay for the rest of the day. She was a little slow in thinking for a little while but that improved. She subsequently saw her primary care physician who ordered a CT scan of the head which was done on 05/20/2019 which shows enlarged ventricular size and some periventricular lucencies. 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 There is no acute abnormality. Carotid ultrasound on the same day shows bilateral carotid bifurcation plaques with less than 50% stenosis. Patient denies any known prior history of strokes, TIAs, seizures significant head injury with loss of consciousness. She however does remember his childhood illness possibly diphtheria which she was quite sick and had altered mental status for several days. [place] had to go to another city to get her some medications presumably antibiotics which worked and she gradually got better. She did not have any brain imaging at that time. The patient states she has been having some mild short-term memory difficulties over the years which may be progressing slightly. She does have some sundowning and gets irritated very easily and the daughter-in-law state that she tries to speak to her and get work done mostly in the morning part of the day. Patient denies any symptoms of gait difficulties gait ataxia falls or injuries. She walks quite well with good balance independently and very rarely needs a cane. She is independent and active  daily living and recently has stopped driving. She denies any bladder urgency, frequency or incontinence. She has been referred to see neurosurgeon to discuss abnormal CT scan findings and normal pressure hydrocephalus     ROS:   14 system review of systems is positive for memory loss and all other systems negative  PMH:  Past Medical History:  Diagnosis Date   Hearing loss    Hypothyroidism    Neutropenia (HCC)    Osteopenia    Primary osteoarthritis    Tremor     Social History:  Social History   Socioeconomic History   Marital status: Widowed    Spouse name: Not on file   Number of children: Not on file   Years of education: Not on file   Highest education level: Not on file  Occupational History   Not on file  Tobacco Use   Smoking status: Never   Smokeless tobacco: Never  Substance and Sexual Activity   Alcohol use: Yes    Alcohol/week: 1.0 standard drink of alcohol    Types: 1 Glasses of wine per week    Comment: bourbon per day one cup prn   Drug  use: Not Currently   Sexual activity: Not Currently  Other Topics Concern   Not on file  Social History Narrative   Not on file   Social Drivers of Health   Financial Resource Strain: Not on file  Food Insecurity: Not on file  Transportation Needs: Not on file  Physical Activity: Not on file  Stress: Not on file  Social Connections: Not on file  Intimate Partner Violence: Not on file    Medications:   Current Outpatient Medications on File Prior to Visit  Medication Sig Dispense Refill   amLODipine (NORVASC) 2.5 MG tablet Take 1 tablet (2.5 mg total) by mouth every evening. 30 tablet 0   Cholecalciferol (VITAMIN D3) 50 MCG (2000 UT) TABS Take 2,000 Units by mouth daily.     clopidogrel (PLAVIX) 75 MG tablet Take 1 tablet (75 mg total) by mouth daily. 30 tablet 0   ferrous sulfate 325 (65 FE) MG tablet Take 1 tablet (325 mg total) by mouth in the morning and at bedtime. 60 tablet 0   levothyroxine  (SYNTHROID) 112 MCG tablet Take 112 mcg by mouth daily before breakfast.     magnesium oxide (MAG-OX) 400 MG tablet Take 1 tablet (400 mg total) by mouth daily. 30 tablet 0   memantine (NAMENDA) 10 MG tablet Take 1 tablet (10 mg total) by mouth 2 (two) times daily. 180 tablet 1   NON FORMULARY Diet:NAS     vitamin B-12 (CYANOCOBALAMIN) 100 MCG tablet Take 100 mcg by mouth daily.     No current facility-administered medications on file prior to visit.    Allergies:   Allergies  Allergen Reactions   Codeine Other (See Comments)    Extreme headaches   Shellfish Allergy Hives   Shellfish-Derived Products Hives   Amoxicillin Itching and Rash    There were no vitals filed for this visit.  There is no height or weight on file to calculate BMI.  Physical Exam General: Frail very pleasant elderly Caucasian lady, seated, in no evident distress Head: head normocephalic and atraumatic.   Neck: supple with no carotid or supraclavicular bruits Cardiovascular: regular rate and rhythm, no murmurs Musculoskeletal: no deformity Skin:  no rash few scattered petichiae Vascular:  Normal pulses all extremities  Neurologic Exam Mental Status: Awake and fully alert.  Fluent speech and language. Recent and remote memory diminished. Attention span, concentration and fund of knowledge diminished mood and affect appropriate.     02/04/2022    1:44 PM 07/16/2021    9:47 AM 08/25/2019    2:42 PM  MMSE - Mini Mental State Exam  Orientation to time 3 1 3   Orientation to Place 5 2 4   Registration 3 3 2   Attention/ Calculation 1 0 5  Recall 2 3 1   Language- name 2 objects 2 2 2   Language- repeat 1 1 1   Language- follow 3 step command 3 3 3   Language- read & follow direction 1 1 1   Write a sentence 1 1 1   Copy design 1 0 0  Total score 23 17 23    Cranial Nerves: Pupils equal, briskly reactive to light. Extraocular movements full without nystagmus. Visual fields full to confrontation. Hearing  diminished bilaterally despite hearing aid. Facial sensation intact. Face, tongue, palate moves normally and symmetrically.  Motor: Normal bulk and tone. Normal strength in all tested extremity muscles. Sensory.: intact to touch , pinprick , position and vibratory sensation.  Coordination: Rapid alternating movements normal in all extremities. Finger-to-nose and heel-to-shin  performed accurately bilaterally. Gait and Station: Arises from chair without difficulty. Stance is hunched. Gait demonstrates normal balance mild favoring of right leg. She uses a rollator walker. Reflexes: 1+ and symmetric. Toes downgoing.      ASSESSMENT/PLAN: 89 year old pleasant Caucasian lady with transient episode of speech and language difficulties with altered awareness in 04/2019 of unclear etiology possibilities include left hemispheric TIA versus complex partial seizure and transient episode of slurred speech and right facial droop in 08/2020. Vascular risk factors of hypertension, intra and extracranial stenosis and age.  She also has mild cognitive impairment.  Her brain scans have shown ventriculomegaly out of proportion to the degree of atrophy but her clinical presentation is not compatible with normal pressure hydrocephalus and prominent ventricles have been documented in her prior imaging studies going as far back as MRI in 2002.     History of stroke/TIA -Continue Plavix 75mg  daily for secondary stroke -maintain aggressive risk factor modification with strict control of hypertension with blood pressure goal below 130/90, lipids with LDL cholesterol goal below 70 mg percent and maintain a healthy diet and be active and exercise regularly.    Mild cognitive impairment -Subjectively stable over the past 6 months -MMSE ***/30 (prior 23/30) - will repeat at f/u visit -Continue Namenda 10 mg twice daily -Discussed importance of increasing participation with memory exercises and physical exercises as well as  importance of managing vascular risk factors     Follow up in 6 months or call earlier if needed    CC:  Merri Brunette, MD    I spent 31 minutes of face-to-face and non-face-to-face time with patient and DIL.  This included previsit chart review, lab review, study review, electronic health record documentation, patient and DIL education and discussion regarding above diagnoses and treatment plan and answered all other questions to patient and daughter-in-law satisfaction  Ihor Austin, [facility]  [facility] [address] [place], [place] [address]  Phone [phone] Fax [phone] Note: This document was prepared with digital dictation and possible smart phrase technology. Any transcriptional errors that result from this process are unintentional.

## 2023-02-04 ENCOUNTER — Ambulatory Visit: Payer: PPO | Admitting: Adult Health

## 2023-02-04 ENCOUNTER — Encounter: Payer: Self-pay | Admitting: Adult Health

## 2023-02-04 VITALS — BP 120/72 | HR 104 | Ht 60.0 in | Wt 129.2 lb

## 2023-02-04 DIAGNOSIS — G459 Transient cerebral ischemic attack, unspecified: Secondary | ICD-10-CM

## 2023-02-04 DIAGNOSIS — G3184 Mild cognitive impairment, so stated: Secondary | ICD-10-CM | POA: Diagnosis not present

## 2023-02-04 MED ORDER — MEMANTINE HCL 10 MG PO TABS: 10.0000 mg | ORAL_TABLET | Freq: Two times a day (BID) | ORAL | 3 refills | Status: DC

## 2023-02-04 NOTE — Patient Instructions (Addendum)
Your Plan:  Continue Namenda 10 mg twice daily  Ensure routine memory exercises as well as routine physical activity/exercise    Follow-up in 6 months or call earlier if needed     Thank you for coming to see [place] at [facility]. I hope we have been able to provide you high quality care today.  You may receive a patient satisfaction survey over the next few weeks. We would appreciate your feedback and comments so that we may continue to improve ourselves and the health of our patients.

## 2023-03-19 DIAGNOSIS — L820 Inflamed seborrheic keratosis: Secondary | ICD-10-CM | POA: Diagnosis not present

## 2023-04-03 DIAGNOSIS — H353221 Exudative age-related macular degeneration, left eye, with active choroidal neovascularization: Secondary | ICD-10-CM | POA: Diagnosis not present

## 2023-04-03 DIAGNOSIS — H353112 Nonexudative age-related macular degeneration, right eye, intermediate dry stage: Secondary | ICD-10-CM | POA: Diagnosis not present

## 2023-04-03 DIAGNOSIS — H43811 Vitreous degeneration, right eye: Secondary | ICD-10-CM | POA: Diagnosis not present

## 2023-04-03 DIAGNOSIS — Z961 Presence of intraocular lens: Secondary | ICD-10-CM | POA: Diagnosis not present

## 2023-04-10 DIAGNOSIS — H353132 Nonexudative age-related macular degeneration, bilateral, intermediate dry stage: Secondary | ICD-10-CM | POA: Diagnosis not present

## 2023-04-10 DIAGNOSIS — H40023 Open angle with borderline findings, high risk, bilateral: Secondary | ICD-10-CM | POA: Diagnosis not present

## 2023-04-10 DIAGNOSIS — H52223 Regular astigmatism, bilateral: Secondary | ICD-10-CM | POA: Diagnosis not present

## 2023-04-10 DIAGNOSIS — H04123 Dry eye syndrome of bilateral lacrimal glands: Secondary | ICD-10-CM | POA: Diagnosis not present

## 2023-04-10 DIAGNOSIS — H5203 Hypermetropia, bilateral: Secondary | ICD-10-CM | POA: Diagnosis not present

## 2023-04-10 DIAGNOSIS — Z961 Presence of intraocular lens: Secondary | ICD-10-CM | POA: Diagnosis not present

## 2023-04-10 DIAGNOSIS — H02133 Senile ectropion of right eye, unspecified eyelid: Secondary | ICD-10-CM | POA: Diagnosis not present

## 2023-04-10 DIAGNOSIS — H524 Presbyopia: Secondary | ICD-10-CM | POA: Diagnosis not present

## 2023-04-30 DIAGNOSIS — X32XXXA Exposure to sunlight, initial encounter: Secondary | ICD-10-CM | POA: Diagnosis not present

## 2023-04-30 DIAGNOSIS — L570 Actinic keratosis: Secondary | ICD-10-CM | POA: Diagnosis not present

## 2023-04-30 DIAGNOSIS — L820 Inflamed seborrheic keratosis: Secondary | ICD-10-CM | POA: Diagnosis not present

## 2023-05-21 ENCOUNTER — Ambulatory Visit: Admitting: Podiatry

## 2023-05-21 ENCOUNTER — Encounter: Payer: Self-pay | Admitting: Podiatry

## 2023-05-21 VITALS — Ht 60.0 in | Wt 129.2 lb

## 2023-05-21 DIAGNOSIS — B351 Tinea unguium: Secondary | ICD-10-CM | POA: Diagnosis not present

## 2023-05-21 DIAGNOSIS — M79675 Pain in left toe(s): Secondary | ICD-10-CM

## 2023-05-21 DIAGNOSIS — M79674 Pain in right toe(s): Secondary | ICD-10-CM | POA: Diagnosis not present

## 2023-05-21 DIAGNOSIS — I999 Unspecified disorder of circulatory system: Secondary | ICD-10-CM

## 2023-05-22 NOTE — Progress Notes (Signed)
 Subjective:   Patient ID: Tanya [name], female   DOB: 89 y.o.   MRN: [id number]   HPI Patient presents with caregiver with a lot of pain in the nailbeds of both feet and the left hallux nailbed very thickened and sore with no ability for her to trim or caregiver.  Patient does not smoke and is not active   Review of Systems  All other systems reviewed and are negative.       Objective:  Physical Exam Vitals and nursing note reviewed.  Constitutional:      Appearance: She is well-developed.  Pulmonary:     Effort: Pulmonary effort is normal.  Musculoskeletal:        General: Normal range of motion.  Skin:    General: Skin is warm.  Neurological:     Mental Status: She is alert.     Neurovascular status found to be intact muscle strength was found to be adequate range of motion adequate with significant discoloration and thickness of nailbeds 1-5 both feet with severe deformity of the left big toenail with elongation pattern damage and multiple indications of trauma.  Good digital perfusion well-oriented     Assessment:  Chronic mycotic nail infection with pain 1-5 bilateral with severe trauma and ingrown component left big toenail     Plan:  HV and reviewed with her and her caregiver conditions and treatment options.  We could consider nail removal left but I do like to hold off on this due to advanced age but may be necessary in future and I went ahead today and educated them on permanent correction along with caregiver.  I did debride nailbeds 1-5 both feet today no iatrogenic bleeding will be seen back with consideration for more aggressive treatment

## 2023-07-31 DIAGNOSIS — H43811 Vitreous degeneration, right eye: Secondary | ICD-10-CM | POA: Diagnosis not present

## 2023-07-31 DIAGNOSIS — Z961 Presence of intraocular lens: Secondary | ICD-10-CM | POA: Diagnosis not present

## 2023-07-31 DIAGNOSIS — H353221 Exudative age-related macular degeneration, left eye, with active choroidal neovascularization: Secondary | ICD-10-CM | POA: Diagnosis not present

## 2023-07-31 DIAGNOSIS — H353112 Nonexudative age-related macular degeneration, right eye, intermediate dry stage: Secondary | ICD-10-CM | POA: Diagnosis not present

## 2023-08-14 NOTE — Progress Notes (Signed)
 [facility] [address] [place]. [place] [address] [phone]       OFFICE FOLLOW UP NOTE  [name] Date of Birth:  12/09/1925 Medical Record Number:  [id number]   Referring MD: Ryan Hives Reason for Referral: TIA, MCI   Chief Complaint  Patient presents with   Follow-up    RM 3, Pt w/daughter. Here for f/u with memory.      HPI:   Update 08/18/2023 JM: Patient returns for follow-up visit accompanied by her daughter-in-law.  Reports overall doing well since prior visit.  No new stroke/TIA symptoms.  Remains on Plavix  without side effects.  Cognition overall stable without any noticeable decline.  MMSE 19/30, prior 17/30.  Remains on Namenda  without side effects.  Continues to live alone, able to maintain ADLs independently, very supportive family who assists with IADLs.  Ambulates with rolling walker, does report a fall over the weekend, turned off a light and tripped over a table, thankfully no significant injury and denies hitting her head, denies any other fall. Tries to stay active going for walks 1-2x per day, has been limited recently due to rain. Continues occasional issues with insomnia, about 1-2x per week, previously tried trazodone and melatonin without benefit, reports daily use of magnesium  oxide.  Has follow-up with PCP in January.  No questions or concerns at this time.     History provided for reference purposes only Update 02/04/2023 JM: Patient returns for follow-up visit accompanied by her daughter in law, Tanya [name].  Overall stable since prior visit.  No new stroke/TIA symptoms.  Reports cognition has been stable, denies any decline since prior visit.  Continues on memantine  without side effects.  She enjoys reading but does not do any other memory exercises as she does not enjoy doing them.  She tries to stay active.  Continues to live alone, maintains ADLs independently, ambulates with RW, no recent falls.  Family assists with IADLs,  son sets up medications in weekly pill boxes.  Routinely follows with PCP for stroke risk factor management.  She recently started on trazodone by PCP due to difficulty initiating and maintaining sleep, has helped some, denies side effects.  Update 08/05/2022 JM: Returns for follow-up visit accompanied by her daughter-in-law.  Stable from stroke standpoint without new stroke/TIA symptoms.  Initiated Namenda  at prior visit, tolerating well, reports cognition has been stable since prior visit.  Lives alone, will call daughter in law every morning, continues to maintain ADLs independently, family continues to assist with IADLs. Ambulates with RW, recent fall a few weeks ago when bending down to pick up a book, hit top of head, did have bruising but no headache or neurological symptoms. No falls since then. Does read routinely. Does have some insomnia but once falls asleep, will stay asleep. Has tried melatonin before but no benefit. Will take a nap during the day.  No behavioral concerns.  Remains on Plavix , no side effects.  Routinely follows with PCP for stroke risk factor management.  Update 02/04/2022 JM: Patient returns for 47-month follow-up accompanied by her daughter-in-law.  Overall stable without new or reoccurring stroke/TIA symptoms.  Feels like cognition has been gradually declining. Lives alone, maintains ADLs independently.  Family assists with IADLs. Has been having more issues with hearing loss, has not yet been seen by audiology.   Compliant on Plavix .  Blood pressure well controlled.  Routinely follows with PCP.   She is requesting clearance to undergo single tooth extraction under local anesthesia in  need of holding Plavix  currently scheduled 02/11/2022.  Update 07/16/2021 JM: Patient returns for 8-month follow-up visit accompanied by her daughter.  Overall stable without new or reoccurring stroke/TIA symptoms or any other transient neurological symptoms. Daughter is concerned regarding gradual  memory decline and occasional confusion. Has good days/bad days. Denies any abrupt worsening.  Does continue to live alone, no longer has overnight aide supervision, family lives close by who checks on her frequently and assists with IADLs.  Able to maintain ADLs independently. Some concern of underlying depression. Relatively sedentary with limited physical activity, admits to doing no memory exercises. MMSE today 17/30 (prior 23/30 08/2019). Reports recent lab work with PCP back in May, has f/u visit next week. Compliant on Plavix , denies side effects.  Blood pressure today elevated, has not been routinely monitoring at home. PCP d/c'd amlodipine  back in January.  No further concerns at this time.   Update, 01/11/2020 JM: [name] is here today for a stroke/seizure follow-up accompanied by daughter-in-law. Denies new stroke/TIA symptoms. No seizures. No new falls. PT/OT completed, but she does perform exercises occasionally. She continues to use a Rollator walker. She continues to live alone with overnight caregivers - DIL reports clumsiness, and slow with performing some ADLs and requires assistance with IADLs. Remains on Plavix  without side effects, Aspirin  DC by PCP with burst eye vessel and associated black eye (was not previously stopped as Dr. Rosemarie recommended). BP today 132/84. Labs A1c 5.8, LDL 69, and B12 1,201 08/15/2020, has labs scheduled for next week at PCP.  No new concerns at this time  Update 10/11/2020 PS: [name] is a pleasant 89 year old Caucasian lady seen today for consultation visit for TIA.  She is accompanied by her daughter.  History is obtained from them and review of electronic medical records and I personally reviewed pertinent imaging films in PACS.89 year old female with a PMHx of TIA, vitamine B12 deficiency, mild cognitive impairment, hearing loss, hypothyroidism, osteopenia and tremor who presented to the hospital on 08/12/20 after tripping and falling at home, fracturing  her right hip (displaced right intertrochanteric femur fracture). She underwent intramedullary nailing on 8/7 /22 and since then has been able to ambulate. On 08/15/20 morning, she was noted by daughter to suddenly have slurred speech and right facial droop. LKN 11 AM. Code Stroke was called. She was on ASA CT scan of the head showed no acute abnormality with aspect score of 10.  CT angiogram of the brain and neck showed mild less than 50% bilateral carotid bifurcation stenosis with moderate left vertebral artery origin stenosis and moderate right paraclinoid and mild left paraclinoid stenosis.  Echocardiogram showed ejection fraction of 50 to 55% without definite cardiac source of embolism.  LDL cholesterol 69 mg percent hemoglobin A1c was 5.8.  Patient symptoms improved by the time she came back from CT scan.  She was started on dual antiplatelet therapy of aspirin  and Plavix .  Patient has since done well she has been home and getting home physical Occupational Therapy.  She is able to ambulate with a Rollator.  She can walk short distances by herself.  She has had no recurrent TIA or stroke symptoms. Patient has previously been seen in the office and she had an episode on 05/05/2019 was felt to be complex partial seizure versus a TIA.  She also had mild age-related memory loss and cognitive impairment for which she takes Prevagen.  She is fairly independent despite her age lives alone and daughter lives nearby and  provides some help which is mostly supervision.  She has had 2 or 3 falls in the last few years most of these are mechanical.  She denies any progressive memory loss or cognitive impairment or incontinence.  Her MRI scan on 08/16/2020 had shown no acute abnormality but generalized atrophy and white matter changes as well as slight ventriculomegaly out of proportion to the atrophy but this appeared unchanged from previous MRI from 07/02/2019.Tanya [name]   Update 02/28/2020 JM: Mrs. Mamula returns for 57-month  follow-up accompanied by daughter-in-law.   No new or reoccurring stroke/TIA symptoms Denies additional transient speech and AMS episodes Cognition stable without worsening and remains on Prevagen  Reports compliance on aspirin  81 mg daily -denies side effects Blood pressure today 138/78 Recent lipid panel showed LDL 84 (01/13/2020) Remains on B12 for B12 deficiency   Update 08/25/2019 JM: Ms. Lagace is being seen for 6-month follow-up accompanied by her daughter in law.   Previously seen by Dr. Rosemarie for transient speech and altered awareness episode of unknown etiology.   She has not had additional transient episodes or new neurological symptoms Cognition has been stable with MMSE today 23/30 Remains on Prevagen tolerating well Further extensive work-up including EEG, MRI, MRA and lab work completed.   Dementia panel did show B12 deficiency at 185 -advised to follow-up with PCP MR brain w wo contrast showed ventriculomegaly but felt likely chronic with history of meningitis All other work-up unremarkable No concerns at this time   Initial consult visit 06/21/2019 Dr. Rosemarie: Ms. Gilpin is a pleasant 89 year old Caucasian lady seen today for initial office consultation visit. She is accompanied by her daughter-in-law. History is obtained from them and review of referral notes. I have personally reviewed imaging studies in PACS and electronic medical records. Patient states that on 05/05/2019 she and her granddaughter cleaned out some closets in her house and after that she felt tired and wanted to take a nap in a chair. Her other granddaughter called on the phone and when she was given the phone to speak she had trouble speaking and was talking gibberish and garbled speech. She was also not able to comprehend what granddaughter and daughter-in-law spoke to her. Her symptoms are improving gradually took about 30 minutes. She did not go to the hospital but felt okay for the rest of the day. She was a  little slow in thinking for a little while but that improved. She subsequently saw her primary care physician who ordered a CT scan of the head which was done on 05/20/2019 which shows enlarged ventricular size and some periventricular lucencies. 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 There is no acute abnormality. Carotid ultrasound on the same day shows bilateral carotid bifurcation plaques with less than 50% stenosis. Patient denies any known prior history of strokes, TIAs, seizures significant head injury with loss of consciousness. She however does remember his childhood illness possibly diphtheria which she was quite sick and had altered mental status for several days. [place] had to go to another city to get her some medications presumably antibiotics which worked and she gradually got better. She did not have any brain imaging at that time. The patient states she has been having some mild short-term memory difficulties over the years which may be progressing slightly. She does have some sundowning and gets irritated very easily and the daughter-in-law state that she tries to speak to her and get work done mostly  in the morning part of the day. Patient denies any symptoms of gait difficulties gait ataxia falls or injuries. She walks quite well with good balance independently and very rarely needs a cane. She is independent and active daily living and recently has stopped driving. She denies any bladder urgency, frequency or incontinence. She has been referred to see neurosurgeon to discuss abnormal CT scan findings and normal pressure hydrocephalus     ROS:   14 system review of systems is positive for memory loss and all other systems negative  PMH:  Past Medical History:  Diagnosis Date   Hearing loss    Hypothyroidism    Neutropenia (HCC)    Osteopenia    Primary osteoarthritis    Tremor     Social History:  Social  History   Socioeconomic History   Marital status: Widowed    Spouse name: Not on file   Number of children: Not on file   Years of education: Not on file   Highest education level: Not on file  Occupational History   Not on file  Tobacco Use   Smoking status: Never   Smokeless tobacco: Never  Vaping Use   Vaping status: Never Used  Substance and Sexual Activity   Alcohol use: Yes    Alcohol/week: 1.0 standard drink of alcohol    Types: 1 Glasses of wine per week    Comment: bourbon per day one cup prn   Drug use: Not Currently   Sexual activity: Not Currently  Other Topics Concern   Not on file  Social History Narrative   Not on file   Social Drivers of Health   Financial Resource Strain: Not on file  Food Insecurity: Not on file  Transportation Needs: Not on file  Physical Activity: Not on file  Stress: Not on file  Social Connections: Not on file  Intimate Partner Violence: Not on file    Medications:   Current Outpatient Medications on File Prior to Visit  Medication Sig Dispense Refill   amLODipine  (NORVASC ) 2.5 MG tablet Take 1 tablet (2.5 mg total) by mouth every evening. 30 tablet 0   Cholecalciferol (VITAMIN D3) 50 MCG (2000 UT) TABS Take 2,000 Units by mouth daily.     clopidogrel  (PLAVIX ) 75 MG tablet Take 1 tablet (75 mg total) by mouth daily. 30 tablet 0   ferrous sulfate  325 (65 FE) MG tablet Take 1 tablet (325 mg total) by mouth in the morning and at bedtime. 60 tablet 0   levothyroxine  (SYNTHROID ) 112 MCG tablet Take 112 mcg by mouth daily before breakfast.     magnesium  oxide (MAG-OX) 400 MG tablet Take 1 tablet (400 mg total) by mouth daily. 30 tablet 0   memantine  (NAMENDA ) 10 MG tablet Take 1 tablet (10 mg total) by mouth 2 (two) times daily. 180 tablet 3   NON FORMULARY Diet:NAS     traZODone (DESYREL) 50 MG tablet Take 50 mg by mouth at bedtime. Taking 1/2 tablet for sleep.     vitamin B-12 (CYANOCOBALAMIN ) 100 MCG tablet Take 100 mcg by mouth  daily.     No current facility-administered medications on file prior to visit.    Allergies:   Allergies  Allergen Reactions   Codeine Other (See Comments)    Extreme headaches   Shellfish Allergy Hives   Shellfish-Derived Products Hives   Amoxicillin Itching and Rash      Physical Exam Today's Vitals   08/18/23 1508  BP: 120/78  Pulse: 82  Weight: 128 lb 9.6 oz (58.3 kg)  Height: 5' (1.524 m)   Body mass index is 25.12 kg/m.  General: Frail very pleasant elderly Caucasian lady, seated, in no evident distress  Neurologic Exam Mental Status: Awake and fully alert.  Fluent speech and language. Recent memory impaired and remote memory intact. Attention span, concentration and fund of knowledge mildly diminished.  mood and affect appropriate.      08/18/2023    3:11 PM 02/04/2023    3:01 PM 02/04/2022    1:44 PM 07/16/2021    9:47 AM 08/25/2019    2:42 PM  MMSE - Mini Mental State Exam  Orientation to time 3 1 3 1 3   Orientation to Place 3 4 5 2 4   Registration 3 3 3 3 2   Attention/ Calculation 0 0 1 0 5  Recall 2 2 2 3 1   Language- name 2 objects 2 2 2 2 2   Language- repeat 1 0 1 1 1   Language- follow 3 step command 3 3 3 3 3   Language- read & follow direction 1 1 1 1 1   Write a sentence 1 1 1 1 1   Copy design 0 0 1 0 0  Total score 19 17 23 17 23    Cranial Nerves: Pupils equal, briskly reactive to light. Extraocular movements full without nystagmus. Visual fields full to confrontation. Hearing diminished bilaterally despite hearing aid. Facial sensation intact. Face, tongue, palate moves normally and symmetrically.  Motor: Normal bulk and tone. Normal strength in all tested extremity muscles. Sensory.: intact to touch , pinprick , position and vibratory sensation.  Coordination: Rapid alternating movements normal in all extremities. Finger-to-nose and heel-to-shin performed accurately bilaterally. Gait and Station: Arises from chair without difficulty. Stance is  hunched. Gait demonstrates quick short steps with use of RW.       ASSESSMENT/PLAN: 89 year old pleasant Caucasian lady with transient episode of speech and language difficulties with altered awareness in 04/2019 of unclear etiology possibilities include left hemispheric TIA versus complex partial seizure and transient episode of slurred speech and right facial droop in 08/2020. Vascular risk factors of hypertension, intra and extracranial stenosis and age.  She also has mild cognitive impairment.  Her brain scans have shown ventriculomegaly out of proportion to the degree of atrophy but her clinical presentation is not compatible with normal pressure hydrocephalus and prominent ventricles have been documented in her prior imaging studies going as far back as MRI in 2002.     History of stroke/TIA -Continue Plavix  75mg  daily for secondary stroke manage/prescribed by PCP -maintain aggressive risk factor modification with strict control of hypertension with blood pressure goal below 130/90, lipids with LDL cholesterol goal below 70 mg percent and maintain a healthy diet and be active and exercise regularly.    Mild cognitive impairment -Subjectively stable over the past year -MMSE 19/30 (prior 17/30) -Continue Namenda  10 mg twice daily -refill provided -Discussed importance of increasing participation with memory exercises and physical exercises as well as importance of managing vascular risk factors - Continue to follow with PCP for insomnia     Follow up in 8 months or call earlier if needed    CC:  Clarice Nottingham, MD    I personally spent a total of 25 minutes in the care of the patient today including preparing to see the patient, performing a medically appropriate exam/evaluation, counseling and educating, placing orders, and documenting clinical information in the EHR.   Harlene Bogaert, AGNP-BC  [facility] [phone]  [address] [place], [place]  [address]  Phone [phone] Fax [phone] Note: This document was prepared with digital dictation and possible smart phrase technology. Any transcriptional errors that result from this process are unintentional.

## 2023-08-18 ENCOUNTER — Encounter: Payer: Self-pay | Admitting: Adult Health

## 2023-08-18 ENCOUNTER — Ambulatory Visit: Payer: PPO | Admitting: Adult Health

## 2023-08-18 VITALS — BP 120/78 | HR 82 | Ht 60.0 in | Wt 128.6 lb

## 2023-08-18 DIAGNOSIS — G3184 Mild cognitive impairment, so stated: Secondary | ICD-10-CM | POA: Diagnosis not present

## 2023-08-18 DIAGNOSIS — G459 Transient cerebral ischemic attack, unspecified: Secondary | ICD-10-CM

## 2023-08-18 MED ORDER — MEMANTINE HCL 10 MG PO TABS: 10.0000 mg | ORAL_TABLET | Freq: Two times a day (BID) | ORAL | 3 refills | Status: AC

## 2023-08-18 NOTE — Patient Instructions (Addendum)
 Your Plan:  Continue Plavix  for secondary stroke prevention measures  Continue close PCP follow-up for aggressive stroke risk factor management  Continue Namenda  10 mg twice daily for mild cognitive impairment     Follow-up in 8 months or call earlier if needed     Thank you for coming to see us  at [facility]. I hope we have been able to provide you high quality care today.  You may receive a patient satisfaction survey over the next few weeks. We would appreciate your feedback and comments so that we may continue to improve ourselves and the health of our patients.

## 2023-10-21 DIAGNOSIS — M25562 Pain in left knee: Secondary | ICD-10-CM | POA: Diagnosis not present

## 2023-11-20 DIAGNOSIS — H43811 Vitreous degeneration, right eye: Secondary | ICD-10-CM | POA: Diagnosis not present

## 2023-11-20 DIAGNOSIS — Z961 Presence of intraocular lens: Secondary | ICD-10-CM | POA: Diagnosis not present

## 2023-11-20 DIAGNOSIS — H353231 Exudative age-related macular degeneration, bilateral, with active choroidal neovascularization: Secondary | ICD-10-CM | POA: Diagnosis not present

## 2024-01-28 ENCOUNTER — Encounter: Payer: Self-pay | Admitting: Registered Nurse

## 2024-04-19 ENCOUNTER — Ambulatory Visit: Admitting: Adult Health
